# Patient Record
Sex: Female | Born: 1965 | Race: Black or African American | Hispanic: No | Marital: Single | Smoking: Former smoker
Health system: Southern US, Community
[De-identification: ages and names within clinical notes are randomized; demographics above are authoritative.]

## PROBLEM LIST (undated history)

## (undated) DIAGNOSIS — J45909 Unspecified asthma, uncomplicated: Secondary | ICD-10-CM

## (undated) DIAGNOSIS — D219 Benign neoplasm of connective and other soft tissue, unspecified: Secondary | ICD-10-CM

## (undated) DIAGNOSIS — I639 Cerebral infarction, unspecified: Secondary | ICD-10-CM

## (undated) DIAGNOSIS — K219 Gastro-esophageal reflux disease without esophagitis: Secondary | ICD-10-CM

## (undated) DIAGNOSIS — E785 Hyperlipidemia, unspecified: Secondary | ICD-10-CM

## (undated) DIAGNOSIS — D3912 Neoplasm of uncertain behavior of left ovary: Secondary | ICD-10-CM

## (undated) DIAGNOSIS — I10 Essential (primary) hypertension: Secondary | ICD-10-CM

## (undated) DIAGNOSIS — B009 Herpesviral infection, unspecified: Secondary | ICD-10-CM

## (undated) DIAGNOSIS — D649 Anemia, unspecified: Secondary | ICD-10-CM

## (undated) DIAGNOSIS — Z973 Presence of spectacles and contact lenses: Secondary | ICD-10-CM

## (undated) HISTORY — DX: Herpesviral infection, unspecified: B00.9

## (undated) HISTORY — DX: Hyperlipidemia, unspecified: E78.5

## (undated) HISTORY — DX: Anemia, unspecified: D64.9

## (undated) HISTORY — PX: UPPER GASTROINTESTINAL ENDOSCOPY: SHX188

## (undated) HISTORY — DX: Neoplasm of uncertain behavior of left ovary: D39.12

## (undated) HISTORY — DX: Unspecified asthma, uncomplicated: J45.909

## (undated) HISTORY — PX: BREAST BIOPSY: SHX20

## (undated) HISTORY — PX: TUBAL LIGATION: SHX77

---

## 1989-08-17 HISTORY — PX: TUBAL LIGATION: SHX77

## 2001-12-23 ENCOUNTER — Emergency Department (HOSPITAL_COMMUNITY): Admission: EM | Admit: 2001-12-23 | Discharge: 2001-12-23 | Payer: Self-pay | Admitting: Emergency Medicine

## 2007-10-31 ENCOUNTER — Emergency Department (HOSPITAL_COMMUNITY): Admission: EM | Admit: 2007-10-31 | Discharge: 2007-10-31 | Payer: Self-pay | Admitting: Emergency Medicine

## 2010-06-13 ENCOUNTER — Emergency Department (HOSPITAL_BASED_OUTPATIENT_CLINIC_OR_DEPARTMENT_OTHER)
Admission: EM | Admit: 2010-06-13 | Discharge: 2010-06-13 | Payer: Self-pay | Source: Home / Self Care | Admitting: Emergency Medicine

## 2010-06-13 ENCOUNTER — Ambulatory Visit: Payer: Self-pay | Admitting: Diagnostic Radiology

## 2010-08-17 DIAGNOSIS — D249 Benign neoplasm of unspecified breast: Secondary | ICD-10-CM | POA: Insufficient documentation

## 2010-09-07 ENCOUNTER — Encounter: Payer: Self-pay | Admitting: Obstetrics and Gynecology

## 2010-10-29 LAB — CBC
MCH: 27.8 pg (ref 26.0–34.0)
MCHC: 33.5 g/dL (ref 30.0–36.0)
MCV: 83 fL (ref 78.0–100.0)
Platelets: 379 10*3/uL (ref 150–400)
RBC: 4.31 MIL/uL (ref 3.87–5.11)
RDW: 15.7 % — ABNORMAL HIGH (ref 11.5–15.5)

## 2010-10-29 LAB — COMPREHENSIVE METABOLIC PANEL
Albumin: 4.3 g/dL (ref 3.5–5.2)
BUN: 10 mg/dL (ref 6–23)
Calcium: 9.6 mg/dL (ref 8.4–10.5)
Chloride: 107 mEq/L (ref 96–112)
Creatinine, Ser: 0.7 mg/dL (ref 0.4–1.2)
GFR calc Af Amer: >60 mL/min (ref >60)
Total Bilirubin: 0.3 mg/dL (ref 0.3–1.2)

## 2010-10-29 LAB — POCT CARDIAC MARKERS
CKMB, poc: <1 ng/mL — ABNORMAL LOW (ref 1.0–8.0)
Myoglobin, poc: 16.3 ng/mL (ref 12–200)

## 2010-10-29 LAB — DIFFERENTIAL
Basophils Absolute: 0 10*3/uL (ref 0.0–0.1)
Lymphocytes Relative: 36 % (ref 12–46)
Lymphs Abs: 3.5 10*3/uL (ref 0.7–4.0)
Monocytes Absolute: 0.5 10*3/uL (ref 0.1–1.0)
Neutro Abs: 5.9 10*3/uL (ref 1.7–7.7)

## 2010-10-29 LAB — D-DIMER, QUANTITATIVE: D-Dimer, Quant: 0.35 ug/mL-FEU (ref 0.00–0.48)

## 2011-05-06 ENCOUNTER — Other Ambulatory Visit: Payer: Self-pay | Admitting: Obstetrics and Gynecology

## 2011-05-06 DIAGNOSIS — R928 Other abnormal and inconclusive findings on diagnostic imaging of breast: Secondary | ICD-10-CM

## 2011-05-11 LAB — DIFFERENTIAL
Basophils Absolute: 0
Basophils Relative: 1
Eosinophils Absolute: 0
Neutrophils Relative %: 67

## 2011-05-11 LAB — BASIC METABOLIC PANEL
CO2: 24
Calcium: 8.9
Creatinine, Ser: 0.63
GFR calc non Af Amer: >60
Glucose, Bld: 85

## 2011-05-11 LAB — CBC
MCHC: 30.5
Platelets: 350
RDW: 18.9 — ABNORMAL HIGH

## 2011-05-15 ENCOUNTER — Ambulatory Visit
Admission: RE | Admit: 2011-05-15 | Discharge: 2011-05-15 | Disposition: A | Discharge status: Home / Self Care | Payer: PRIVATE HEALTH INSURANCE | Source: Ambulatory Visit | Attending: Obstetrics and Gynecology | Admitting: Obstetrics and Gynecology

## 2011-05-15 DIAGNOSIS — R928 Other abnormal and inconclusive findings on diagnostic imaging of breast: Secondary | ICD-10-CM

## 2011-12-25 ENCOUNTER — Other Ambulatory Visit: Payer: Self-pay | Admitting: Obstetrics and Gynecology

## 2011-12-25 DIAGNOSIS — N63 Unspecified lump in unspecified breast: Secondary | ICD-10-CM

## 2011-12-31 ENCOUNTER — Other Ambulatory Visit: Payer: Self-pay | Admitting: Obstetrics and Gynecology

## 2011-12-31 ENCOUNTER — Ambulatory Visit
Admission: RE | Admit: 2011-12-31 | Discharge: 2011-12-31 | Disposition: A | Discharge status: Home / Self Care | Payer: PRIVATE HEALTH INSURANCE | Source: Ambulatory Visit | Attending: Obstetrics and Gynecology | Admitting: Obstetrics and Gynecology

## 2011-12-31 DIAGNOSIS — N63 Unspecified lump in unspecified breast: Secondary | ICD-10-CM

## 2012-01-06 ENCOUNTER — Other Ambulatory Visit: Payer: Self-pay | Admitting: Obstetrics and Gynecology

## 2012-01-06 ENCOUNTER — Ambulatory Visit
Admission: RE | Admit: 2012-01-06 | Discharge: 2012-01-06 | Disposition: A | Discharge status: Home / Self Care | Payer: PRIVATE HEALTH INSURANCE | Source: Ambulatory Visit | Attending: Obstetrics and Gynecology | Admitting: Obstetrics and Gynecology

## 2012-01-06 DIAGNOSIS — N63 Unspecified lump in unspecified breast: Secondary | ICD-10-CM

## 2012-05-20 ENCOUNTER — Other Ambulatory Visit: Payer: Self-pay

## 2014-04-26 ENCOUNTER — Encounter (HOSPITAL_COMMUNITY): Payer: Self-pay | Admitting: Emergency Medicine

## 2014-04-26 ENCOUNTER — Emergency Department (HOSPITAL_COMMUNITY)
Admission: EM | Admit: 2014-04-26 | Discharge: 2014-04-26 | Disposition: A | Discharge status: Other Acute Inpatient Hospital | Payer: Commercial Managed Care - PPO | Source: Home / Self Care | Attending: Family Medicine | Admitting: Family Medicine

## 2014-04-26 ENCOUNTER — Emergency Department (HOSPITAL_COMMUNITY)
Admission: EM | Admit: 2014-04-26 | Discharge: 2014-04-26 | Disposition: A | Discharge status: Home / Self Care | Payer: Commercial Managed Care - PPO | Attending: Emergency Medicine | Admitting: Emergency Medicine

## 2014-04-26 DIAGNOSIS — R0789 Other chest pain: Secondary | ICD-10-CM

## 2014-04-26 DIAGNOSIS — D649 Anemia, unspecified: Secondary | ICD-10-CM | POA: Insufficient documentation

## 2014-04-26 DIAGNOSIS — R42 Dizziness and giddiness: Secondary | ICD-10-CM | POA: Diagnosis not present

## 2014-04-26 DIAGNOSIS — R062 Wheezing: Secondary | ICD-10-CM | POA: Insufficient documentation

## 2014-04-26 DIAGNOSIS — Z791 Long term (current) use of non-steroidal anti-inflammatories (NSAID): Secondary | ICD-10-CM | POA: Diagnosis not present

## 2014-04-26 DIAGNOSIS — I10 Essential (primary) hypertension: Secondary | ICD-10-CM | POA: Diagnosis not present

## 2014-04-26 DIAGNOSIS — F17200 Nicotine dependence, unspecified, uncomplicated: Secondary | ICD-10-CM | POA: Insufficient documentation

## 2014-04-26 DIAGNOSIS — Z8542 Personal history of malignant neoplasm of other parts of uterus: Secondary | ICD-10-CM | POA: Diagnosis not present

## 2014-04-26 HISTORY — DX: Benign neoplasm of connective and other soft tissue, unspecified: D21.9

## 2014-04-26 LAB — COMPREHENSIVE METABOLIC PANEL
ALT: 10 U/L (ref 0–35)
AST: 17 U/L (ref 0–37)
Albumin: 3.9 g/dL (ref 3.5–5.2)
Alkaline Phosphatase: 43 U/L (ref 39–117)
Anion gap: 16 — ABNORMAL HIGH (ref 5–15)
BUN: 7 mg/dL (ref 6–23)
CO2: 20 mEq/L (ref 19–32)
Calcium: 8.8 mg/dL (ref 8.4–10.5)
Chloride: 104 mEq/L (ref 96–112)
Creatinine, Ser: 0.45 mg/dL — ABNORMAL LOW (ref 0.50–1.10)
GFR calc Af Amer: >90 mL/min (ref >90)
GFR calc non Af Amer: >90 mL/min (ref >90)
Glucose, Bld: 86 mg/dL (ref 70–99)
Potassium: 3.6 mEq/L — ABNORMAL LOW (ref 3.7–5.3)
Sodium: 140 mEq/L (ref 137–147)
Total Bilirubin: 0.2 mg/dL — ABNORMAL LOW (ref 0.3–1.2)
Total Protein: 7.5 g/dL (ref 6.0–8.3)

## 2014-04-26 LAB — URINALYSIS, ROUTINE W REFLEX MICROSCOPIC
Bilirubin Urine: NEGATIVE
Glucose, UA: NEGATIVE mg/dL
Ketones, ur: 15 mg/dL — AB
Nitrite: NEGATIVE
Protein, ur: NEGATIVE mg/dL
Specific Gravity, Urine: 1.012 (ref 1.005–1.030)
Urobilinogen, UA: 0.2 mg/dL (ref 0.0–1.0)
pH: 6 (ref 5.0–8.0)

## 2014-04-26 LAB — POCT I-STAT, CHEM 8
BUN: 6 mg/dL (ref 6–23)
CHLORIDE: 106 meq/L (ref 96–112)
Calcium, Ion: 1.14 mmol/L (ref 1.12–1.23)
Creatinine, Ser: 0.5 mg/dL (ref 0.50–1.10)
GLUCOSE: 99 mg/dL (ref 70–99)
HEMATOCRIT: 26 % — AB (ref 36.0–46.0)
Hemoglobin: 8.8 g/dL — ABNORMAL LOW (ref 12.0–15.0)
POTASSIUM: 3.6 meq/L — AB (ref 3.7–5.3)
Sodium: 135 mEq/L — ABNORMAL LOW (ref 137–147)
TCO2: 22 mmol/L (ref 0–100)

## 2014-04-26 LAB — CBC WITH DIFFERENTIAL/PLATELET
Basophils Absolute: 0.1 10*3/uL (ref 0.0–0.1)
Basophils Relative: 1 % (ref 0–1)
Eosinophils Absolute: 0.1 10*3/uL (ref 0.0–0.7)
Eosinophils Relative: 1 % (ref 0–5)
HCT: 26.6 % — ABNORMAL LOW (ref 36.0–46.0)
Hemoglobin: 7.3 g/dL — ABNORMAL LOW (ref 12.0–15.0)
Lymphocytes Relative: 29 % (ref 12–46)
Lymphs Abs: 2.4 10*3/uL (ref 0.7–4.0)
MCH: 18 pg — ABNORMAL LOW (ref 26.0–34.0)
MCHC: 27.4 g/dL — ABNORMAL LOW (ref 30.0–36.0)
MCV: 65.5 fL — ABNORMAL LOW (ref 78.0–100.0)
Monocytes Absolute: 0.4 10*3/uL (ref 0.1–1.0)
Monocytes Relative: 5 % (ref 3–12)
Neutro Abs: 5.3 10*3/uL (ref 1.7–7.7)
Neutrophils Relative %: 64 % (ref 43–77)
Platelets: 243 10*3/uL (ref 150–400)
RBC: 4.06 MIL/uL (ref 3.87–5.11)
RDW: 18.6 % — ABNORMAL HIGH (ref 11.5–15.5)
WBC: 8.3 10*3/uL (ref 4.0–10.5)

## 2014-04-26 LAB — POCT URINALYSIS DIP (DEVICE)
Bilirubin Urine: NEGATIVE
GLUCOSE, UA: NEGATIVE mg/dL
KETONES UR: NEGATIVE mg/dL
Nitrite: NEGATIVE
Protein, ur: NEGATIVE mg/dL
SPECIFIC GRAVITY, URINE: 1.02 (ref 1.005–1.030)
Urobilinogen, UA: 0.2 mg/dL (ref 0.0–1.0)
pH: 6 (ref 5.0–8.0)

## 2014-04-26 LAB — URINE MICROSCOPIC-ADD ON

## 2014-04-26 MED ORDER — MECLIZINE HCL 25 MG PO TABS
25.0000 mg | ORAL_TABLET | Freq: Once | ORAL | Status: AC
  Administered 2014-04-26: 25 mg via ORAL
  Filled 2014-04-26: qty 1

## 2014-04-26 MED ORDER — FERROUS SULFATE 325 (65 FE) MG PO TABS: 325.0000 mg | ORAL_TABLET | Freq: Every day | ORAL | Status: DC

## 2014-04-26 MED ORDER — MECLIZINE HCL 25 MG PO TABS: 25.0000 mg | ORAL_TABLET | Freq: Three times a day (TID) | ORAL | Status: DC | PRN

## 2014-04-26 MED ORDER — SODIUM CHLORIDE 0.9 % IV BOLUS (SEPSIS)
1000.0000 mL | Freq: Once | INTRAVENOUS | Status: AC
  Administered 2014-04-26: 1000 mL via INTRAVENOUS

## 2014-04-26 NOTE — ED Provider Notes (Signed)
Medical screening examination/treatment/procedure(s) were conducted as a shared visit with non-physician practitioner(s) and myself.  I personally evaluated the patient during the encounter.   EKG Interpretation None      Pt is a 48 y.o. female who presents emergency department with vertigo and mild shortness of breath. She is found to be anemic with a hemoglobin of 7.3. Likely secondary to heavy menstrual cycles because of fibroids. She is not having active bleeding. No bloody stool or melena. She is hemodynamically stable and asymptomatic in the ED. discuss with her the options of transfusion versus close followup and starting her on iron tablets. She does not want blood products at this time and would prefer outpatient followup. Discussed strict return precautions. I feel this is a reasonable plan and she is hemodynamically stable, well-appearing, nontoxic, no active bleeding. She is not on anticoagulation.  [place], DO 04/26/14 2025

## 2014-04-26 NOTE — ED Notes (Signed)
Provider at bedside

## 2014-04-26 NOTE — Discharge Instructions (Signed)
Return here as needed.  Follow-up with the clinic provided.  °

## 2014-04-26 NOTE — ED Provider Notes (Signed)
CSN: [id number]     Arrival date & time 04/26/14  1221 History   First MD Initiated Contact with Patient 04/26/14 1548     Chief Complaint  Patient presents with  . Anemia  . Dizziness   HPI 48 year old female with a history of anemia and untreated borderline HTN presents from Urgent Care for evaluation of dizziness and vertigo. Reports symptoms started 3 days ago upon waking up, described as "room spinning", feeling unbalanced, and mild nausea. Symptoms occur intermittently, especially with position changes of sitting up or getting up, at least once per hour and have been increasing in intensity. Accompanied by moderate, occasional chest pain that moves from right to left, mild SOB and palpitations, blurry vision and black spots. Reports vomiting once yesterday, but denies any hematemesis. Also endorses fatigue in the recent months and occasional lightheadedness. Denies any edema, diaphoresis, fever, or change in headaches. Treated the dizziness with sudafed and ibuprofen, without any relief. Chronic medical conditions include mild (per patient) hypertension that is not treated, and history of anemia (no CBC checked in the past 2 years). LMP on 08/21. Admits to smoking for 3 years and daily alcohol intake of 12 oz of beer. Denies any recreational drug use.  Past Medical History  Diagnosis Date  . Fibroids    History reviewed. No pertinent past surgical history. History reviewed. No pertinent family history. History  Substance Use Topics  . Smoking status: Current Every Day Smoker  . Smokeless tobacco: Not on file  . Alcohol Use: Yes   OB History   Grav Para Term Preterm Abortions TAB SAB Ect Mult Living                 Review of Systems  Review of systems is negative except as noted in HPI.   Allergies  Review of patient's allergies indicates no known allergies.  Home Medications   Prior to Admission medications   Medication Sig Start Date End Date Taking? Authorizing Provider   Ibuprofen-Diphenhydramine HCl (ADVIL PM) 200-25 MG CAPS Take by mouth.   Yes Historical Provider, MD  pseudoephedrine (SUDAFED) 30 MG tablet Take 30 mg by mouth every 4 (four) hours as needed for congestion.   Yes Historical Provider, MD   BP 152/89  Pulse 77  Temp(Src) 98.1 F (36.7 C) (Oral)  Resp 18  SpO2 100%  LMP 04/11/2014 Physical Exam  Nursing note and vitals reviewed. Constitutional: She is oriented to person, place, and time. She appears well-developed and well-nourished. No distress.  HENT:  Head: Normocephalic and atraumatic.  Mouth/Throat: Oropharynx is clear and moist.  Eyes: EOM are normal. Pupils are equal, round, and reactive to light.  Neck: Neck supple. No JVD present.  Cardiovascular: Normal rate, regular rhythm and intact distal pulses.  Exam reveals no gallop and no friction rub.   No murmur heard. No peripheral edema.   Pulmonary/Chest: Effort normal. She has wheezes in the right lower field and the left lower field.  Soft inspiratory wheezes lower lung fields bilaterally.   Lymphadenopathy:    She has no cervical adenopathy.  Neurological: She is alert and oriented to person, place, and time. No cranial nerve deficit.  Skin: Skin is warm and dry. She is not diaphoretic.    ED Course  Procedures (including critical care time) Labs Review Labs Reviewed  URINE CULTURE  CBC WITH DIFFERENTIAL  COMPREHENSIVE METABOLIC PANEL  URINALYSIS, ROUTINE W REFLEX MICROSCOPIC    The patient would like to be discharged home  at this time.  She does not want to be admitted to the hospital.  I feel that her vertigo is not definitely related to her anemia, but certainly could be related and I advised her of this.  Patient still like to go home and followup on outpatient basis.  Patient agrees with plan and all questions were answered.  Advised the patient to return here for worsening in her condition and she voices an understanding and agrees to that plan as  well  Brent General, PA-C 04/26/14 2058

## 2014-04-26 NOTE — ED Notes (Signed)
Pt    Reports   Symptoms  Of  Dizzy     No  Pain        Vomited  X  1  Yesterday            Sitting upright  On the  Exam table  Speaking  In  Complete  sentances

## 2014-04-26 NOTE — ED Provider Notes (Signed)
CSN: [id number]     Arrival date & time 04/26/14  1006 History   First MD Initiated Contact with Patient 04/26/14 1029     Chief Complaint  Patient presents with  . Dizziness   (Consider location/radiation/quality/duration/timing/severity/associated sxs/prior Treatment) HPI Comments: Patient reports brief episodes of dizziness with symptoms described as room spinning with changes in head position. States symptoms are most noticeable when transitioning from lying flat to sitting, from sitting to standing or is she bends over to pick up object off the floor. Denies GU or GI sx, including melena.  LNMP: 04/06/2014 PCP: none GYN: none Is a smoker Works as Company secretary at [employer] Also mentions occasional episodes (1-2 x week) of left chest discomfort that radiates to her left upper back and is associated with mild dyspnea and a sense of heartburn. States episodes of hearburn have become more frequent over past few weeks. Denies nausea or diaphoresis. These episodes often last several hours. Last episode was 1-2 days ago.  No family hx of early heart disease. At the time of today's hx and exam, patient is without dizziness or chest discomfort.     Patient is a 48 y.o. female presenting with dizziness. The history is provided by the patient.  Dizziness Quality:  Room spinning Severity:  Mild Onset quality:  Gradual Duration:  3 days Timing:  Intermittent Progression:  Waxing and waning Chronicity:  New Context: bending over, head movement and standing up   Context: not with bowel movement, not with ear pain, not with eye movement, not with inactivity, not with loss of consciousness, not with medication, not with physical activity and not when urinating   Relieved by:  Being still Worsened by:  Standing up and sitting upright Ineffective treatments:  None tried Associated symptoms: chest pain and shortness of breath   Associated symptoms: no blood in stool, no diarrhea, no  headaches, no hearing loss, no nausea, no palpitations, no syncope, no tinnitus, no vision changes, no vomiting and no weakness   Associated symptoms comment:  +heavy menses secondary to uterine fibroids   History reviewed. No pertinent past medical history. History reviewed. No pertinent past surgical history. History reviewed. No pertinent family history. History  Substance Use Topics  . Smoking status: Never Smoker   . Smokeless tobacco: Not on file  . Alcohol Use: Yes   OB History   Grav Para Term Preterm Abortions TAB SAB Ect Mult Living                 Review of Systems  Constitutional: Negative.   HENT: Negative for hearing loss and tinnitus.   Eyes: Negative.   Respiratory: Positive for chest tightness and shortness of breath.   Cardiovascular: Positive for chest pain. Negative for palpitations and syncope.  Gastrointestinal: Negative for nausea, vomiting, diarrhea and blood in stool.  Endocrine: Negative for polydipsia, polyphagia and polyuria.  Genitourinary: Negative.   Musculoskeletal: Negative.   Skin: Negative.   Neurological: Positive for dizziness and light-headedness. Negative for tremors, seizures, syncope, facial asymmetry, speech difficulty, weakness, numbness and headaches.  Hematological: Negative.   Psychiatric/Behavioral: Negative.     Allergies  Review of patient's allergies indicates no known allergies.  Home Medications   Prior to Admission medications   Not on File   BP 157/95  Pulse 90  Temp(Src) 98.7 F (37.1 C) (Oral)  Resp 16  SpO2 100%  LMP 04/11/2014 Physical Exam  Nursing note and vitals reviewed. Constitutional: She is oriented to person, place,  and time. She appears well-developed and well-nourished. No distress.  HENT:  Head: Normocephalic and atraumatic.  Right Ear: Hearing, tympanic membrane, external ear and ear canal normal.  Left Ear: Hearing, tympanic membrane, external ear and ear canal normal.  Mouth/Throat:  Oropharynx is clear and moist.  Eyes: Conjunctivae are normal. No scleral icterus.  Cardiovascular: Normal rate, regular rhythm and normal heart sounds.   No murmur heard. Pulmonary/Chest: Effort normal and breath sounds normal.  Abdominal: Soft. Bowel sounds are normal. She exhibits no distension. There is no tenderness.  Musculoskeletal: Normal range of motion.  Neurological: She is alert and oriented to person, place, and time. She has normal strength. No cranial nerve deficit or sensory deficit. Coordination and gait normal. GCS eye subscore is 4. GCS verbal subscore is 5. GCS motor subscore is 6.  Skin: Skin is warm and dry. No rash noted. She is not diaphoretic. No erythema.  Psychiatric: She has a normal mood and affect. Her behavior is normal.    ED Course  Procedures (including critical care time) Labs Review Labs Reviewed  POCT I-STAT, CHEM 8 - Abnormal; Notable for the following:    Sodium 135 (*)    Potassium 3.6 (*)    Hemoglobin 8.8 (*)    HCT 26.0 (*)    All other components within normal limits  POCT URINALYSIS DIP (DEVICE) - Abnormal; Notable for the following:    Hgb urine dipstick TRACE (*)    Leukocytes, UA SMALL (*)    All other components within normal limits    Imaging Review No results found.   MDM   1. Symptomatic anemia   2. Chest discomfort    48 y/o AA female with symptomatic anemia and complaints of intermittent chest discomfort and hx of HTN, tobacco abuse.  ECG: NSR @ 89 bpm and without acute ST/T wave changes or ectopy.  UA with trace LE and trace HGb Istat 8 with moderate anemia (H/H=8.8/26)??result of heavy menses?? Unfortunately this patient is not established with primary care or Gyn providers in community and I am concerned about access to prompt outpatient follow up for further investigation of anemia and completion of risk stratification with regard to issues of chest discomfort. I have some concern that anemia may be contributing to  chest discomfort. Therefore, after discussing case with Dr. Georgina Snell, decision to transfer patient (via shuttle) to [facility] to complete basic cardiopulmonary evaluation (??CXR, ??cardiac markers) and determine if patient requires transfusion vs outpatient iron therapy.       Lutricia Feil, [place] 04/26/14 1224

## 2014-04-26 NOTE — ED Notes (Signed)
Pt sent here from [facility] for further eval of dizziness x 3 days and anemia; pt sts hx of same with fibroids

## 2014-04-26 NOTE — ED Notes (Signed)
Pt ambulated in the hallway without any c/o dizziness or SOB

## 2014-04-26 NOTE — ED Provider Notes (Signed)
Medical screening examination/treatment/procedure(s) were performed by non-physician practitioner and as supervising physician I was immediately available for consultation/collaboration.   EKG Interpretation None        [place], DO 04/26/14 2334

## 2014-04-27 LAB — URINE CULTURE: Colony Count: 50000

## 2014-04-28 NOTE — ED Provider Notes (Signed)
Medical screening examination/treatment/procedure(s) were performed by a resident physician or non-physician practitioner and as the supervising physician I was immediately available for consultation/collaboration.  Lynne Leader, MD    Gregor Hams, MD 04/28/14 [phone]

## 2015-06-04 ENCOUNTER — Emergency Department (HOSPITAL_BASED_OUTPATIENT_CLINIC_OR_DEPARTMENT_OTHER): Payer: Commercial Managed Care - PPO

## 2015-06-04 ENCOUNTER — Emergency Department (HOSPITAL_BASED_OUTPATIENT_CLINIC_OR_DEPARTMENT_OTHER)
Admission: EM | Admit: 2015-06-04 | Discharge: 2015-06-04 | Disposition: A | Discharge status: Home / Self Care | Payer: Commercial Managed Care - PPO | Attending: Emergency Medicine | Admitting: Emergency Medicine

## 2015-06-04 DIAGNOSIS — R0602 Shortness of breath: Secondary | ICD-10-CM | POA: Insufficient documentation

## 2015-06-04 DIAGNOSIS — Z79899 Other long term (current) drug therapy: Secondary | ICD-10-CM | POA: Insufficient documentation

## 2015-06-04 DIAGNOSIS — D649 Anemia, unspecified: Secondary | ICD-10-CM | POA: Insufficient documentation

## 2015-06-04 DIAGNOSIS — Z720 Tobacco use: Secondary | ICD-10-CM | POA: Insufficient documentation

## 2015-06-04 DIAGNOSIS — Z86018 Personal history of other benign neoplasm: Secondary | ICD-10-CM | POA: Insufficient documentation

## 2015-06-04 DIAGNOSIS — R0789 Other chest pain: Secondary | ICD-10-CM | POA: Insufficient documentation

## 2015-06-04 LAB — CBC WITH DIFFERENTIAL/PLATELET
BASOS PCT: 0 %
Basophils Absolute: 0 10*3/uL (ref 0.0–0.1)
Eosinophils Absolute: 0 10*3/uL (ref 0.0–0.7)
Eosinophils Relative: 0 %
HEMATOCRIT: 35.2 % — AB (ref 36.0–46.0)
Hemoglobin: 11.9 g/dL — ABNORMAL LOW (ref 12.0–15.0)
Lymphocytes Relative: 16 %
Lymphs Abs: 1.7 10*3/uL (ref 0.7–4.0)
MCH: 30.6 pg (ref 26.0–34.0)
MCHC: 33.8 g/dL (ref 30.0–36.0)
MCV: 90.5 fL (ref 78.0–100.0)
Monocytes Absolute: 0.5 10*3/uL (ref 0.1–1.0)
Monocytes Relative: 5 %
NEUTROS ABS: 8.2 10*3/uL — AB (ref 1.7–7.7)
NEUTROS PCT: 79 %
PLATELETS: 268 10*3/uL (ref 150–400)
RBC: 3.89 MIL/uL (ref 3.87–5.11)
RDW: 12.9 % (ref 11.5–15.5)
WBC: 10.4 10*3/uL (ref 4.0–10.5)

## 2015-06-04 LAB — BASIC METABOLIC PANEL
Anion gap: 8 (ref 5–15)
BUN: 9 mg/dL (ref 6–20)
CO2: 25 mmol/L (ref 22–32)
Calcium: 9.1 mg/dL (ref 8.9–10.3)
Chloride: 104 mmol/L (ref 101–111)
Creatinine, Ser: 0.52 mg/dL (ref 0.44–1.00)
GFR calc Af Amer: >60 mL/min (ref >60)
Glucose, Bld: 103 mg/dL — ABNORMAL HIGH (ref 65–99)
POTASSIUM: 3.2 mmol/L — AB (ref 3.5–5.1)
Sodium: 137 mmol/L (ref 135–145)

## 2015-06-04 LAB — TROPONIN I: Troponin I: <0.03 ng/mL (ref <0.031)

## 2015-06-04 MED ORDER — ALBUTEROL SULFATE HFA 108 (90 BASE) MCG/ACT IN AERS
1.0000 | INHALATION_SPRAY | Freq: Four times a day (QID) | RESPIRATORY_TRACT | Status: DC | PRN
  Administered 2015-06-04: 1 via RESPIRATORY_TRACT
  Filled 2015-06-04: qty 6.7

## 2015-06-04 NOTE — ED Notes (Signed)
Pt reports chest tightness that has been on going for several month with symptoms that are more frequent and increasing tightness with last episode tonight at work.

## 2015-06-04 NOTE — ED Provider Notes (Signed)
CSN: [id number]     Arrival date & time 06/04/15  4742 History   First MD Initiated Contact with Patient 06/04/15 0703     Chief Complaint  Patient presents with  . Chest Pain    tightness     (Consider location/radiation/quality/duration/timing/severity/associated sxs/prior Treatment) HPI Comments: Patient is a 49 year old female with a history of anemia due to uterine fibroids who is currently on iron and recently stopped smoking approximately 2 months ago who presents today with ongoing intermittent chest tightness and shortness of breath. This has been going on for the last 2 months and gradually worsening. It seems to be worse with activity and exertion and better with sitting down and resting. She describes it as a tightness throughout the central part of the chest without radiation that can last anywhere between 2 and 15 minutes. Sometimes she gets shortness of breath with it but not always. No vomiting or diaphoresis. Patient is not currently having pain. She decided to come in today because she felt like the tightness was worse at work last night than normal. She also notes today that she might be coming down with a cold that she feels more congested and has a slight cough. However over the last few months she denies cold-like symptoms. No prior history of cardiac issues. No family history of heart disease or MI. Patient denies any recent immobilization, surgeries or long travel. No unilateral leg pain or swelling  Patient is a 49 y.o. female presenting with chest pain. The history is provided by the patient.  Chest Pain   Past Medical History  Diagnosis Date  . Fibroids    No past surgical history on file. No family history on file. Social History  Substance Use Topics  . Smoking status: Current Every Day Smoker  . Smokeless tobacco: Not on file  . Alcohol Use: Yes   OB History    No data available     Review of Systems  Cardiovascular: Positive for chest pain.  All  other systems reviewed and are negative.     Allergies  Review of patient's allergies indicates no known allergies.  Home Medications   Prior to Admission medications   Medication Sig Start Date End Date Taking? Authorizing Provider  ferrous sulfate 325 (65 FE) MG tablet Take 1 tablet (325 mg total) by mouth daily. 04/26/14   Christopher Lawyer, PA-C  Ibuprofen-Diphenhydramine HCl (ADVIL PM) 200-25 MG CAPS Take by mouth.    Historical Provider, MD  meclizine (ANTIVERT) 25 MG tablet Take 1 tablet (25 mg total) by mouth 3 (three) times daily as needed for dizziness. 04/26/14   Dalia Heading, PA-C  pseudoephedrine (SUDAFED) 30 MG tablet Take 30 mg by mouth every 4 (four) hours as needed for congestion.    Historical Provider, MD   BP 159/87 mmHg  Pulse 71  Temp(Src) 98.4 F (36.9 C) (Oral)  Resp 17  SpO2 100%  LMP 05/14/2015 Physical Exam  Constitutional: She is oriented to person, place, and time. She appears well-developed and well-nourished. No distress.  HENT:  Head: Normocephalic and atraumatic.  Mouth/Throat: Oropharynx is clear and moist.  Eyes: Conjunctivae and EOM are normal. Pupils are equal, round, and reactive to light.  Neck: Normal range of motion. Neck supple.  Cardiovascular: Normal rate, regular rhythm and intact distal pulses.   No murmur heard. Pulmonary/Chest: Effort normal and breath sounds normal. No respiratory distress. She has no wheezes. She has no rales. She exhibits no tenderness.  Abdominal: Soft. She exhibits  no distension. There is no tenderness. There is no rebound and no guarding.  Musculoskeletal: Normal range of motion. She exhibits no edema or tenderness.  No calf tenderness bilaterally or swelling  Neurological: She is alert and oriented to person, place, and time.  Skin: Skin is warm and dry. No rash noted. No erythema.  Psychiatric: She has a normal mood and affect. Her behavior is normal.  Nursing note and vitals reviewed.   ED Course   Procedures (including critical care time) Labs Review Labs Reviewed  CBC WITH DIFFERENTIAL/PLATELET - Abnormal; Notable for the following:    Hemoglobin 11.9 (*)    HCT 35.2 (*)    Neutro Abs 8.2 (*)    All other components within normal limits  BASIC METABOLIC PANEL - Abnormal; Notable for the following:    Potassium 3.2 (*)    Glucose, Bld 103 (*)    All other components within normal limits  TROPONIN I    Imaging Review Dg Chest 2 View  06/04/2015  CLINICAL DATA:  Chest pain and short of breath EXAM: CHEST  2 VIEW COMPARISON:  06/13/2010 FINDINGS: The heart size and mediastinal contours are within normal limits. Both lungs are clear. The visualized skeletal structures are unremarkable. IMPRESSION: No active cardiopulmonary disease. Electronically Signed   By: Franchot Gallo M.D.   On: 06/04/2015 07:50   I have personally reviewed and evaluated these images and lab results as part of my medical decision-making.   EKG Interpretation   Date/Time:  Tuesday June 04 2015 06:42:07 EDT Ventricular Rate:  73 PR Interval:  160 QRS Duration: 68 QT Interval:  382 QTC Calculation: 420 R Axis:   12 Text Interpretation:  Normal sinus rhythm Anterior infarct , age  undetermined Abnormal ECG No significant change since Confirmed by MOLPUS   MD, Jenny Reichmann ([id number]) on 06/04/2015 6:46:43 AM      MDM   Final diagnoses:  Atypical chest pain    Patient is a 49 year old female presenting today for chest tightness that's been intermittent for the last 2 months with associated shortness of breath. Patient was a smoker however quit 2 months ago but feels that her symptoms are worsening. She has no prior cardiac history and no family history of cardiac disease. Her only risk factor is smoking. She does have a doctor but has not seen them in about a year and a half. She denies any history of hypertension, hyperlipidemia, diabetes.  She does have a former history of asthma but has not had to use an  inhaler in quite some time.  Today she is complaining of some mild URI symptoms however those have not been present over the last few months when she develops the chest tightness. Lungs are clear on exam today with no focal findings on exam.  Given patient's story concerning for possible cardiac etiology versus lung pathology such as early COPD from prior tobacco use and history of asthma vs gerd as some sx do occur if she eats a big meal and then lays down vs anemia.  Lower suspicion for pneumonia. Perc negative and HEART score of 1 for age (or 2 if you count risk factor of smoking due to recently quitting 2 months ago) which places the patient in a fairly low risk category.  EKG without acute findings and unchanged from prior. CBC, BMP, troponin, chest x-ray pending  8:28 AM Labs are without acute finding. Patient's anemia significantly improved. Troponin is negative. Discussed with patient the importance of following with  her PCP for stress testing and possible pulmonary function tests. I suspect this is most likely related more to lung pathology opposed to cardiac issues. Patient was given an inhaler here to use when she develops the tightness and shortness of breath to see if there is any improvement. Also she was given recommendations for diet changes and lifestyle changes to decrease GERD symptoms  Blanchie Dessert, MD 06/04/15 [phone]

## 2015-10-01 LAB — HM MAMMOGRAPHY

## 2015-10-15 ENCOUNTER — Encounter: Payer: Self-pay | Admitting: Family Medicine

## 2015-10-15 ENCOUNTER — Ambulatory Visit (INDEPENDENT_AMBULATORY_CARE_PROVIDER_SITE_OTHER): Payer: BLUE CROSS/BLUE SHIELD | Admitting: Family Medicine

## 2015-10-15 VITALS — BP 110/68 | HR 64 | Wt 193.0 lb

## 2015-10-15 DIAGNOSIS — E876 Hypokalemia: Secondary | ICD-10-CM

## 2015-10-15 DIAGNOSIS — K219 Gastro-esophageal reflux disease without esophagitis: Secondary | ICD-10-CM

## 2015-10-15 DIAGNOSIS — D500 Iron deficiency anemia secondary to blood loss (chronic): Secondary | ICD-10-CM

## 2015-10-15 DIAGNOSIS — J4520 Mild intermittent asthma, uncomplicated: Secondary | ICD-10-CM | POA: Diagnosis not present

## 2015-10-15 DIAGNOSIS — Z7189 Other specified counseling: Secondary | ICD-10-CM | POA: Diagnosis not present

## 2015-10-15 DIAGNOSIS — Z7689 Persons encountering health services in other specified circumstances: Secondary | ICD-10-CM

## 2015-10-15 MED ORDER — ALBUTEROL SULFATE HFA 108 (90 BASE) MCG/ACT IN AERS: 2.0000 | INHALATION_SPRAY | Freq: Four times a day (QID) | RESPIRATORY_TRACT | Status: DC | PRN

## 2015-10-15 NOTE — Patient Instructions (Addendum)
If you are having to use your albuterol inhaler more than 2 times during the day per month or more than once at nighttime per month let me know. Please keep a log of when you need the inhaler and let's try to find out what our asthma triggers are.  Take 65 mg of iron daily.  Potassium Content of Foods Potassium is a mineral found in many foods and drinks. It helps keep fluids and minerals balanced in your body and affects how steadily your heart beats. Potassium also helps control your blood pressure and keep your muscles and nervous system healthy. Certain health conditions and medicines may change the balance of potassium in your body. When this happens, you can help balance your level of potassium through the foods that you do or do not eat. Your health care provider or dietitian may recommend an amount of potassium that you should have each day. The following lists of foods provide the amount of potassium (in parentheses) per serving in each item. HIGH IN POTASSIUM  The following foods and beverages have 200 mg or more of potassium per serving:  Apricots, 2 raw or 5 dry (200 mg).  Artichoke, 1 medium (345 mg).  Avocado, raw,  each (245 mg).  Banana, 1 medium (425 mg).  Beans, lima, or baked beans, canned,  cup (280 mg).  Beans, white, canned,  cup (595 mg).  Beef roast, 3 oz (320 mg).  Beef, ground, 3 oz (270 mg).  Beets, raw or cooked,  cup (260 mg).  Bran muffin, 2 oz (300 mg).  Broccoli,  cup (230 mg).  Brussels sprouts,  cup (250 mg).  Cantaloupe,  cup (215 mg).  Cereal, 100% bran,  cup (200-400 mg).  Cheeseburger, single, fast food, 1 each (225-400 mg).  Chicken, 3 oz (220 mg).  Clams, canned, 3 oz (535 mg).  Crab, 3 oz (225 mg).  Dates, 5 each (270 mg).  Dried beans and peas,  cup (300-475 mg).  Figs, dried, 2 each (260 mg).  Fish: halibut, tuna, cod, snapper, 3 oz (480 mg).  Fish: salmon, haddock, swordfish, perch, 3 oz (300 mg).  Fish, tuna,  canned 3 oz (200 mg).  [place] fries, fast food, 3 oz (470 mg).  Granola with fruit and nuts,  cup (200 mg).  Grapefruit juice,  cup (200 mg).  Greens, beet,  cup (655 mg).  Honeydew melon,  cup (200 mg).  Kale, raw, 1 cup (300 mg).  Kiwi, 1 medium (240 mg).  Kohlrabi, rutabaga, parsnips,  cup (280 mg).  Lentils,  cup (365 mg).  Mango, 1 each (325 mg).  Milk, chocolate, 1 cup (420 mg).  Milk: nonfat, low-fat, whole, buttermilk, 1 cup (350-380 mg).  Molasses, 1 Tbsp (295 mg).  Mushrooms,  cup (280) mg.  Nectarine, 1 each (275 mg).  Nuts: almonds, peanuts, hazelnuts, [place], cashew, mixed, 1 oz (200 mg).  Nuts, pistachios, 1 oz (295 mg).  Orange, 1 each (240 mg).  Orange juice,  cup (235 mg).  Papaya, medium,  fruit (390 mg).  Peanut butter, chunky, 2 Tbsp (240 mg).  Peanut butter, smooth, 2 Tbsp (210 mg).  Pear, 1 medium (200 mg).  Pomegranate, 1 whole (400 mg).  Pomegranate juice,  cup (215 mg).  Pork, 3 oz (350 mg).  Potato chips, salted, 1 oz (465 mg).  Potato, baked with skin, 1 medium (925 mg).  Potatoes, boiled,  cup (255 mg).  Potatoes, mashed,  cup (330 mg).  Prune juice,  cup (370  mg).  Prunes, 5 each (305 mg).  Pudding, chocolate,  cup (230 mg).  Pumpkin, canned,  cup (250 mg).  Raisins, seedless,  cup (270 mg).  Seeds, sunflower or pumpkin, 1 oz (240 mg).  Soy milk, 1 cup (300 mg).  Spinach,  cup (420 mg).  Spinach, canned,  cup (370 mg).  Sweet potato, baked with skin, 1 medium (450 mg).  Swiss chard,  cup (480 mg).  Tomato or vegetable juice,  cup (275 mg).  Tomato sauce or puree,  cup (400-550 mg).  Tomato, raw, 1 medium (290 mg).  Tomatoes, canned,  cup (200-300 mg).  [place], 3 oz (250 mg).  Wheat germ, 1 oz (250 mg).  Winter squash,  cup (250 mg).  Yogurt, plain or fruited, 6 oz (260-435 mg).  Zucchini,  cup (220 mg). MODERATE IN POTASSIUM The following foods and beverages have  50-200 mg of potassium per serving:  Apple, 1 each (150 mg).  Apple juice,  cup (150 mg).  Applesauce,  cup (90 mg).  Apricot nectar,  cup (140 mg).  Asparagus, small spears,  cup or 6 spears (155 mg).  Bagel, cinnamon raisin, 1 each (130 mg).  Bagel, egg or plain, 4 in., 1 each (70 mg).  Beans, green,  cup (90 mg).  Beans, yellow,  cup (190 mg).  Beer, regular, 12 oz (100 mg).  Beets, canned,  cup (125 mg).  Blackberries,  cup (115 mg).  Blueberries,  cup (60 mg).  Bread, whole wheat, 1 slice (70 mg).  Broccoli, raw,  cup (145 mg).  Cabbage,  cup (150 mg).  Carrots, cooked or raw,  cup (180 mg).  Cauliflower, raw,  cup (150 mg).  Celery, raw,  cup (155 mg).  Cereal, bran flakes, cup (120-150 mg).  Cheese, cottage,  cup (110 mg).  Cherries, 10 each (150 mg).  Chocolate, 1 oz bar (165 mg).  Coffee, brewed 6 oz (90 mg).  Corn,  cup or 1 ear (195 mg).  Cucumbers,  cup (80 mg).  Egg, large, 1 each (60 mg).  Eggplant,  cup (60 mg).  Endive, raw, cup (80 mg).  English muffin, 1 each (65 mg).  Fish, orange roughy, 3 oz (150 mg).  Frankfurter, beef or pork, 1 each (75 mg).  Fruit cocktail,  cup (115 mg).  Grape juice,  cup (170 mg).  Grapefruit,  fruit (175 mg).  Grapes,  cup (155 mg).  Greens: kale, turnip, collard,  cup (110-150 mg).  Ice cream or frozen yogurt, chocolate,  cup (175 mg).  Ice cream or frozen yogurt, vanilla,  cup (120-150 mg).  Lemons, limes, 1 each (80 mg).  Lettuce, all types, 1 cup (100 mg).  Mixed vegetables,  cup (150 mg).  Mushrooms, raw,  cup (110 mg).  Nuts: walnuts, pecans, or macadamia, 1 oz (125 mg).  Oatmeal,  cup (80 mg).  Okra,  cup (110 mg).  Onions, raw,  cup (120 mg).  Peach, 1 each (185 mg).  Peaches, canned,  cup (120 mg).  Pears, canned,  cup (120 mg).  Peas, green, frozen,  cup (90 mg).  Peppers, green,  cup (130 mg).  Peppers, red,  cup (160  mg).  Pineapple juice,  cup (165 mg).  Pineapple, fresh or canned,  cup (100 mg).  Plums, 1 each (105 mg).  Pudding, vanilla,  cup (150 mg).  Raspberries,  cup (90 mg).  Rhubarb,  cup (115 mg).  Rice, wild,  cup (80 mg).  Shrimp, 3 oz (155  mg).  Spinach, raw, 1 cup (170 mg).  Strawberries,  cup (125 mg).  Summer squash  cup (175-200 mg).  Swiss chard, raw, 1 cup (135 mg).  Tangerines, 1 each (140 mg).  Tea, brewed, 6 oz (65 mg).  Turnips,  cup (140 mg).  Watermelon,  cup (85 mg).  Wine, red, table, 5 oz (180 mg).  Wine, white, table, 5 oz (100 mg). LOW IN POTASSIUM The following foods and beverages have less than 50 mg of potassium per serving.  Bread, white, 1 slice (30 mg).  Carbonated beverages, 12 oz (less than 5 mg).  Cheese, 1 oz (20-30 mg).  Cranberries,  cup (45 mg).  Cranberry juice cocktail,  cup (20 mg).  Fats and oils, 1 Tbsp (less than 5 mg).  Hummus, 1 Tbsp (32 mg).  Nectar: papaya, mango, or pear,  cup (35 mg).  Rice, white or brown,  cup (50 mg).  Spaghetti or macaroni,  cup cooked (30 mg).  Tortilla, flour or corn, 1 each (50 mg).  Waffle, 4 in., 1 each (50 mg).  Water chestnuts,  cup (40 mg).   This information is not intended to replace advice given to you by your health care provider. Make sure you discuss any questions you have with your health care provider.   Document Released: 03/17/2005 Document Revised: 08/08/2013 Document Reviewed: 06/30/2013 Elsevier Interactive Patient Education [employer].

## 2015-10-15 NOTE — Progress Notes (Signed)
Subjective:    Patient ID: Shari Gibson, female    DOB: 08-06-1966, 50 y.o.   MRN: [id number]  HPI Chief Complaint  Patient presents with  . new pt    new pt, last 5-6 days when she bends over she feels like a knot on her stomach.    She is new to the practice and here to establish primary care. Would like to address multiple issues today.  Complains of feeling short of breath sometimes and uses her daughters albuterol inhaler, typically 2 times per week. Reports history of childhood asthma but has not had medication for this. Denies exacerbation in many years. No ED visits for asthma.  Does not know her triggers. Sometimes it is associated with exercise but not always.  Former heavy smoker for 20 years. Quit a few months ago. Denies fever, chills, fatigue, chest pain, palpitations, cough, DOE.  Reports recent 10lb weight loss and has been dieting and exercising to lose this weight.   Also reports history of reflux and takes prilosec in the mornings for this.  She also complains of a "knot" that she feels in her left upper abdomen when she bends over for past 5-6 days. States it is not painful.  Other providers: Dr. Daphene Calamity at [facility].   Last physical exam and blood work: 09/2015 at GYN and everything normal.   quit smoking 03/2015. Smoked for 20 years. Quit cold [place].  Anemia- due to fibroids. She is seeing GYN for this and taking iron.  Was evaluated for chest pain in ED- no cardiac disease.   Surgeries: tubal ligation Family: adopted by Aunt. Diabetes, HTN.   Last pap: 09/2015 Mammogram: 09/2015 Eye exam: 08/2015  Dentist: Dr. Fabio Asa up to date  Single, 3 grown kids.  Works as a Insurance account manager- smoky environment. Does not wear a mask. 12 hour nights 3 days a week. Sleeps 2-3 hours a day. Takes advil P.M. On these days.  Alcohol 2 beers per day. Denies drug use.   Reviewed allergies, medications, past medical, surgical, family and social history.   Review  of Systems Pertinent positives and negatives in the history of present illness.     Objective:   Physical Exam  Constitutional: She is oriented to person, place, and time. She appears well-developed and well-nourished. No distress.  Cardiovascular: Normal rate, regular rhythm, normal heart sounds and intact distal pulses.   Pulmonary/Chest: Effort normal and breath sounds normal. She has no wheezes.  Abdominal: Soft. Bowel sounds are normal. She exhibits no distension and no mass. There is no tenderness. There is no rebound and no guarding.  Neurological: She is alert and oriented to person, place, and time.  Skin: Skin is warm and dry. No pallor.  Psychiatric: She has a normal mood and affect. Her behavior is normal. Judgment and thought content normal.   BP 110/68 mmHg  Pulse 64  Wt 193 lb (87.544 kg)  LMP 09/12/2015      Assessment & Plan:  Asthma, mild intermittent, uncomplicated - Plan: Spirometry with graph, albuterol (PROVENTIL HFA;VENTOLIN HFA) 108 (90 Base) MCG/ACT inhaler  Iron deficiency anemia due to chronic blood loss - Plan: CANCELED: CBC with Differential/Platelet  Hypokalemia - Plan: Comprehensive metabolic panel  Encounter to establish care  Gastroesophageal reflux disease, esophagitis presence not specified  Discussed that her spirometry test today was normal, no evidence of lung disease. Recommend that she keep an eye on triggers for asthma and keep a log of this. Prescription sent  for albuterol inhaler with instructions to let me know if needing this more than 2 times per month during the day and more than 1 time per month at night. Discussd that she may need a maintenance inhaler if she is requiring rescue inhaler or often than this. Discussed that I would like to get a copy of her recent labs and office visits from [facility]. Reports having her CBC checked there. She has a history of anemia due to heavy bleeding and fibroids and is currently taking  oral iron. She is seeing Dr. Daphene Calamity for this and plans to start hormone therapy for vaginal bleeding. Recommend that she continue taking Prilosec daily 30 minutes before breakfast and let me know if this is not controlling her reflux and we can potentially add a second dose. Also discussed GERD management such as eating small portions and not large heavy meals, avoiding foods that trigger her reflux and not eating at least 3 hours before laying down. She was recently evaluated for chest pain and was determined that GERD was likely etiology and not cardiac. No new chest pain symptoms since then.  Discussed that there is no obvious explanation for the knot-like sensation she is occasionally experiencing to her left upper abdomen. She will keep an eye on this and let me know if she notices any other symptoms. Do not suspect that this is anything serious.  Printed out potassium rich foods and will follow up on this pending labs.  Spent a minimum of 45 minutes with patient and at least 50% was in counseling and coordination of care.

## 2015-10-16 LAB — COMPREHENSIVE METABOLIC PANEL
ALT: 13 U/L (ref 6–29)
AST: 15 U/L (ref 10–35)
Albumin: 4.1 g/dL (ref 3.6–5.1)
Alkaline Phosphatase: 42 U/L (ref 33–115)
BUN: 16 mg/dL (ref 7–25)
CHLORIDE: 102 mmol/L (ref 98–110)
CO2: 24 mmol/L (ref 20–31)
Calcium: 9.4 mg/dL (ref 8.6–10.2)
Creat: 0.67 mg/dL (ref 0.50–1.10)
Glucose, Bld: 81 mg/dL (ref 65–99)
Potassium: 5.1 mmol/L (ref 3.5–5.3)
Sodium: 140 mmol/L (ref 135–146)
Total Bilirubin: 0.3 mg/dL (ref 0.2–1.2)
Total Protein: 7 g/dL (ref 6.1–8.1)

## 2016-05-29 ENCOUNTER — Encounter: Payer: Self-pay | Admitting: Family Medicine

## 2016-07-06 ENCOUNTER — Ambulatory Visit: Payer: BLUE CROSS/BLUE SHIELD | Admitting: Family Medicine

## 2016-07-13 ENCOUNTER — Encounter: Payer: Self-pay | Admitting: Family Medicine

## 2016-07-13 ENCOUNTER — Ambulatory Visit (INDEPENDENT_AMBULATORY_CARE_PROVIDER_SITE_OTHER): Payer: BLUE CROSS/BLUE SHIELD | Admitting: Family Medicine

## 2016-07-13 VITALS — BP 140/90 | HR 84 | Ht 67.0 in | Wt 206.2 lb

## 2016-07-13 DIAGNOSIS — R21 Rash and other nonspecific skin eruption: Secondary | ICD-10-CM | POA: Diagnosis not present

## 2016-07-13 DIAGNOSIS — R030 Elevated blood-pressure reading, without diagnosis of hypertension: Secondary | ICD-10-CM | POA: Diagnosis not present

## 2016-07-13 NOTE — Patient Instructions (Signed)
Follow-up with your dermatologist 

## 2016-07-13 NOTE — Progress Notes (Signed)
   Subjective:    Patient ID: Shari Gibson, female    DOB: 08-Mar-1966, 50 y.o.   MRN: [id number]  HPI Chief Complaint  Patient presents with  . Dark Spot on Leg    onset 2 months ago. left leg- itches larger than silver dollar. /RLB   She is here with complaints of a 2 month history of a dark, dry and itchy patch of skin to her left anterior lower leg. States the area has been unchanged over the course of the 2 month period. She has not been using anything.  States she is planning on seen a dermatologist for this but has not yet. She does have a dermatologist.  Denies fever, chills, night sweats, fatigue, unexplained weight loss, nausea, vomiting, diarrhea. Denies numbness, tingling, weakness.   Discussed that her blood pressure is higher than recommended. Denies history of hypertension. She does not smoke. She does drink alcohol on a regular basis.  Past Medical History:  Diagnosis Date  . Anemia   . Asthma   . Fibroids    Reviewed allergies, medications, past medical, and social history.   Review of Systems Pertinent positives and negatives in the history of present illness.     Objective:   Physical Exam  Constitutional: She appears well-developed and well-nourished. No distress.  Skin:     4 cm x 5.5 cm dry, rough and darkened area of skin to the anterior aspect of her left lower leg. No erythema, edema, induration or drainage. No signs of infection.    BP 140/90   Pulse 84   Ht 5\' 7"  (1.702 m)   Wt 206 lb 3.2 oz (93.5 kg)   LMP 07/08/2016   BMI 32.30 kg/m      Assessment & Plan:  Skin eruption  Elevated blood pressure reading  Discussed that she does not appear to have an infection to her LLE. Since the area is unchanged over the course of a 2 month period and the area is non specific looking, I recommend that she follow up with her dermatologist as she mentioned.  Discussed that her blood pressure is slightly elevated today and I recommend keep an eye  on this. Discussed diet, exercise and lifestyle modifications for blood pressure control. She will follow up as needed

## 2016-07-15 ENCOUNTER — Telehealth: Payer: Self-pay

## 2016-07-15 NOTE — Telephone Encounter (Signed)
Pt called the office to let you know that she saw her dermatologist yesterday. They took a biopsy and will let her know results in 1 week. Victorino December

## 2016-07-15 NOTE — Addendum Note (Signed)
Addended by: Arley Phenix L on: 07/15/2016 09:04 AM   Modules accepted: Orders

## 2016-08-07 ENCOUNTER — Encounter: Payer: Self-pay | Admitting: Family Medicine

## 2017-01-13 DIAGNOSIS — J45909 Unspecified asthma, uncomplicated: Secondary | ICD-10-CM | POA: Insufficient documentation

## 2017-01-13 NOTE — Progress Notes (Signed)
Subjective:    Patient ID: Shari Gibson, female    DOB: 05-09-1966, 51 y.o.   MRN: [id number]  HPI Chief Complaint  Patient presents with  . fasting cpe    fasting cpe. had some sips of grape juice at 6am this morning. will get pap done at obgyn. eye exam done back in february. having some Acid reflux after eating   She is here for a complete physical exam.  Complaints today include reflux with history of same. Is not taking anything for this. She reports working a 12 hour shift and takes Advil PM to sleep 3-4 days per week. She is also drinking alcohol, one to two drinks per day. States she often eats and lays down.   She also reports worrying a lot and feels anxious. States she calms herself down by scratching her skin. She has done this for years. States it is a habit. Has not been to a counselor but willing to go.   Other providers: Dr. Rogue Bussing- OB/GYN. Dr. Fabio Asa - dentist   Past medical history: asthma- well controlled and has not needed her inhaler for several months. , former smoker, anemia related to heavy menses- takes iron several days per week.  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  Does not Smoking quit 2 years ago, drinking alcohol almost every day, no drug use  Diet: not doing well Excerise: not exercising at all  Immunizations: Tdap over 10 years ago  Health maintenance:  Mammogram: due and will see obgyn next month and have this done Colonoscopy: never  Last Pap Smear: due -will see obgyn next month Last Menstrual cycle: Dec 16, 2016 normal cycles Last Dental Exam: twice a year Last Eye Exam: February 2018- done at work.   Wears seatbelt always, uses sunscreen, smoke detectors in home and functioning, does not text while driving and feels safe in home environment.   Reviewed allergies, medications, past medical, surgical, family, and social history.    Review of Systems Review of  Systems Constitutional: -fever, -chills, -sweats, -unexpected weight change,-fatigue ENT: -runny nose, -ear pain, -sore throat Cardiology:  -chest pain, -palpitations, -edema Respiratory: -cough, -shortness of breath, -wheezing Gastroenterology: -abdominal pain, -nausea, -vomiting, -diarrhea, -constipation  Hematology: -bleeding or bruising problems Musculoskeletal: -arthralgias, -myalgias, -joint swelling, -back pain Ophthalmology: -vision changes Urology: -dysuria, -difficulty urinating, -hematuria, -urinary frequency, -urgency Neurology: -headache, -weakness, -tingling, -numbness       Objective:   Physical Exam BP 122/78   Pulse 86   Ht 5' 6.5" (1.689 m)   Wt 218 lb (98.9 kg)   LMP 12/16/2016   BMI 34.66 kg/m   General Appearance:    Alert, cooperative, no distress, appears stated age  Head:    Normocephalic, without obvious abnormality, atraumatic  Eyes:    PERRL, conjunctiva/corneas clear, EOM's intact, fundi    benign  Ears:    Normal TM's and external ear canals  Nose:   Nares normal, mucosa normal, no drainage or sinus   tenderness  Throat:   Lips, mucosa, and tongue normal; teeth and gums normal  Neck:   Supple, no lymphadenopathy;  thyroid:  no   enlargement/tenderness/nodules; no carotid   bruit or JVD  Back:    Spine nontender, no curvature, ROM normal, no CVA     tenderness  Lungs:     Clear to auscultation bilaterally without wheezes, rales or     ronchi; respirations unlabored  Chest Wall:  No tenderness or deformity   Heart:    Regular rate and rhythm, S1 and S2 normal, no murmur, rub   or gallop  Breast Exam:    Has OB/GYN.  No axillary lymphadenopathy  Abdomen:     Soft, non-tender, nondistended, normoactive bowel sounds,    no masses, no hepatosplenomegaly  Genitalia:    Has OB/GYN  Rectal:    Referral to GI.   Extremities:   No clubbing, cyanosis or edema  Pulses:   2+ and symmetric all extremities  Skin:   Skin color, texture, turgor normal, no  rashes. Multiple areas of darkened pigmentation and excoriation. No sign of infection  Lymph nodes:   Cervical, supraclavicular, and axillary nodes normal  Neurologic:   CNII-XII intact, normal strength, sensation and gait; reflexes 2+ and symmetric throughout          Psych:   Normal mood, affect, hygiene and grooming.    Urinalysis dipstick: neg      Assessment & Plan:  Routine general medical examination at a health care facility - Plan: CBC with Differential/Platelet, Comprehensive metabolic panel, POCT urinalysis dipstick, TSH, Lipid panel  Mild intermittent asthma without complication  Screen for colon cancer - Plan: Ambulatory referral to Gastroenterology  Anxiety - Plan: TSH  Gastroesophageal reflux disease, esophagitis presence not specified  Need for Tdap vaccination - Plan: Tdap vaccine greater than or equal to 7yo IM  Counseled on GERD management.  Advised to stop daily alcohol use and Advil.  She will take a PPI and let me know if this is not helping.  Asthma appears well controlled.  Discussed healthy lifestyle modifications.  Anxiety- plan to have her see a counselor.  Tdap given.  GI referral for colonoscopy.  Discussed safety and health promotion.  Follow up pending labs.

## 2017-01-14 ENCOUNTER — Encounter: Payer: Self-pay | Admitting: Family Medicine

## 2017-01-14 ENCOUNTER — Ambulatory Visit (INDEPENDENT_AMBULATORY_CARE_PROVIDER_SITE_OTHER): Payer: BLUE CROSS/BLUE SHIELD | Admitting: Family Medicine

## 2017-01-14 VITALS — BP 122/78 | HR 86 | Ht 66.5 in | Wt 218.0 lb

## 2017-01-14 DIAGNOSIS — Z23 Encounter for immunization: Secondary | ICD-10-CM | POA: Diagnosis not present

## 2017-01-14 DIAGNOSIS — F419 Anxiety disorder, unspecified: Secondary | ICD-10-CM | POA: Diagnosis not present

## 2017-01-14 DIAGNOSIS — Z0000 Encounter for general adult medical examination without abnormal findings: Secondary | ICD-10-CM

## 2017-01-14 DIAGNOSIS — D649 Anemia, unspecified: Secondary | ICD-10-CM

## 2017-01-14 DIAGNOSIS — Z862 Personal history of diseases of the blood and blood-forming organs and certain disorders involving the immune mechanism: Secondary | ICD-10-CM | POA: Diagnosis not present

## 2017-01-14 DIAGNOSIS — J4520 Mild intermittent asthma, uncomplicated: Secondary | ICD-10-CM | POA: Diagnosis not present

## 2017-01-14 DIAGNOSIS — K219 Gastro-esophageal reflux disease without esophagitis: Secondary | ICD-10-CM | POA: Insufficient documentation

## 2017-01-14 DIAGNOSIS — Z1211 Encounter for screening for malignant neoplasm of colon: Secondary | ICD-10-CM | POA: Diagnosis not present

## 2017-01-14 DIAGNOSIS — F1010 Alcohol abuse, uncomplicated: Secondary | ICD-10-CM

## 2017-01-14 HISTORY — DX: Anemia, unspecified: D64.9

## 2017-01-14 LAB — CBC WITH DIFFERENTIAL/PLATELET
BASOS ABS: 97 {cells}/uL (ref 0–200)
BASOS PCT: 1 %
Eosinophils Absolute: 97 cells/uL (ref 15–500)
Eosinophils Relative: 1 %
HCT: 28.9 % — ABNORMAL LOW (ref 35.0–45.0)
Hemoglobin: 9 g/dL — ABNORMAL LOW (ref 11.7–15.5)
LYMPHS PCT: 29 %
Lymphs Abs: 2813 cells/uL (ref 850–3900)
MCH: 24 pg — AB (ref 27.0–33.0)
MCHC: 31.1 g/dL — AB (ref 32.0–36.0)
MCV: 77.1 fL — AB (ref 80.0–100.0)
MONOS PCT: 5 %
MPV: 10.3 fL (ref 7.5–12.5)
Monocytes Absolute: 485 cells/uL (ref 200–950)
NEUTROS PCT: 64 %
Neutro Abs: 6208 cells/uL (ref 1500–7800)
PLATELETS: 264 10*3/uL (ref 140–400)
RBC: 3.75 MIL/uL — ABNORMAL LOW (ref 3.80–5.10)
RDW: 16.3 % — AB (ref 11.0–15.0)
WBC: 9.7 10*3/uL (ref 4.0–10.5)

## 2017-01-14 LAB — LIPID PANEL
CHOLESTEROL: 166 mg/dL (ref <200)
HDL: 68 mg/dL (ref >50)
LDL Cholesterol: 80 mg/dL (ref <100)
TRIGLYCERIDES: 92 mg/dL (ref <150)
Total CHOL/HDL Ratio: 2.4 Ratio (ref <5.0)
VLDL: 18 mg/dL (ref <30)

## 2017-01-14 LAB — COMPREHENSIVE METABOLIC PANEL
ALT: 15 U/L (ref 6–29)
AST: 18 U/L (ref 10–35)
Albumin: 4.3 g/dL (ref 3.6–5.1)
Alkaline Phosphatase: 60 U/L (ref 33–130)
BUN: 13 mg/dL (ref 7–25)
CALCIUM: 9.3 mg/dL (ref 8.6–10.4)
CHLORIDE: 103 mmol/L (ref 98–110)
CO2: 21 mmol/L (ref 20–31)
Creat: 0.71 mg/dL (ref 0.50–1.05)
GLUCOSE: 91 mg/dL (ref 65–99)
POTASSIUM: 3.8 mmol/L (ref 3.5–5.3)
Sodium: 137 mmol/L (ref 135–146)
Total Bilirubin: 0.3 mg/dL (ref 0.2–1.2)
Total Protein: 7.3 g/dL (ref 6.1–8.1)

## 2017-01-14 LAB — TSH: TSH: 0.87 mIU/L

## 2017-01-14 LAB — POCT URINALYSIS DIPSTICK
Bilirubin, UA: NEGATIVE
Blood, UA: NEGATIVE
Glucose, UA: NEGATIVE
KETONES UA: NEGATIVE
LEUKOCYTES UA: NEGATIVE
NITRITE UA: NEGATIVE
PROTEIN UA: NEGATIVE
Spec Grav, UA: 1.02 (ref 1.010–1.025)
Urobilinogen, UA: NEGATIVE E.U./dL — AB
pH, UA: 6 (ref 5.0–8.0)

## 2017-01-14 MED ORDER — ALBUTEROL SULFATE HFA 108 (90 BASE) MCG/ACT IN AERS: 2.0000 | INHALATION_SPRAY | Freq: Four times a day (QID) | RESPIRATORY_TRACT | 2 refills | Status: DC | PRN

## 2017-01-14 NOTE — Patient Instructions (Signed)
You can call to schedule your appointment with the Counselor. A couple offices are listed below for you to call.    [facility]  [address], [place], [place] [address] Phone: [phone]   [facility]  [place], [place], [place] [address]  Phone: [phone]  [facility] [place], [place] [address]  Phone: [phone]     Heartburn Heartburn is a type of pain or discomfort that can happen in the throat or chest. It is often described as a burning pain. It may also cause a bad taste in the mouth. Heartburn may feel worse when you lie down or bend over, and it is often worse at night. Heartburn may be caused by stomach contents that move back up into the esophagus (reflux). Follow these instructions at home: Take these actions to decrease your discomfort and to help avoid complications. Diet  Follow a diet as recommended by your health care provider. This may involve avoiding foods and drinks such as: ? Coffee and tea (with or without caffeine). ? Drinks that contain alcohol. ? Energy drinks and sports drinks. ? Carbonated drinks or sodas. ? Chocolate and cocoa. ? Peppermint and mint flavorings. ? Garlic and onions. ? Horseradish. ? Spicy and acidic foods, including peppers, chili powder, curry powder, vinegar, hot sauces, and barbecue sauce. ? Citrus fruit juices and citrus fruits, such as oranges, lemons, and limes. ? Tomato-based foods, such as red sauce, chili, salsa, and pizza with red sauce. ? Fried and fatty foods, such as donuts, french fries, potato chips, and high-fat dressings. ? High-fat meats, such as hot dogs and fatty cuts of red and white meats, such as rib eye steak, sausage, ham, and bacon. ? High-fat dairy items, such as whole milk, butter, and cream cheese.  Eat small, frequent meals instead of large meals.  Avoid drinking large  amounts of liquid with your meals.  Avoid eating meals during the 2-3 hours before bedtime.  Avoid lying down right after you eat.  Do not exercise right after you eat. General instructions  Pay attention to any changes in your symptoms.  Take over-the-counter and prescription medicines only as told by your health care provider. Do not take aspirin, ibuprofen, or other NSAIDs unless your health care provider told you to do so.  Do not use any tobacco products, including cigarettes, chewing tobacco, and e-cigarettes. If you need help quitting, ask your health care provider.  Wear loose-fitting clothing. Do not wear anything tight around your waist that causes pressure on your abdomen.  Raise (elevate) the head of your bed about 6 inches (15 cm).  Try to reduce your stress, such as with yoga or meditation. If you need help reducing stress, ask your health care provider.  If you are overweight, reduce your weight to an amount that is healthy for you. Ask your health care provider for guidance about a safe weight loss goal.  Keep all follow-up visits as told by your health care provider. This is important. Contact a health care provider if:  You have new symptoms.  You have unexplained weight loss.  You have difficulty swallowing, or it hurts to swallow.  You have wheezing or a persistent cough.  Your symptoms do not improve with treatment.  You have frequent heartburn for more than two weeks. Get help right away if:  You have pain in your arms, neck, jaw, teeth, or  back.  You feel sweaty, dizzy, or light-headed.  You have chest pain or shortness of breath.  You vomit and your vomit looks like blood or coffee grounds.  Your stool is bloody or black. This information is not intended to replace advice given to you by your health care provider. Make sure you discuss any questions you have with your health care provider. Document Released: 12/20/2008 Document Revised:  01/09/2016 Document Reviewed: 11/28/2014 Elsevier Interactive Patient Education  2017 [employer].

## 2017-01-18 ENCOUNTER — Encounter: Payer: Self-pay | Admitting: Internal Medicine

## 2017-01-19 ENCOUNTER — Encounter: Payer: Self-pay | Admitting: Gastroenterology

## 2017-02-24 ENCOUNTER — Encounter: Payer: Self-pay | Admitting: Family Medicine

## 2017-03-03 ENCOUNTER — Encounter: Payer: Commercial Managed Care - PPO | Admitting: Gastroenterology

## 2017-04-07 ENCOUNTER — Telehealth: Payer: Self-pay | Admitting: Family Medicine

## 2017-04-07 ENCOUNTER — Emergency Department (HOSPITAL_BASED_OUTPATIENT_CLINIC_OR_DEPARTMENT_OTHER)
Admission: EM | Admit: 2017-04-07 | Discharge: 2017-04-07 | Disposition: A | Discharge status: Home / Self Care | Payer: BLUE CROSS/BLUE SHIELD | Attending: Emergency Medicine | Admitting: Emergency Medicine

## 2017-04-07 ENCOUNTER — Encounter (HOSPITAL_BASED_OUTPATIENT_CLINIC_OR_DEPARTMENT_OTHER): Payer: Self-pay

## 2017-04-07 ENCOUNTER — Emergency Department (HOSPITAL_BASED_OUTPATIENT_CLINIC_OR_DEPARTMENT_OTHER): Payer: BLUE CROSS/BLUE SHIELD

## 2017-04-07 DIAGNOSIS — E876 Hypokalemia: Secondary | ICD-10-CM | POA: Diagnosis not present

## 2017-04-07 DIAGNOSIS — R0789 Other chest pain: Secondary | ICD-10-CM | POA: Diagnosis not present

## 2017-04-07 DIAGNOSIS — K219 Gastro-esophageal reflux disease without esophagitis: Secondary | ICD-10-CM | POA: Diagnosis not present

## 2017-04-07 DIAGNOSIS — Z87891 Personal history of nicotine dependence: Secondary | ICD-10-CM | POA: Insufficient documentation

## 2017-04-07 DIAGNOSIS — R079 Chest pain, unspecified: Secondary | ICD-10-CM | POA: Diagnosis present

## 2017-04-07 DIAGNOSIS — J45909 Unspecified asthma, uncomplicated: Secondary | ICD-10-CM | POA: Insufficient documentation

## 2017-04-07 HISTORY — DX: Gastro-esophageal reflux disease without esophagitis: K21.9

## 2017-04-07 LAB — BASIC METABOLIC PANEL
ANION GAP: 12 (ref 5–15)
BUN: 10 mg/dL (ref 6–20)
CALCIUM: 8.9 mg/dL (ref 8.9–10.3)
CO2: 26 mmol/L (ref 22–32)
CREATININE: 0.64 mg/dL (ref 0.44–1.00)
Chloride: 98 mmol/L — ABNORMAL LOW (ref 101–111)
GFR calc non Af Amer: >60 mL/min (ref >60)
Glucose, Bld: 114 mg/dL — ABNORMAL HIGH (ref 65–99)
Potassium: 3.3 mmol/L — ABNORMAL LOW (ref 3.5–5.1)
SODIUM: 136 mmol/L (ref 135–145)

## 2017-04-07 LAB — MAGNESIUM: MAGNESIUM: 1.8 mg/dL (ref 1.7–2.4)

## 2017-04-07 LAB — CBC
HCT: 34.9 % — ABNORMAL LOW (ref 36.0–46.0)
HEMOGLOBIN: 11.5 g/dL — AB (ref 12.0–15.0)
MCH: 28.3 pg (ref 26.0–34.0)
MCHC: 33 g/dL (ref 30.0–36.0)
MCV: 86 fL (ref 78.0–100.0)
PLATELETS: 395 10*3/uL (ref 150–400)
RBC: 4.06 MIL/uL (ref 3.87–5.11)
RDW: 13.9 % (ref 11.5–15.5)
WBC: 8.2 10*3/uL (ref 4.0–10.5)

## 2017-04-07 LAB — TROPONIN I

## 2017-04-07 LAB — D-DIMER, QUANTITATIVE (NOT AT ARMC)

## 2017-04-07 MED ORDER — PANTOPRAZOLE SODIUM 20 MG PO TBEC: 20.0000 mg | DELAYED_RELEASE_TABLET | Freq: Two times a day (BID) | ORAL | 0 refills | Status: DC

## 2017-04-07 MED ORDER — POTASSIUM CHLORIDE CRYS ER 20 MEQ PO TBCR
40.0000 meq | EXTENDED_RELEASE_TABLET | Freq: Once | ORAL | Status: AC
Start: 2017-04-07 — End: 2017-04-07
  Administered 2017-04-07: 40 meq via ORAL
  Filled 2017-04-07: qty 2

## 2017-04-07 NOTE — Telephone Encounter (Signed)
Pt called stating she was having some chest discomfort and some SOB and had been having some numbness in her left and right arm side, called back and asked sabrina and she informed me to tell the pt to got to the ER, pt understood and stated she would go the ER

## 2017-04-07 NOTE — ED Provider Notes (Signed)
[place] DEPT [facility] Provider Note   CSN: [id number] Arrival date & time: 04/07/17  1720     History   Chief Complaint Chief Complaint  Patient presents with  . Chest Pain    HPI Shari Gibson is a 51 y.o. female.  HPI Patient's had several weeks of central chest tightness. She's also had intermittent numbness to bilateral arms. She complains of some shortness of breath especially with exertion. No cough, fever or chills. Patient states she drove to [place] in July. Denies lower extremity pain. No previous history of blood clots. States that she believes her symptoms are worse when she lays down. Past Medical History:  Diagnosis Date  . Anemia   . Asthma   . Fibroids   . GERD (gastroesophageal reflux disease)     Patient Active Problem List   Diagnosis Date Noted  . Gastroesophageal reflux disease 01/14/2017  . Anxiety 01/14/2017  . Alcohol abuse, daily use 01/14/2017  . History of anemia 01/14/2017  . Asthma 01/13/2017    Past Surgical History:  Procedure Laterality Date  . TUBAL LIGATION      OB History    No data available       Home Medications    Prior to Admission medications   Medication Sig Start Date End Date Taking? Authorizing Provider  pantoprazole (PROTONIX) 20 MG tablet Take 1 tablet (20 mg total) by mouth 2 (two) times daily. 04/07/17   Julianne Rice, MD    Family History Family History  Problem Relation Age of Onset  . Adopted: Yes  . Diabetes Maternal Aunt   . Hypertension Maternal Aunt   . Diabetes Mother   . Hypertension Mother   . Alzheimer's disease Maternal Grandmother     Social History Social History  Substance Use Topics  . Smoking status: Former Smoker    Years: 20.00    Types: Cigarettes    Quit date: 03/18/2015  . Smokeless tobacco: Never Used  . Alcohol use 0.0 oz/week     Comment: weekly     Allergies   Patient has no known allergies.   Review of Systems Review of Systems  Constitutional:  Negative for chills and fever.  HENT: Negative for congestion, facial swelling, sinus pain, sinus pressure, sore throat and trouble swallowing.   Respiratory: Positive for chest tightness and shortness of breath. Negative for cough.   Cardiovascular: Negative for chest pain, palpitations and leg swelling.  Gastrointestinal: Negative for abdominal pain, diarrhea, nausea and vomiting.  Genitourinary: Negative for dysuria and flank pain.  Musculoskeletal: Negative for back pain, joint swelling, myalgias, neck pain and neck stiffness.  Skin: Negative for rash and wound.  Neurological: Positive for numbness. Negative for dizziness, weakness and headaches.  All other systems reviewed and are negative.    Physical Exam Updated Vital Signs BP (!) 150/86 (BP Location: Right Arm)   Pulse 74   Temp 98 F (36.7 C) (Oral)   Resp 20   Ht 5\' 7"  (1.702 m)   Wt 99.5 kg (219 lb 5.7 oz)   LMP  (LMP Unknown)   SpO2 100%   BMI 34.36 kg/m   Physical Exam  Constitutional: She is oriented to person, place, and time. She appears well-developed and well-nourished. No distress.  HENT:  Head: Normocephalic and atraumatic.  Mouth/Throat: Oropharynx is clear and moist. No oropharyngeal exudate.  Eyes: Pupils are equal, round, and reactive to light. EOM are normal.  Neck: Normal range of motion. Neck supple.  Cardiovascular: Normal rate  and regular rhythm.  Exam reveals no gallop and no friction rub.   No murmur heard. Pulmonary/Chest: Effort normal and breath sounds normal. No respiratory distress. She has no wheezes. She has no rales. She exhibits no tenderness.  Abdominal: Soft. Bowel sounds are normal. There is no tenderness. There is no rebound and no guarding.  Musculoskeletal: Normal range of motion. She exhibits no edema or tenderness.  Question mild left calf swelling compared to right. No tenderness. Distal pulses are 2+. No midline thoracic or lumbar tenderness. No CVA tenderness.  Neurological:  She is alert and oriented to person, place, and time.  Patient is alert and oriented x3 with clear, goal oriented speech. Patient has 5/5 motor in all extremities. Sensation is intact to light touch. Bilateral finger-to-nose is normal with no signs of dysmetria. Patient has a normal gait and walks without assistance.  Skin: Skin is warm and dry. Capillary refill takes less than 2 seconds. No rash noted. No erythema.  Psychiatric: She has a normal mood and affect. Her behavior is normal.  Nursing note and vitals reviewed.    ED Treatments / Results  Labs (all labs ordered are listed, but only abnormal results are displayed) Labs Reviewed  BASIC METABOLIC PANEL - Abnormal; Notable for the following:       Result Value   Potassium 3.3 (*)    Chloride 98 (*)    Glucose, Bld 114 (*)    All other components within normal limits  CBC - Abnormal; Notable for the following:    Hemoglobin 11.5 (*)    HCT 34.9 (*)    All other components within normal limits  TROPONIN I  D-DIMER, QUANTITATIVE (NOT AT [facility])  MAGNESIUM    EKG  EKG Interpretation  Date/Time:  Wednesday April 07 2017 17:28:26 EDT Ventricular Rate:  113 PR Interval:  162 QRS Duration: 60 QT Interval:  326 QTC Calculation: 447 R Axis:   21 Text Interpretation:  Sinus tachycardia Possible Left atrial enlargement Low voltage QRS Cannot rule out Anterior infarct , age undetermined Abnormal ECG Confirmed by Lita Mains  MD, Janai Brannigan ([id number]) on 04/07/2017 6:49:41 PM       Radiology Dg Chest 2 View  Result Date: 04/07/2017 CLINICAL DATA:  Chest pain and bilateral arm numbness x 2 weeks, h/o asthma. EXAM: CHEST  2 VIEW COMPARISON:  None. FINDINGS: Normal mediastinum and cardiac silhouette. Normal pulmonary vasculature. No evidence of effusion, infiltrate, or pneumothorax. No acute bony abnormality. IMPRESSION: No acute cardiopulmonary process. Electronically Signed   By: Suzy Bouchard M.D.   On: 04/07/2017 17:40     Procedures Procedures (including critical care time)  Medications Ordered in ED Medications  potassium chloride SA (K-DUR,KLOR-CON) CR tablet 40 mEq (40 mEq Oral Given 04/07/17 2039)     Initial Impression / Assessment and Plan / ED Course  I have reviewed the triage vital signs and the nursing notes.  Pertinent labs & imaging results that were available during my care of the patient were reviewed by me and considered in my medical decision making (see chart for details).     Cardiac workup including EKG and troponin without worrisome findings. Patient has a normal d-dimer. Patient states she does notice her symptoms worse when she is lying flat. Has a history of esophageal reflux disease states that she typically eats shortly before she goes to bed. She also consumes spicy and acidic foods. She's been intermittently using ibuprofen. Patient says she does take a PPI but has never seen  a gastroenterologist. She's been given lifestyle change advice and will start on PPI twice daily. We'll give referral to gastroenterology. Given patient does have risk factors for coronary artery disease we'll give referral to cardiology. Suspect that her symptoms are likely related to gastric esophageal reflux and aspiration with mild pneumonitis that is triggering her asthma. She is currently symptom-free. Advised to return immediately for any worsening of her symptoms or concerns.  Final Clinical Impressions(s) / ED Diagnoses   Final diagnoses:  Atypical chest pain  Hypokalemia  Gastroesophageal reflux disease without esophagitis    New Prescriptions New Prescriptions   PANTOPRAZOLE (PROTONIX) 20 MG TABLET    Take 1 tablet (20 mg total) by mouth 2 (two) times daily.     Julianne Rice, MD 04/07/17 [phone]

## 2017-04-07 NOTE — ED Triage Notes (Signed)
C/o CP , SOB x 2 weeks-NAD-steady gait

## 2017-06-28 ENCOUNTER — Telehealth: Payer: Self-pay | Admitting: Family Medicine

## 2017-06-28 MED ORDER — ALBUTEROL SULFATE HFA 108 (90 BASE) MCG/ACT IN AERS: 2.0000 | INHALATION_SPRAY | Freq: Four times a day (QID) | RESPIRATORY_TRACT | 0 refills | Status: DC | PRN

## 2017-06-28 NOTE — Telephone Encounter (Signed)
Please ask her how often she is needing the inhaler. Ok to refill but if she is needing it more than usual then she will need to be seen.

## 2017-06-28 NOTE — Telephone Encounter (Signed)
Left message for pt to call me back 

## 2017-06-28 NOTE — Telephone Encounter (Signed)
Patient called for Albuterol inhaler to [facility] on [address].  She lost her last one.

## 2017-06-28 NOTE — Telephone Encounter (Signed)
Pt states she is just getting over a cold so she had to use it more frequently. She was advised that is she has to use it daily or more often then needs an ov to make sure she is ok.  Refilled med

## 2017-07-15 ENCOUNTER — Encounter: Payer: Self-pay | Admitting: Family Medicine

## 2017-07-15 ENCOUNTER — Ambulatory Visit: Payer: BLUE CROSS/BLUE SHIELD | Admitting: Family Medicine

## 2017-07-15 VITALS — BP 128/82 | HR 104 | Temp 98.3°F | Resp 16 | Wt 227.9 lb

## 2017-07-15 DIAGNOSIS — R05 Cough: Secondary | ICD-10-CM

## 2017-07-15 DIAGNOSIS — R059 Cough, unspecified: Secondary | ICD-10-CM

## 2017-07-15 DIAGNOSIS — J209 Acute bronchitis, unspecified: Secondary | ICD-10-CM | POA: Diagnosis not present

## 2017-07-15 DIAGNOSIS — K219 Gastro-esophageal reflux disease without esophagitis: Secondary | ICD-10-CM | POA: Diagnosis not present

## 2017-07-15 DIAGNOSIS — F1010 Alcohol abuse, uncomplicated: Secondary | ICD-10-CM

## 2017-07-15 DIAGNOSIS — J45909 Unspecified asthma, uncomplicated: Secondary | ICD-10-CM

## 2017-07-15 MED ORDER — MOMETASONE FUROATE 220 MCG/INH IN AEPB: 2.0000 | INHALATION_SPRAY | Freq: Every day | RESPIRATORY_TRACT | 0 refills | Status: DC

## 2017-07-15 MED ORDER — AZITHROMYCIN 250 MG PO TABS: ORAL_TABLET | ORAL | 0 refills | Status: DC

## 2017-07-15 NOTE — Patient Instructions (Addendum)
Take the antibiotic as prescribed. Use the steroid inhaler 2 puffs per day and only use the albuterol as prescribed. Rinse your mouth out after using the Asmanex.   Take the acid reducer daily. Reflux may be making your cough worse as well.   Return in 2 weeks.   [facility] [address] [place] [address] [place]  [phone]  Go to an [place].

## 2017-07-15 NOTE — Progress Notes (Signed)
Chief Complaint  Patient presents with  . URI    sob,?asthma?winded, had for 3 weeks and now worse  . Cough  . Sore Throat    Subjective:  Shari Gibson is a 51 y.o. female who presents for a 3 week history of sneezing, rhinorrhea, nasal congestion, and cough but symptoms improved and she felt back to normal for about a week and then 3 days ago she started coughing and having chest congestion and wheezing.   Underlying asthma that has been well controlled up until 2 week ago. She has been using her albuterol inhaler frequently.     Her BP is elevated and she does not have a history of HTN. Does not check her BP at home.   Denies fever, chills, dizziness, palpitations, abdominal pain, N/V/D, LE edema.   Treatment to date: Alka Seltzer cold medication .  Denies sick contacts.  No other aggravating or relieving factors.   Reports daily alcohol use and dependency for the past 2 years or so. States she tried to stop drinking one time but could only go 2 days without drinking and she felt "bad" so she started back. Has not been involved in rehab before. Is interested in stopping.   Reviewed allergies, medications, past medical, surgical, family, and social history.  ROS as in subjective.   Objective: Vitals:   07/15/17 0809  BP: 128/82  Pulse: (!) 104  Resp: 16  Temp: 98.3 F (36.8 C)  SpO2: 99%    General appearance: Alert, WD/WN, no distress, mildly ill appearing                             Skin: warm, no rash                           Head: no sinus tenderness                            Eyes: conjunctiva normal, corneas clear, PERRLA                            Ears: pearly TMs, external ear canals normal                          Nose: septum midline, turbinates swollen, with erythema and clear discharge             Mouth/throat: MMM, tongue normal, mild pharyngeal erythema                           Neck: supple, no adenopathy, no thyromegaly, nontender          Heart: RRR, normal S1, S2, no murmurs                         Lungs:  faint expiratory wheezes, no rales, or rhonchi.  Normal work of breathing.  Speaking in full sentences.      Assessment: Acute bronchitis with asthma - Plan: azithromycin (ZITHROMAX Z-PAK) 250 MG tablet, mometasone (ASMANEX 60 METERED DOSES) 220 MCG/INH inhaler  Gastroesophageal reflux disease, esophagitis presence not specified  Alcohol abuse, daily use  Cough    Plan: Discussed diagnosis and treatment of acute bronchitis with underlying asthma.  Z-Pak sent to  her pharmacy.  Asmanex Twisthaler sample given with instructions for use.  Advised her to not use albuterol inhaler more often than prescribed.  She is not in any distress.  Suggested symptomatic OTC remedies.  She is aware that her blood pressure is elevated today without a history of hypertension.  We will recheck this in 2 weeks.  Likely due to excessive albuterol and cold medication use. Nasal saline spray for congestion.  Tylenol or Ibuprofen OTC for fever and malaise.  Recommend that she take Protonix daily for reflux.  This may be contributing to her cough and congestion. Discussed that she has alcohol dependency and she is aware of this.  Recommend she try going to an [place] meeting and also will give her information for [facility] recovery and [employer].  Offered labs and she declines.  We will check blood work when she returns in 2 weeks.

## 2017-09-15 ENCOUNTER — Ambulatory Visit: Payer: BLUE CROSS/BLUE SHIELD | Admitting: Family Medicine

## 2017-09-17 ENCOUNTER — Ambulatory Visit: Payer: BLUE CROSS/BLUE SHIELD | Admitting: Family Medicine

## 2017-09-20 ENCOUNTER — Other Ambulatory Visit: Payer: Self-pay | Admitting: Family Medicine

## 2017-09-20 ENCOUNTER — Encounter: Payer: Self-pay | Admitting: Family Medicine

## 2017-09-20 ENCOUNTER — Ambulatory Visit
Admission: RE | Admit: 2017-09-20 | Discharge: 2017-09-20 | Disposition: A | Discharge status: Home / Self Care | Payer: BLUE CROSS/BLUE SHIELD | Source: Ambulatory Visit | Attending: Family Medicine | Admitting: Family Medicine

## 2017-09-20 ENCOUNTER — Ambulatory Visit: Payer: BLUE CROSS/BLUE SHIELD | Admitting: Family Medicine

## 2017-09-20 VITALS — BP 110/80 | HR 106 | Temp 98.3°F | Resp 18 | Wt 219.6 lb

## 2017-09-20 DIAGNOSIS — Z87891 Personal history of nicotine dependence: Secondary | ICD-10-CM

## 2017-09-20 DIAGNOSIS — R942 Abnormal results of pulmonary function studies: Secondary | ICD-10-CM

## 2017-09-20 DIAGNOSIS — R0602 Shortness of breath: Secondary | ICD-10-CM

## 2017-09-20 DIAGNOSIS — J4530 Mild persistent asthma, uncomplicated: Secondary | ICD-10-CM

## 2017-09-20 DIAGNOSIS — R062 Wheezing: Secondary | ICD-10-CM

## 2017-09-20 DIAGNOSIS — F1020 Alcohol dependence, uncomplicated: Secondary | ICD-10-CM | POA: Diagnosis not present

## 2017-09-20 MED ORDER — MOMETASONE FUROATE 220 MCG/INH IN AEPB: 2.0000 | INHALATION_SPRAY | Freq: Every day | RESPIRATORY_TRACT | 2 refills | Status: DC

## 2017-09-20 MED ORDER — ALBUTEROL SULFATE HFA 108 (90 BASE) MCG/ACT IN AERS: 2.0000 | INHALATION_SPRAY | Freq: Four times a day (QID) | RESPIRATORY_TRACT | 1 refills | Status: DC | PRN

## 2017-09-20 NOTE — Patient Instructions (Signed)
Start back on the Asmanex inhaler. Make sure your are rinsing your mouth out after each use.   Use the albuterol as needed.   You will receive a phone call from the pulmonologist office.   We will call you with the XR and lab results.

## 2017-09-20 NOTE — Progress Notes (Signed)
   Subjective:    Patient ID: Shari Gibson, female    DOB: Nov 01, 1965, 52 y.o.   MRN: [id number]  HPI Chief Complaint  Patient presents with  . follow-up    follow-up on asthmas, still having some wheezing   She is here to follow up on asthma. Diagnosed as a child but then did not have issues for years. States her asthma symptoms seem to be returning, especially over the past 3-4 months. She is having wheezing upon awakening in the morning and mild shortness of breath with wheezing with activity  She was diagnosed with bronchitis and treated with an antibiotic and states her symptoms improved but continues having persistent daily wheezing along with occasional shortness of breath.   States she used a steroid inhaler since November and this improved her symptoms and she did not need her albuterol inhaler as often. Since running out of the steroid inhaler. States she was using her albuterol inhaler 4-5 times per week when she did not have the steroid on board.  She is currently out of Asmanex and albuterol.   She is a former smoker. Stopped in 2016. Smoked approximately 20 years.   Reports daily alcohol use. States she has cut back from 3 glasses of wine to 1 glass per day. Did not go to [place] meeting or contact rehab facility as I recommended. She is not ready to completely stop drinking but it concerned about her declining health.   States she has been taking a dietary supplement and thinks this is making her heart race at times after taking it. She has been evaluated for chest pain in the past and negative cardiac work up. No new symptoms.   Denies fever, chills, dizziness, chest pain, abdominal pain, N/V/D, urinary symptoms, LE edema.   Reviewed allergies, medications, past medical, surgical, family, and social history.    Review of Systems Pertinent positives and negatives in the history of present illness.     Objective:   Physical Exam BP 110/80   Pulse (!) 106   Temp  98.3 F (36.8 C) (Oral)   Resp 18   Wt 219 lb 9.6 oz (99.6 kg)   SpO2 98%   BMI 34.39 kg/m   Alert and in no distress. Nares patent, no drainage. Tympanic membranes and canals are normal. Pharyngeal area is normal. Neck is supple without adenopathy or thyromegaly. Cardiac exam shows a regular sinus rhythm without murmurs or gallops. Lungs are clear to auscultation., normal work of breathing.  Extremities without edema.        Assessment & Plan:  Mild persistent asthma, unspecified whether complicated - Plan: CBC with Differential/Platelet, Comprehensive metabolic panel, DG Chest 2 View, albuterol (PROVENTIL HFA;VENTOLIN HFA) 108 (90 Base) MCG/ACT inhaler, mometasone (ASMANEX 60 METERED DOSES) 220 MCG/INH inhaler, Spirometry with graph  Alcohol dependence, daily use ([place]) - Plan: Comprehensive metabolic panel  Former smoker - Plan: DG Chest 2 View  Wheezing - Plan: CBC with Differential/Platelet, Comprehensive metabolic panel, DG Chest 2 View, Spirometry with graph  Shortness of breath - Plan: CBC with Differential/Platelet, Comprehensive metabolic panel, DG Chest 2 View, Spirometry with graph  Abnormal PFT  She is well-appearing today.  Abnormal PFT. Refilled Asmanex and albuterol. Plan to send her for chest x-ray and refer to pulmonology. Congratulated her on cutting back on daily alcohol use however we did again discuss potential health consequences from alcohol.   Follow up pending chest XR and as needed.

## 2017-09-21 LAB — CBC WITH DIFFERENTIAL/PLATELET
BASOS ABS: 0.1 10*3/uL (ref 0.0–0.2)
BASOS: 1 %
EOS (ABSOLUTE): 0.2 10*3/uL (ref 0.0–0.4)
Eos: 3 %
Hematocrit: 28.9 % — ABNORMAL LOW (ref 34.0–46.6)
Hemoglobin: 9.1 g/dL — ABNORMAL LOW (ref 11.1–15.9)
IMMATURE GRANS (ABS): 0 10*3/uL (ref 0.0–0.1)
Immature Granulocytes: 0 %
LYMPHS: 37 %
Lymphocytes Absolute: 2.8 10*3/uL (ref 0.7–3.1)
MCH: 22.3 pg — ABNORMAL LOW (ref 26.6–33.0)
MCHC: 31.5 g/dL (ref 31.5–35.7)
MCV: 71 fL — ABNORMAL LOW (ref 79–97)
MONOCYTES: 6 %
Monocytes Absolute: 0.4 10*3/uL (ref 0.1–0.9)
NEUTROS PCT: 53 %
Neutrophils Absolute: 4.1 10*3/uL (ref 1.4–7.0)
PLATELETS: 333 10*3/uL (ref 150–379)
RBC: 4.08 x10E6/uL (ref 3.77–5.28)
RDW: 17 % — ABNORMAL HIGH (ref 12.3–15.4)
WBC: 7.6 10*3/uL (ref 3.4–10.8)

## 2017-09-21 LAB — COMPREHENSIVE METABOLIC PANEL
ALT: 26 IU/L (ref 0–32)
AST: 30 IU/L (ref 0–40)
Albumin/Globulin Ratio: 1.6 (ref 1.2–2.2)
Albumin: 4.5 g/dL (ref 3.5–5.5)
Alkaline Phosphatase: 53 IU/L (ref 39–117)
BUN/Creatinine Ratio: 11 (ref 9–23)
BUN: 9 mg/dL (ref 6–24)
Bilirubin Total: <0.2 mg/dL (ref 0.0–1.2)
CO2: 26 mmol/L (ref 20–29)
CREATININE: 0.83 mg/dL (ref 0.57–1.00)
Calcium: 9.5 mg/dL (ref 8.7–10.2)
Chloride: 104 mmol/L (ref 96–106)
GFR calc non Af Amer: 82 mL/min/{1.73_m2} (ref >59)
GFR, EST AFRICAN AMERICAN: 95 mL/min/{1.73_m2} (ref >59)
GLUCOSE: 96 mg/dL (ref 65–99)
Globulin, Total: 2.8 g/dL (ref 1.5–4.5)
Potassium: 4.1 mmol/L (ref 3.5–5.2)
Sodium: 142 mmol/L (ref 134–144)
TOTAL PROTEIN: 7.3 g/dL (ref 6.0–8.5)

## 2017-09-28 ENCOUNTER — Encounter: Payer: Self-pay | Admitting: Internal Medicine

## 2017-09-29 ENCOUNTER — Telehealth: Payer: Self-pay | Admitting: Family Medicine

## 2017-09-29 NOTE — Telephone Encounter (Signed)
Colonoscopy requested from [facility]  per pt's instructions. Received a fax today stated no record on file for that pt.

## 2017-09-29 NOTE — Telephone Encounter (Signed)
Pt has not had her colonoscopy yet. Looks like she no showed her appointment last year

## 2017-09-29 NOTE — Telephone Encounter (Signed)
Please check on this with the patient.

## 2017-09-30 ENCOUNTER — Ambulatory Visit: Payer: BLUE CROSS/BLUE SHIELD | Admitting: Pulmonary Disease

## 2017-09-30 ENCOUNTER — Encounter: Payer: Self-pay | Admitting: Pulmonary Disease

## 2017-09-30 DIAGNOSIS — G4726 Circadian rhythm sleep disorder, shift work type: Secondary | ICD-10-CM

## 2017-09-30 DIAGNOSIS — J4530 Mild persistent asthma, uncomplicated: Secondary | ICD-10-CM | POA: Diagnosis not present

## 2017-09-30 MED ORDER — PANTOPRAZOLE SODIUM 40 MG PO TBEC: 40.0000 mg | DELAYED_RELEASE_TABLET | Freq: Every day | ORAL | 1 refills | Status: DC

## 2017-09-30 NOTE — Assessment & Plan Note (Signed)
Try to avoid alcohol to get to sleep. Okay to use melatonin 3-5 mg 1 hour before bedtime  Reinforced good sleep hygiene habits

## 2017-09-30 NOTE — Assessment & Plan Note (Signed)
shortness of breath may be related to weight gain or to asthma (not convincing ) or to reflux as a trigger.  Stay on Asmanex daily, take albuterol as needed only. Keep peak flow record twice daily and bring it in on your next visit.

## 2017-09-30 NOTE — Progress Notes (Signed)
Subjective:    Patient ID: Shari Gibson, female    DOB: [birth year], 52 y.o.   MRN: [id number]  HPI  52 year old smoker presents for evaluation of dyspnea on exertion ongoing for about a year. She had an episode of bronchitis and feels that dyspnea has been worse For the past 3 months. She reports dyspnea not only on exertion and activity but also when at rest and simply lying down.  She denies significant stressors.  She has been using albuterol for about a year with some help and started using Asmanex since November and this has helped her cut down on the use of albuterol.  She also thinks that her weight gain may be contributing and she has gained about 30 pounds over the past year. She reports an occasional dry cough and intermittent wheezing. She had childhood asthma as a teenager but then grew out of this. She smokes about half pack per day, started on her 62s and has quit more than 4 times sometimes for almost 2 years, total less than 15 pack years  She reports significant acid reflux on a daily basis and takes over-the-counter Prilosec. She reports occasional allergies during the spring but does not any medications for this.  She denies nasal congestion or postnasal drip.  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  She comes home and has resorted to drinking 1-2 glasses of wine to help her get to sleep.  She also takes over-the-counter sleep medication.  On her days off she has gained sleeps during the day and stays up during the night  Spirometry 09/2017 shows moderate restriction with ratio of 84, FEV1 of 67% with FVC of 65%, chest x-ray 09/20/17 did not show any infiltrates or effusions   Past Medical History:  Diagnosis Date  . Anemia   . Asthma   . Fibroids   . GERD (gastroesophageal reflux disease)    Past Surgical History:  Procedure Laterality Date  . TUBAL LIGATION      No Known  Allergies   Social History   Socioeconomic History  . Marital status: Single    Spouse name: Not on file  . Number of children: Not on file  . Years of education: Not on file  . Highest education level: Not on file  Social Needs  . Financial resource strain: Not on file  . Food insecurity - worry: Not on file  . Food insecurity - inability: Not on file  . Transportation needs - medical: Not on file  . Transportation needs - non-medical: Not on file  Occupational History  . Not on file  Tobacco Use  . Smoking status: Former Smoker    Years: 20.00    Types: Cigarettes    Last attempt to quit: 03/18/2015    Years since quitting: 2.5  . Smokeless tobacco: Never Used  Substance and Sexual Activity  . Alcohol use: Yes    Alcohol/week: 0.0 oz    Comment: daily wine  . Drug use: No  . Sexual activity: Not on file  Other Topics Concern  . Not on file  Social History Narrative  . Not on file     Family History  Adopted: Yes  Problem Relation Age of Onset  . Diabetes Maternal Aunt   . Hypertension Maternal Aunt   . Diabetes Mother   . Hypertension Mother   . Alzheimer's disease Maternal Grandmother  Review of Systems Constitutional: negative for anorexia, fevers and sweats  Eyes: negative for irritation, redness and visual disturbance  Ears, nose, mouth, throat, and face: negative for earaches, epistaxis, nasal congestion and sore throat  Respiratory: negative for cough,  sputum and wheezing  Cardiovascular: negative for chest pain, lower extremity edema, orthopnea, palpitations and syncope  Gastrointestinal: negative for abdominal pain, constipation, diarrhea, melena, nausea and vomiting  Genitourinary:negative for dysuria, frequency and hematuria  Hematologic/lymphatic: negative for bleeding, easy bruising and lymphadenopathy  Musculoskeletal:negative for arthralgias, muscle weakness and stiff joints  Neurological: negative for coordination problems, gait  problems, headaches and weakness  Endocrine: negative for diabetic symptoms including polydipsia, polyuria and weight loss     Objective:   Physical Exam  Gen. Pleasant, obese, in no distress, normal affect ENT - no lesions, no post nasal drip, class 2-3 airway Neck: No JVD, no thyromegaly, no carotid bruits Lungs: no use of accessory muscles, no dullness to percussion, decreased without rales or rhonchi  Cardiovascular: Rhythm regular, heart sounds  normal, no murmurs or gallops, no peripheral edema Abdomen: soft and non-tender, no hepatosplenomegaly, BS normal. Musculoskeletal: No deformities, no cyanosis or clubbing Neuro:  alert, non focal, no tremors       Assessment & Plan:

## 2017-09-30 NOTE — Assessment & Plan Note (Signed)
Prescription for Protonix 40 mg daily for 6 weeks.  Discussed diet and avoidance of alcohol

## 2017-09-30 NOTE — Patient Instructions (Signed)
Your shortness of breath may be related to weight gain or to asthma or to reflux as a trigger.  Prescription for Protonix 40 mg daily for 6 weeks.  Stay on Asmanex daily, take albuterol as needed only. Keep peak flow record twice daily and bring it in on your next visit.  Try to avoid alcohol to get to sleep. Okay to use melatonin 3-5 mg 1 hour before bedtime

## 2017-11-12 ENCOUNTER — Ambulatory Visit: Payer: BLUE CROSS/BLUE SHIELD | Admitting: Adult Health

## 2017-12-23 ENCOUNTER — Telehealth: Payer: Self-pay | Admitting: Family Medicine

## 2017-12-23 NOTE — Telephone Encounter (Signed)
Left detailed message that pt needs to contact pulmonology about the refill sample

## 2017-12-23 NOTE — Telephone Encounter (Signed)
Pt called and stated she recently lost her job and her insurance. She needs a refill on albuterol. She is requesting a sample if we have one available. Please advise pt at [phone].

## 2018-01-04 ENCOUNTER — Ambulatory Visit: Payer: Self-pay | Admitting: Pulmonary Disease

## 2018-05-27 ENCOUNTER — Observation Stay (HOSPITAL_BASED_OUTPATIENT_CLINIC_OR_DEPARTMENT_OTHER)
Admission: EM | Admit: 2018-05-27 | Discharge: 2018-05-28 | Disposition: A | Discharge status: Home / Self Care | Payer: Self-pay | Attending: Internal Medicine | Admitting: Internal Medicine

## 2018-05-27 ENCOUNTER — Emergency Department (HOSPITAL_BASED_OUTPATIENT_CLINIC_OR_DEPARTMENT_OTHER): Payer: Self-pay

## 2018-05-27 ENCOUNTER — Other Ambulatory Visit: Payer: Self-pay

## 2018-05-27 ENCOUNTER — Encounter (HOSPITAL_BASED_OUTPATIENT_CLINIC_OR_DEPARTMENT_OTHER): Payer: Self-pay | Admitting: *Deleted

## 2018-05-27 DIAGNOSIS — F419 Anxiety disorder, unspecified: Secondary | ICD-10-CM | POA: Insufficient documentation

## 2018-05-27 DIAGNOSIS — Z7982 Long term (current) use of aspirin: Secondary | ICD-10-CM | POA: Insufficient documentation

## 2018-05-27 DIAGNOSIS — Z8249 Family history of ischemic heart disease and other diseases of the circulatory system: Secondary | ICD-10-CM | POA: Insufficient documentation

## 2018-05-27 DIAGNOSIS — Z820 Family history of epilepsy and other diseases of the nervous system: Secondary | ICD-10-CM | POA: Insufficient documentation

## 2018-05-27 DIAGNOSIS — N920 Excessive and frequent menstruation with regular cycle: Principal | ICD-10-CM | POA: Insufficient documentation

## 2018-05-27 DIAGNOSIS — Z79899 Other long term (current) drug therapy: Secondary | ICD-10-CM | POA: Insufficient documentation

## 2018-05-27 DIAGNOSIS — Z833 Family history of diabetes mellitus: Secondary | ICD-10-CM | POA: Insufficient documentation

## 2018-05-27 DIAGNOSIS — D259 Leiomyoma of uterus, unspecified: Secondary | ICD-10-CM | POA: Insufficient documentation

## 2018-05-27 DIAGNOSIS — R0600 Dyspnea, unspecified: Secondary | ICD-10-CM | POA: Insufficient documentation

## 2018-05-27 DIAGNOSIS — G478 Other sleep disorders: Secondary | ICD-10-CM | POA: Insufficient documentation

## 2018-05-27 DIAGNOSIS — D500 Iron deficiency anemia secondary to blood loss (chronic): Secondary | ICD-10-CM | POA: Insufficient documentation

## 2018-05-27 DIAGNOSIS — D649 Anemia, unspecified: Secondary | ICD-10-CM

## 2018-05-27 DIAGNOSIS — Z7951 Long term (current) use of inhaled steroids: Secondary | ICD-10-CM | POA: Insufficient documentation

## 2018-05-27 DIAGNOSIS — K219 Gastro-esophageal reflux disease without esophagitis: Secondary | ICD-10-CM | POA: Insufficient documentation

## 2018-05-27 DIAGNOSIS — R0789 Other chest pain: Secondary | ICD-10-CM | POA: Insufficient documentation

## 2018-05-27 DIAGNOSIS — Z87891 Personal history of nicotine dependence: Secondary | ICD-10-CM | POA: Insufficient documentation

## 2018-05-27 DIAGNOSIS — J45909 Unspecified asthma, uncomplicated: Secondary | ICD-10-CM | POA: Insufficient documentation

## 2018-05-27 DIAGNOSIS — J4530 Mild persistent asthma, uncomplicated: Secondary | ICD-10-CM

## 2018-05-27 DIAGNOSIS — F1010 Alcohol abuse, uncomplicated: Secondary | ICD-10-CM

## 2018-05-27 LAB — COMPREHENSIVE METABOLIC PANEL
ALBUMIN: 4 g/dL (ref 3.5–5.0)
ALK PHOS: 45 U/L (ref 38–126)
ALT: 31 U/L (ref 0–44)
AST: 37 U/L (ref 15–41)
Anion gap: 10 (ref 5–15)
BUN: 14 mg/dL (ref 6–20)
CALCIUM: 9.5 mg/dL (ref 8.9–10.3)
CO2: 23 mmol/L (ref 22–32)
Chloride: 104 mmol/L (ref 98–111)
Creatinine, Ser: 0.59 mg/dL (ref 0.44–1.00)
GFR calc Af Amer: >60 mL/min (ref >60)
GFR calc non Af Amer: >60 mL/min (ref >60)
Glucose, Bld: 92 mg/dL (ref 70–99)
POTASSIUM: 3.5 mmol/L (ref 3.5–5.1)
SODIUM: 137 mmol/L (ref 135–145)
Total Bilirubin: 0.5 mg/dL (ref 0.3–1.2)
Total Protein: 7.3 g/dL (ref 6.5–8.1)

## 2018-05-27 LAB — CBC WITH DIFFERENTIAL/PLATELET
ABS IMMATURE GRANULOCYTES: 0.02 10*3/uL (ref 0.00–0.07)
Basophils Absolute: 0 10*3/uL (ref 0.0–0.1)
Basophils Relative: 1 %
EOS PCT: 1 %
Eosinophils Absolute: 0.1 10*3/uL (ref 0.0–0.5)
HCT: 24.3 % — ABNORMAL LOW (ref 36.0–46.0)
Hemoglobin: 6.7 g/dL — CL (ref 12.0–15.0)
Immature Granulocytes: 0 %
Lymphocytes Relative: 34 %
Lymphs Abs: 2.6 10*3/uL (ref 0.7–4.0)
MCH: 19.4 pg — ABNORMAL LOW (ref 26.0–34.0)
MCHC: 27.6 g/dL — AB (ref 30.0–36.0)
MCV: 70.2 fL — ABNORMAL LOW (ref 80.0–100.0)
MONO ABS: 0.6 10*3/uL (ref 0.1–1.0)
Monocytes Relative: 8 %
NEUTROS ABS: 4.3 10*3/uL (ref 1.7–7.7)
Neutrophils Relative %: 56 %
Platelets: 269 10*3/uL (ref 150–400)
RBC: 3.46 MIL/uL — AB (ref 3.87–5.11)
RDW: 17.2 % — ABNORMAL HIGH (ref 11.5–15.5)
WBC: 7.7 10*3/uL (ref 4.0–10.5)
nRBC: 0 % (ref 0.0–0.2)

## 2018-05-27 LAB — IRON AND TIBC
IRON: 20 ug/dL — AB (ref 28–170)
Saturation Ratios: 4 % — ABNORMAL LOW (ref 10.4–31.8)
TIBC: 468 ug/dL — AB (ref 250–450)
UIBC: 448 ug/dL

## 2018-05-27 LAB — RETICULOCYTES
Immature Retic Fract: 20.3 % — ABNORMAL HIGH (ref 2.3–15.9)
RBC.: 3.23 MIL/uL — ABNORMAL LOW (ref 3.87–5.11)
RETIC CT PCT: 2.1 % (ref 0.4–3.1)
Retic Count, Absolute: 68.8 10*3/uL (ref 19.0–186.0)

## 2018-05-27 LAB — VITAMIN B12: Vitamin B-12: 199 pg/mL (ref 180–914)

## 2018-05-27 LAB — FERRITIN: FERRITIN: 2 ng/mL — AB (ref 11–307)

## 2018-05-27 LAB — OCCULT BLOOD X 1 CARD TO LAB, STOOL: Fecal Occult Bld: NEGATIVE

## 2018-05-27 LAB — TROPONIN I: Troponin I: <0.03 ng/mL (ref <0.03)

## 2018-05-27 LAB — PREPARE RBC (CROSSMATCH)

## 2018-05-27 LAB — FOLATE: Folate: 13.9 ng/mL (ref >5.9)

## 2018-05-27 MED ORDER — ALBUTEROL SULFATE (2.5 MG/3ML) 0.083% IN NEBU: 2.5000 mg | INHALATION_SOLUTION | RESPIRATORY_TRACT | Status: DC | PRN

## 2018-05-27 MED ORDER — LORAZEPAM 2 MG/ML IJ SOLN: 1.0000 mg | Freq: Four times a day (QID) | INTRAMUSCULAR | Status: DC | PRN

## 2018-05-27 MED ORDER — SODIUM CHLORIDE 0.9% FLUSH: 3.0000 mL | INTRAVENOUS | Status: DC | PRN

## 2018-05-27 MED ORDER — THIAMINE HCL 100 MG/ML IJ SOLN: 100.0000 mg | Freq: Every day | INTRAMUSCULAR | Status: DC

## 2018-05-27 MED ORDER — ADULT MULTIVITAMIN W/MINERALS CH
1.0000 | ORAL_TABLET | Freq: Every day | ORAL | Status: DC
  Administered 2018-05-28: 1 via ORAL
  Filled 2018-05-27: qty 1

## 2018-05-27 MED ORDER — ACETAMINOPHEN 325 MG PO TABS: 650.0000 mg | ORAL_TABLET | Freq: Four times a day (QID) | ORAL | Status: DC | PRN

## 2018-05-27 MED ORDER — SENNOSIDES-DOCUSATE SODIUM 8.6-50 MG PO TABS: 1.0000 | ORAL_TABLET | Freq: Every evening | ORAL | Status: DC | PRN

## 2018-05-27 MED ORDER — LORAZEPAM 1 MG PO TABS: 0.0000 mg | ORAL_TABLET | Freq: Four times a day (QID) | ORAL | Status: DC

## 2018-05-27 MED ORDER — SODIUM CHLORIDE 0.9% IV SOLUTION: Freq: Once | INTRAVENOUS | Status: DC

## 2018-05-27 MED ORDER — ONDANSETRON HCL 4 MG/2ML IJ SOLN: 4.0000 mg | Freq: Four times a day (QID) | INTRAMUSCULAR | Status: DC | PRN

## 2018-05-27 MED ORDER — SODIUM CHLORIDE 0.9% FLUSH
3.0000 mL | Freq: Two times a day (BID) | INTRAVENOUS | Status: DC
  Administered 2018-05-28: 3 mL via INTRAVENOUS

## 2018-05-27 MED ORDER — SODIUM CHLORIDE 0.9 % IV SOLN: 250.0000 mL | INTRAVENOUS | Status: DC | PRN

## 2018-05-27 MED ORDER — ACETAMINOPHEN 650 MG RE SUPP: 650.0000 mg | Freq: Four times a day (QID) | RECTAL | Status: DC | PRN

## 2018-05-27 MED ORDER — FOLIC ACID 1 MG PO TABS
1.0000 mg | ORAL_TABLET | Freq: Every day | ORAL | Status: DC
  Administered 2018-05-28: 1 mg via ORAL
  Filled 2018-05-27: qty 1

## 2018-05-27 MED ORDER — ONDANSETRON HCL 4 MG PO TABS: 4.0000 mg | ORAL_TABLET | Freq: Four times a day (QID) | ORAL | Status: DC | PRN

## 2018-05-27 MED ORDER — HYDROCODONE-ACETAMINOPHEN 5-325 MG PO TABS: 1.0000 | ORAL_TABLET | ORAL | Status: DC | PRN

## 2018-05-27 MED ORDER — ALBUTEROL SULFATE HFA 108 (90 BASE) MCG/ACT IN AERS
2.0000 | INHALATION_SPRAY | RESPIRATORY_TRACT | Status: DC | PRN
  Administered 2018-05-27: 2 via RESPIRATORY_TRACT
  Filled 2018-05-27: qty 6.7

## 2018-05-27 MED ORDER — LORAZEPAM 1 MG PO TABS: 1.0000 mg | ORAL_TABLET | Freq: Four times a day (QID) | ORAL | Status: DC | PRN

## 2018-05-27 MED ORDER — VITAMIN B-1 100 MG PO TABS
100.0000 mg | ORAL_TABLET | Freq: Every day | ORAL | Status: DC
  Administered 2018-05-28: 100 mg via ORAL
  Filled 2018-05-27: qty 1

## 2018-05-27 MED ORDER — PANTOPRAZOLE SODIUM 40 MG PO TBEC
40.0000 mg | DELAYED_RELEASE_TABLET | Freq: Every day | ORAL | Status: DC
  Administered 2018-05-28: 40 mg via ORAL
  Filled 2018-05-27: qty 1

## 2018-05-27 MED ORDER — LORAZEPAM 1 MG PO TABS: 0.0000 mg | ORAL_TABLET | Freq: Two times a day (BID) | ORAL | Status: DC

## 2018-05-27 NOTE — ED Notes (Signed)
ED Provider at bedside. 

## 2018-05-27 NOTE — H&P (Signed)
History and Physical    Neelam Tiggs QIW:[id number] DOB: 03-23-1966 DOA: 05/27/2018  PCP: Girtha Rm, NP-C   Patient coming from: Home, by way of [facility]  Chief Complaint: DOE   HPI: Shari Gibson is a 52 y.o. female with medical history significant for alcohol dependence, asthma, and iron deficiency anemia attributed to menorrhagia, now presenting to the emergency department with 2 to 3 weeks of exertional dyspnea.  Patient reports insidious development of shortness of breath, mainly with exertion, notes some mild increase in her wheezing, denies any fevers, chills, chest pain, or lower extremity pain or swelling.  Denies any melena or hematochezia.  She reports a history of iron deficiency anemia that was attributed to uterine fibroid and menorrhagia, was previously taking iron supplements, but has been perimenopausal with menstruation every 3 months and stopped taking iron supplements.  Denies chest pain, lightheadedness, or headache.  [facility] ED Course: Upon arrival to the ED, patient is found to be afebrile, saturating well on room air, slightly tachycardic, and with stable blood pressure.  Chest x-ray is negative for acute cardiopulmonary disease and chemistry panel is unremarkable.  CBC is notable for hemoglobin of 6.7, down from 9.1 in February.  Fecal occult blood testing is negative.  Patient was treated with albuterol in the ED and anemia panel was sent.  Transfer to [facility] long hospital was arranged for further evaluation and management of symptomatic anemia.  Review of Systems:  All other systems reviewed and apart from HPI, are negative.  Past Medical History:  Diagnosis Date  . Anemia   . Asthma   . Fibroids   . GERD (gastroesophageal reflux disease)     Past Surgical History:  Procedure Laterality Date  . TUBAL LIGATION       reports that she quit smoking about 3 years ago. Her smoking use included cigarettes. She quit after 20.00 years  of use. She has never used smokeless tobacco. She reports that she drinks alcohol. She reports that she does not use drugs.  No Known Allergies  Family History  Adopted: Yes  Problem Relation Age of Onset  . Diabetes Maternal Aunt   . Hypertension Maternal Aunt   . Diabetes Mother   . Hypertension Mother   . Alzheimer's disease Maternal Grandmother      Prior to Admission medications   Medication Sig Start Date End Date Taking? Authorizing Provider  acetaminophen (TYLENOL) 500 MG tablet Take 500-1,000 mg by mouth every 6 (six) hours as needed for mild pain (muscle aches).   Yes [provider]  albuterol (PROVENTIL HFA;VENTOLIN HFA) 108 (90 Base) MCG/ACT inhaler Inhale 2 puffs into the lungs every 6 (six) hours as needed for wheezing or shortness of breath. 09/20/17  Yes Henson, Vickie L, NP-C  Aspirin-Salicylamide-Caffeine (BC HEADACHE POWDER PO) Take 1 packet by mouth daily as needed (headaches).   Yes [provider]  esomeprazole (NEXIUM) 20 MG capsule Take 20 mg by mouth daily at 12 noon.   Yes [provider]    Physical Exam: Vitals:   05/27/18 1930 05/27/18 1945 05/27/18 2000 05/27/18 2114  BP: (!) 136/91  (!) 151/64 138/72  Pulse: 85 91 72 71  Resp: 14 18 14 16   Temp:    98 F (36.7 C)  TempSrc:    Oral  SpO2: 100% 100% 100% 100%  Weight:    100.1 kg  Height:    5\' 7"  (1.702 m)    Constitutional: NAD, calm  Eyes: PERTLA, lids and conjunctivae normal ENMT: Mucous membranes are moist. Posterior pharynx clear of any exudate or lesions.   Neck: normal, supple, no masses, no thyromegaly Respiratory: no distress, occasional expiratory wheeze, no crackles. Normal respiratory effort.    Cardiovascular: S1 & S2 heard, regular rate and rhythm. No extremity edema.  Abdomen: No distension, no tenderness, soft. Bowel sounds normal.  Musculoskeletal: no clubbing / cyanosis. No joint deformity upper and lower extremities.    Skin: no significant rashes,  lesions, ulcers. Warm, dry, well-perfused. Neurologic: CN 2-12 grossly intact. Sensation intact. Strength 5/5 in all 4 limbs.  Psychiatric: Alert and oriented x 3. Calm, cooperative.    Labs on Admission: I have personally reviewed following labs and imaging studies  CBC: Recent Labs  Lab 05/27/18 1742  WBC 7.7  NEUTROABS 4.3  HGB 6.7*  HCT 24.3*  MCV 70.2*  PLT 696   Basic Metabolic Panel: Recent Labs  Lab 05/27/18 1742  NA 137  K 3.5  CL 104  CO2 23  GLUCOSE 92  BUN 14  CREATININE 0.59  CALCIUM 9.5   GFR: Estimated Creatinine Clearance: 101.1 mL/min (by C-G formula based on SCr of 0.59 mg/dL). Liver Function Tests: Recent Labs  Lab 05/27/18 1742  AST 37  ALT 31  ALKPHOS 45  BILITOT 0.5  PROT 7.3  ALBUMIN 4.0   No results for input(s): LIPASE, AMYLASE in the last 168 hours. No results for input(s): AMMONIA in the last 168 hours. Coagulation Profile: No results for input(s): INR, PROTIME in the last 168 hours. Cardiac Enzymes: Recent Labs  Lab 05/27/18 1742  TROPONINI <0.03   BNP (last 3 results) No results for input(s): PROBNP in the last 8760 hours. HbA1C: No results for input(s): HGBA1C in the last 72 hours. CBG: No results for input(s): GLUCAP in the last 168 hours. Lipid Profile: No results for input(s): CHOL, HDL, LDLCALC, TRIG, CHOLHDL, LDLDIRECT in the last 72 hours. Thyroid Function Tests: No results for input(s): TSH, T4TOTAL, FREET4, T3FREE, THYROIDAB in the last 72 hours. Anemia Panel: Recent Labs    05/27/18 1827  RETICCTPCT 2.1   Urine analysis:    Component Value Date/Time   COLORURINE YELLOW 04/26/2014 1730   APPEARANCEUR CLOUDY (A) 04/26/2014 1730   LABSPEC 1.012 04/26/2014 1730   PHURINE 6.0 04/26/2014 1730   GLUCOSEU NEGATIVE 04/26/2014 1730   HGBUR SMALL (A) 04/26/2014 1730   BILIRUBINUR n 01/14/2017 0950   KETONESUR 15 (A) 04/26/2014 1730   PROTEINUR n 01/14/2017 0950   PROTEINUR NEGATIVE 04/26/2014 1730    UROBILINOGEN negative (A) 01/14/2017 0950   UROBILINOGEN 0.2 04/26/2014 1730   NITRITE n 01/14/2017 0950   NITRITE NEGATIVE 04/26/2014 1730   LEUKOCYTESUR Negative 01/14/2017 0950   Sepsis Labs: @LABRCNTIP (procalcitonin:4,lacticidven:4) )No results found for this or any previous visit (from the past 240 hour(s)).   Radiological Exams on Admission: Dg Chest 2 View  Result Date: 05/27/2018 CLINICAL DATA:  Shortness of breath for 2-3 weeks.  Ex-smoker. EXAM: CHEST - 2 VIEW COMPARISON:  Chest x-ray dated 09/20/2017. FINDINGS: The heart size and mediastinal contours are within normal limits. Both lungs are clear. The visualized skeletal structures are unremarkable. IMPRESSION: No active cardiopulmonary disease. No evidence of pneumonia or pulmonary edema. Electronically Signed   By: Franki Cabot M.D.   On: 05/27/2018 19:06    EKG: Not performed.   Assessment/Plan   1. Symptomatic anemia  - Presents with 2-3 weeks of DOE  - Found to have Hgb of 6.7,  down from 9.1 in February  - FOBT is negative in ED and pt denies melena or hematochezia  - She reports hx of IDA attributed to uterine fibroid and menorrhagia, was previously taking iron supplement, but is now perimenopausal and only menstruating ~q69months, so stopped taking  - Anemia panel is pending and one unit RBC ordered for transfusion  - Check post-transfusion CBC and follow-up anemia panel    2. Asthma  - Reports some recent increased wheezing  - Not in any apparent distress  - Continue as-needed albuterol    3. Alcohol dependence  - No signs of withdrawal on admission  - Monitor with CIWA, use Ativan as needed, supplement b-vitamins     DVT prophylaxis: SCD's  Code Status: Full  Family Communication: Discussed with patient  Consults called: none Admission status: Observation     Vianne Bulls, MD [facility] Pager [phone]  If 7PM-7AM, please contact night-coverage [email or web address] Password  [email or web address]  05/27/2018, 10:11 PM

## 2018-05-27 NOTE — ED Provider Notes (Signed)
[facility] EMERGENCY DEPARTMENT Provider Note   CSN: [id number] Arrival date & time: 05/27/18  1722     History   Chief Complaint Chief Complaint  Patient presents with  . Shortness of Breath    HPI Shari Gibson is a 52 y.o. female.  The history is provided by the patient. No language interpreter was used.  Shortness of Breath      52 year old female with known history of asthma, anxiety, alcohol abuse presenting for evaluation of shortness of breath.  Patient report for the past 2 to 3 weeks she has had recurrent shortness of breath.  Shortness of breath is more presents especially with exertion such as walking long distance or walking up the steps.  Sometimes she has to rest for period of time to catch her breath.  She endorsed occasional wheezes but not all the time.  She does endorse some fatigue.  Occasionally she noticed some mild chest pressure without any significant chest pain.  She does have history of asthma but has not used inhaler for quite a while due to not having any asthma flare.  She denies any prior history of PE or DVT, no recent surgery, prolonged bedrest, active cancer, leg swelling or calf pain or taking oral hormone.  Denies any recent travel.  She is not a smoker or drinker and does not have any significant cardiac history.  She denies any specific treatment tried at home.  She does not have a rescue inhaler available.  Past Medical History:  Diagnosis Date  . Anemia   . Asthma   . Fibroids   . GERD (gastroesophageal reflux disease)     Patient Active Problem List   Diagnosis Date Noted  . 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 Gastroesophageal reflux disease 01/14/2017  . Anxiety 01/14/2017  . Alcohol abuse, daily use 01/14/2017  . History of anemia 01/14/2017  . Asthma 01/13/2017    Past Surgical History:  Procedure Laterality Date  . TUBAL LIGATION       OB History   None      Home Medications    Prior to  Admission medications   Medication Sig Start Date End Date Taking? Authorizing Provider  albuterol (PROVENTIL HFA;VENTOLIN HFA) 108 (90 Base) MCG/ACT inhaler Inhale 2 puffs into the lungs every 6 (six) hours as needed for wheezing or shortness of breath. 09/20/17   Henson, Vickie L, NP-C  mometasone (ASMANEX 60 METERED DOSES) 220 MCG/INH inhaler Inhale 2 puffs into the lungs daily. 09/20/17   Henson, Vickie L, NP-C  pantoprazole (PROTONIX) 40 MG tablet Take 1 tablet (40 mg total) by mouth daily. 09/30/17   Rigoberto Noel, MD    Family History Family History  Adopted: Yes  Problem Relation Age of Onset  . Diabetes Maternal Aunt   . Hypertension Maternal Aunt   . Diabetes Mother   . Hypertension Mother   . Alzheimer's disease Maternal Grandmother     Social History Social History   Tobacco Use  . Smoking status: Former Smoker    Years: 20.00    Types: Cigarettes    Last attempt to quit: 03/18/2015    Years since quitting: 3.1  . Smokeless tobacco: Never Used  Substance Use Topics  . Alcohol use: Yes    Alcohol/week: 0.0 standard drinks    Comment: daily wine  . Drug use: No     Allergies   Patient has no known allergies.   Review of Systems Review of Systems  Respiratory: Positive for shortness of breath.   All other systems reviewed and are negative.    Physical Exam Updated Vital Signs BP (!) 179/94   Pulse (!) 101   Temp 98.3 F (36.8 C) (Oral)   Resp 20   Ht 5\' 7"  (1.702 m)   Wt 97.1 kg   SpO2 100%   BMI 33.52 kg/m   Physical Exam  Constitutional: She appears well-developed and well-nourished. No distress.  HENT:  Head: Atraumatic.  Eyes: Conjunctivae are normal.  Neck: Neck supple.  Cardiovascular:  Mild tachycardia without murmur rubs or gallops  Pulmonary/Chest: No accessory muscle usage. Tachypnea noted. No respiratory distress. She has no decreased breath sounds. She has no wheezes. She has no rhonchi. She has no rales.  Abdominal: Soft. There is  no tenderness.  Genitourinary:  Genitourinary Comments: Chaperone present during exam.  Normal external rectum, no obvious thrombosed hemorrhoid or anal fissure.  No stool impaction.  Normal color stool on glove, normal rectal tone.  Musculoskeletal: Normal range of motion.       Right lower leg: She exhibits no edema.       Left lower leg: She exhibits no edema.  Neurological: She is alert.  Skin: No rash noted.  Psychiatric: She has a normal mood and affect.  Nursing note and vitals reviewed.    ED Treatments / Results  Labs (all labs ordered are listed, but only abnormal results are displayed) Labs Reviewed  CBC WITH DIFFERENTIAL/PLATELET - Abnormal; Notable for the following components:      Result Value   RBC 3.46 (*)    Hemoglobin 6.7 (*)    HCT 24.3 (*)    MCV 70.2 (*)    MCH 19.4 (*)    MCHC 27.6 (*)    RDW 17.2 (*)    All other components within normal limits  TROPONIN I  COMPREHENSIVE METABOLIC PANEL  OCCULT BLOOD X 1 CARD TO LAB, STOOL  VITAMIN B12  FOLATE  IRON AND TIBC  FERRITIN  RETICULOCYTES    EKG None  Radiology Dg Chest 2 View  Result Date: 05/27/2018 CLINICAL DATA:  Shortness of breath for 2-3 weeks.  Ex-smoker. EXAM: CHEST - 2 VIEW COMPARISON:  Chest x-ray dated 09/20/2017. FINDINGS: The heart size and mediastinal contours are within normal limits. Both lungs are clear. The visualized skeletal structures are unremarkable. IMPRESSION: No active cardiopulmonary disease. No evidence of pneumonia or pulmonary edema. Electronically Signed   By: Franki Cabot M.D.   On: 05/27/2018 19:06    Procedures .Critical Care Performed by: Domenic Moras, PA-C Authorized by: Domenic Moras, PA-C   Critical care provider statement:    Critical care time (minutes):  30   Critical care was time spent personally by me on the following activities:  Discussions with consultants, evaluation of patient's response to treatment, examination of patient, ordering and performing  treatments and interventions, ordering and review of laboratory studies, ordering and review of radiographic studies, pulse oximetry, re-evaluation of patient's condition, obtaining history from patient or surrogate and review of old charts   (including critical care time)  Medications Ordered in ED Medications  albuterol (PROVENTIL HFA;VENTOLIN HFA) 108 (90 Base) MCG/ACT inhaler 2 puff (2 puffs Inhalation Given 05/27/18 1853)     Initial Impression / Assessment and Plan / ED Course  I have reviewed the triage vital signs and the nursing notes.  Pertinent labs & imaging results that were available during my care of the patient were reviewed by me and  considered in my medical decision making (see chart for details).     BP (!) 158/86 (BP Location: Right Arm)   Pulse 73   Temp 98.3 F (36.8 C) (Oral)   Resp 17   Ht 5\' 7"  (1.702 m)   Wt 97.1 kg   LMP 04/24/2018 (Within Days)   SpO2 100%   BMI 33.52 kg/m    Final Clinical Impressions(s) / ED Diagnoses   Final diagnoses:  Symptomatic anemia    ED Discharge Orders    None     5:55 PM Patient presents with exertional shortness of breath which is been ongoing issue for the past few weeks.  She does have history of asthma however she is not actively wheezing.  She endorsed occasional dry nonproductive cough but no infectious symptoms.  Although she does not have any significant risk factor for PE, she is a bit tachycardic and therefore, I d-dimer have been ordered.  Work-up initiated.  6:49 PM Lab is remarkable for a low hemoglobin of 6.7 and hematocrit of 24.3, MCV is 70 which signify microcytic anemia.  She denies any abnormal bleeding and Hemoccult was obtained but no frank blood.  She does have history of uterine fibroid and report having very heavy menstruation.  She has not had a menstrual period recently.  [facility] Dr. Myna Hidalgo have been consulted and agrees to accept pt to [facility] for admission of symptomatic anemia. She  will benefit from blood transfusion, which will be perform at [facility].    Domenic Moras, PA-C 05/27/18 1952    Quintella Reichert, MD 05/28/18 [phone]

## 2018-05-27 NOTE — ED Triage Notes (Signed)
Pt c/o SOB x 2 weeks HX asthma also states she has chest tightness " at time"

## 2018-05-27 NOTE — ED Notes (Signed)
[facility] notified (Taryn) - patient ready for transport 

## 2018-05-28 ENCOUNTER — Encounter (HOSPITAL_COMMUNITY): Payer: Self-pay | Admitting: Family Medicine

## 2018-05-28 DIAGNOSIS — F419 Anxiety disorder, unspecified: Secondary | ICD-10-CM

## 2018-05-28 LAB — BASIC METABOLIC PANEL
ANION GAP: 9 (ref 5–15)
BUN: 10 mg/dL (ref 6–20)
CALCIUM: 8.7 mg/dL — AB (ref 8.9–10.3)
CO2: 23 mmol/L (ref 22–32)
Chloride: 108 mmol/L (ref 98–111)
Creatinine, Ser: 0.5 mg/dL (ref 0.44–1.00)
Glucose, Bld: 82 mg/dL (ref 70–99)
POTASSIUM: 4 mmol/L (ref 3.5–5.1)
Sodium: 140 mmol/L (ref 135–145)

## 2018-05-28 LAB — PREPARE RBC (CROSSMATCH)

## 2018-05-28 LAB — CBC WITH DIFFERENTIAL/PLATELET
Abs Immature Granulocytes: 0.03 10*3/uL (ref 0.00–0.07)
BASOS ABS: 0 10*3/uL (ref 0.0–0.1)
Basophils Relative: 1 %
EOS ABS: 0.1 10*3/uL (ref 0.0–0.5)
EOS PCT: 2 %
HCT: 27.6 % — ABNORMAL LOW (ref 36.0–46.0)
HEMOGLOBIN: 7.9 g/dL — AB (ref 12.0–15.0)
Immature Granulocytes: 1 %
LYMPHS ABS: 2.1 10*3/uL (ref 0.7–4.0)
Lymphocytes Relative: 33 %
MCH: 20.4 pg — AB (ref 26.0–34.0)
MCHC: 28.6 g/dL — ABNORMAL LOW (ref 30.0–36.0)
MCV: 71.1 fL — AB (ref 80.0–100.0)
MONOS PCT: 8 %
Monocytes Absolute: 0.5 10*3/uL (ref 0.1–1.0)
NRBC: 0 % (ref 0.0–0.2)
Neutro Abs: 3.6 10*3/uL (ref 1.7–7.7)
Neutrophils Relative %: 55 %
Platelets: 241 10*3/uL (ref 150–400)
RBC: 3.88 MIL/uL (ref 3.87–5.11)
RDW: 18.2 % — ABNORMAL HIGH (ref 11.5–15.5)
WBC: 6.3 10*3/uL (ref 4.0–10.5)

## 2018-05-28 LAB — ABO/RH: ABO/RH(D): B POS

## 2018-05-28 MED ORDER — SODIUM CHLORIDE 0.9% IV SOLUTION
Freq: Once | INTRAVENOUS | Status: AC
  Administered 2018-05-28: 12:00:00 via INTRAVENOUS

## 2018-05-28 MED ORDER — THIAMINE HCL 100 MG PO TABS: 100.0000 mg | ORAL_TABLET | Freq: Every day | ORAL | 0 refills | Status: DC

## 2018-05-28 MED ORDER — POLYSACCHARIDE IRON COMPLEX 150 MG PO CAPS
150.0000 mg | ORAL_CAPSULE | Freq: Every day | ORAL | Status: DC
  Filled 2018-05-28: qty 1

## 2018-05-28 MED ORDER — FOLIC ACID 1 MG PO TABS: 1.0000 mg | ORAL_TABLET | Freq: Every day | ORAL | 0 refills | Status: DC

## 2018-05-28 MED ORDER — ADULT MULTIVITAMIN W/MINERALS CH: 1.0000 | ORAL_TABLET | Freq: Every day | ORAL | 0 refills | Status: DC

## 2018-05-28 MED ORDER — POLYSACCHARIDE IRON COMPLEX 150 MG PO CAPS: 150.0000 mg | ORAL_CAPSULE | Freq: Every day | ORAL | 0 refills | Status: DC

## 2018-05-28 NOTE — Progress Notes (Signed)
Per physician order, monitor oxygen saturation while ambulating. Patient ambulated in hall. Oxygen saturation levels ranged from 96%-100% on room air.

## 2018-05-28 NOTE — Progress Notes (Signed)
Pt leaving with her daughter. Alert, oriented, and without c/o. Discharge instructions/prescriptions given/explained with pt verbalizing understanding.  Pt aware of followup. Work [employer]ar[employer] given.

## 2018-05-28 NOTE — Discharge Summary (Signed)
Physician Discharge Summary  Shari Gibson IPJ:[id number] DOB: 1966-03-30 DOA: 05/27/2018  PCP: Girtha Rm, NP-C  Admit date: 05/27/2018 Discharge date: 05/28/2018  Admitted From: Home Disposition: Home  Recommendations for Outpatient Follow-up:  1. Follow up with PCP in 1-2 weeks 2. Follow up with OB-GYN for outpatient evaluation of Menorrhagia 3. Follow up with Gastroenterology for Outpatient Colonoscopy 4. Please obtain CMP/CBC, Mag, Phos in one week 5. Please follow up on the following pending results:  Home Health: No Equipment/Devices: None  Discharge Condition: Stable CODE STATUS: FULL CODE Diet recommendation: Heart Healthy Diet  Brief/Interim Summary: HPI per Dr. Christia Reading Opyd on 05/27/18  HPI: Shari Gibson is a 52 y.o. female with medical history significant for alcohol dependence, asthma, and iron deficiency anemia attributed to menorrhagia, now presenting to the emergency department with 2 to 3 weeks of exertional dyspnea.  Patient reports insidious development of shortness of breath, mainly with exertion, notes some mild increase in her wheezing, denies any fevers, chills, chest pain, or lower extremity pain or swelling.  Denies any melena or hematochezia.  She reports a history of iron deficiency anemia that was attributed to uterine fibroid and menorrhagia, was previously taking iron supplements, but has been perimenopausal with menstruation every 3 months and stopped taking iron supplements.  Denies chest pain, lightheadedness, or headache.  [facility] ED Course: Upon arrival to the ED, patient is found to be afebrile, saturating well on room air, slightly tachycardic, and with stable blood pressure.  Chest x-ray is negative for acute cardiopulmonary disease and chemistry panel is unremarkable.  CBC is notable for hemoglobin of 6.7, down from 9.1 in February.  Fecal occult blood testing is negative.  Patient was treated with albuterol in  the ED and anemia panel was sent.  Transfer to [facility] was arranged for further evaluation and management of symptomatic anemia.  **She is transfused 1 unit of blood and improved and was no longer symptomatic.  However, we will transfuse 1 more unit prior to charge as her hemoglobin is still below 8.  She improved and was deemed medically stable to be discharged and she is recommended to follow-up with PCP, OB/GYN, as well as gastroenterology for outpatient colonoscopy.  Also counseled her on alcohol usage and recommended cutting down.   Discharge Diagnoses:  Principal Problem:   Symptomatic anemia Active Problems:   Asthma   Anxiety   Alcohol abuse, daily use  Symptomatic Anemia in a Patient with a Hx of Menorrhagia   - Presents with 2-3 weeks of DOE  - Found to have Hgb of 6.7, down from 9.1 in February  - FOBT is negative in ED and pt denies melena or hematochezia  - She reported hx of IDA attributed to uterine fibroid and menorrhagia, was previously taking iron supplement, but is now perimenopausal and only menstruating ~q38months, so stopped taking Iron Supplements -Restarted Iron Supplements and started Iron Polysaccharides 150 mg po Capsule  - Anemia panel done and showed an iron level of 20, TIBC 448, TIBC 468, saturation ratios of 4%, ferritin level of 2, folate level 13.9, and vitamin B12 level 199 -Hemoglobin/hematocrit on admission was 6.7/24.3 and 1 unit was ordered for transfusion and improved to 7.9/27.6 -We will order one more additional unit prior to discharge to have a hemoglobin above 8 -She is to repeat CBC as an outpatient with primary care physician and recommended following up with OB/GYN as well and she is in agreement.  Patient is  no longer symptomatic -Ambulate to the halls and did not desaturate  Asthma  - Reported some recent increased wheezing  - Not in any apparent distress  - Continue as-needed albuterol for now and have resumed her home albuterol  regimen  Alcohol Dependence  - No signs of withdrawal on admission  - Monitored with CIWA, use Ativan as needed, supplement b-vitamins - Currently still not in withdrawal and will continue folic acid, multivitamin and thiamine at discharge - Recommended cutting down on alcohol usage and avoiding alcohol with Tylenol usage.   Discharge Instructions Discharge Instructions    Call MD for:  difficulty breathing, headache or visual disturbances   Complete by:  As directed    Call MD for:  extreme fatigue   Complete by:  As directed    Call MD for:  hives   Complete by:  As directed    Call MD for:  persistant dizziness or light-headedness   Complete by:  As directed    Call MD for:  persistant nausea and vomiting   Complete by:  As directed    Call MD for:  redness, tenderness, or signs of infection (pain, swelling, redness, odor or green/yellow discharge around incision site)   Complete by:  As directed    Call MD for:  severe uncontrolled pain   Complete by:  As directed    Call MD for:  temperature >100.4   Complete by:  As directed    Diet - low sodium heart healthy   Complete by:  As directed    Discharge instructions   Complete by:  As directed    You were cared for by a hospitalist during your hospital stay. If you have any questions about your discharge medications or the care you received while you were in the hospital after you are discharged, you can call the unit and ask to speak with the hospitalist on call if the hospitalist that took care of you is not available. Once you are discharged, your primary care physician will handle any further medical issues. Please note that NO REFILLS for any discharge medications will be authorized once you are discharged, as it is imperative that you return to your primary care physician (or establish a relationship with a primary care physician if you do not have one) for your aftercare needs so that they can reassess your need for  medications and monitor your lab values.  Follow up with PCP, OB-GYN, and Gastroenterology as an outpatient. Try to limit and cut down on drinking. Take all medications as prescribed. If symptoms change or worsen please return to the ED for evaluation   Increase activity slowly   Complete by:  As directed      Allergies as of 05/28/2018   No Known Allergies     Medication List    STOP taking these medications   BC HEADACHE POWDER PO     TAKE these medications   acetaminophen 500 MG tablet Commonly known as:  TYLENOL Take 500-1,000 mg by mouth every 6 (six) hours as needed for mild pain (muscle aches).   albuterol 108 (90 Base) MCG/ACT inhaler Commonly known as:  PROVENTIL HFA;VENTOLIN HFA Inhale 2 puffs into the lungs every 6 (six) hours as needed for wheezing or shortness of breath.   esomeprazole 20 MG capsule Commonly known as:  NEXIUM Take 20 mg by mouth daily at 12 noon.   folic acid 1 MG tablet Commonly known as:  FOLVITE Take 1 tablet (1  mg total) by mouth daily. Start taking on:  05/29/2018   iron polysaccharides 150 MG capsule Commonly known as:  NIFEREX Take 1 capsule (150 mg total) by mouth daily.   multivitamin with minerals Tabs tablet Take 1 tablet by mouth daily. Start taking on:  05/29/2018   thiamine 100 MG tablet Take 1 tablet (100 mg total) by mouth daily. Start taking on:  05/29/2018      Follow-up Information    Henson, Vickie L, NP-C Follow up.   Specialty:  Family Medicine Contact information: [place] [address] [phone]          No Known Allergies  Consultations:  None  Procedures/Studies: Dg Chest 2 View  Result Date: 05/27/2018 CLINICAL DATA:  Shortness of breath for 2-3 weeks.  Ex-smoker. EXAM: CHEST - 2 VIEW COMPARISON:  Chest x-ray dated 09/20/2017. FINDINGS: The heart size and mediastinal contours are within normal limits. Both lungs are clear. The visualized skeletal structures are  unremarkable. IMPRESSION: No active cardiopulmonary disease. No evidence of pneumonia or pulmonary edema. Electronically Signed   By: Franki Cabot M.D.   On: 05/27/2018 19:06   Subjective: Seen and examined at bedside and was doing better.  No chest pain, lightheadedness or dizziness.  Denies any shortness of breath and states that she feels much better than yesterday.  Ready to go home.  No other concerns or complaints at the time  Discharge Exam: Vitals:   05/28/18 1149 05/28/18 1219  BP: (!) 147/75 (!) 141/93  Pulse: 74 72  Resp: 15 16  Temp: 98.3 F (36.8 C) 98.3 F (36.8 C)  SpO2: 100% 100%   Vitals:   05/28/18 0549 05/28/18 1100 05/28/18 1149 05/28/18 1219  BP: 118/89  (!) 147/75 (!) 141/93  Pulse: 79  74 72  Resp: 16  15 16   Temp: 98.5 F (36.9 C)  98.3 F (36.8 C) 98.3 F (36.8 C)  TempSrc: Oral  Oral Oral  SpO2: 100% 100% 100% 100%  Weight:      Height:       General: Pt is alert, awake, not in acute distress Cardiovascular: RRR, S1/S2 +, no rubs, no gallops Respiratory: Slightly diminished bilaterally, no wheezing, no rhonchi Abdominal: Soft, NT, ND, bowel sounds + Extremities: no edema, no cyanosis  The results of significant diagnostics from this hospitalization (including imaging, microbiology, ancillary and laboratory) are listed below for reference.    Microbiology: No results found for this or any previous visit (from the past 240 hour(s)).   Labs: BNP (last 3 results) No results for input(s): BNP in the last 8760 hours. Basic Metabolic Panel: Recent Labs  Lab 05/27/18 1742 05/28/18 0640  NA 137 140  K 3.5 4.0  CL 104 108  CO2 23 23  GLUCOSE 92 82  BUN 14 10  CREATININE 0.59 0.50  CALCIUM 9.5 8.7*   Liver Function Tests: Recent Labs  Lab 05/27/18 1742  AST 37  ALT 31  ALKPHOS 45  BILITOT 0.5  PROT 7.3  ALBUMIN 4.0   No results for input(s): LIPASE, AMYLASE in the last 168 hours. No results for input(s): AMMONIA in the last 168  hours. CBC: Recent Labs  Lab 05/27/18 1742 05/28/18 0640  WBC 7.7 6.3  NEUTROABS 4.3 3.6  HGB 6.7* 7.9*  HCT 24.3* 27.6*  MCV 70.2* 71.1*  PLT 269 241   Cardiac Enzymes: Recent Labs  Lab 05/27/18 1742  TROPONINI <0.03   BNP: Invalid input(s): POCBNP CBG: No results for  input(s): GLUCAP in the last 168 hours. D-Dimer No results for input(s): DDIMER in the last 72 hours. Hgb A1c No results for input(s): HGBA1C in the last 72 hours. Lipid Profile No results for input(s): CHOL, HDL, LDLCALC, TRIG, CHOLHDL, LDLDIRECT in the last 72 hours. Thyroid function studies No results for input(s): TSH, T4TOTAL, T3FREE, THYROIDAB in the last 72 hours.  Invalid input(s): FREET3 Anemia work up Recent Labs    05/27/18 1827  VITAMINB12 199  FOLATE 13.9  FERRITIN 2*  TIBC 468*  IRON 20*  RETICCTPCT 2.1   Urinalysis    Component Value Date/Time   COLORURINE YELLOW 04/26/2014 1730   APPEARANCEUR CLOUDY (A) 04/26/2014 1730   LABSPEC 1.012 04/26/2014 1730   PHURINE 6.0 04/26/2014 1730   GLUCOSEU NEGATIVE 04/26/2014 1730   HGBUR SMALL (A) 04/26/2014 1730   BILIRUBINUR n 01/14/2017 0950   KETONESUR 15 (A) 04/26/2014 1730   PROTEINUR n 01/14/2017 0950   PROTEINUR NEGATIVE 04/26/2014 1730   UROBILINOGEN negative (A) 01/14/2017 0950   UROBILINOGEN 0.2 04/26/2014 1730   NITRITE n 01/14/2017 0950   NITRITE NEGATIVE 04/26/2014 1730   LEUKOCYTESUR Negative 01/14/2017 0950   Sepsis Labs Invalid input(s): PROCALCITONIN,  WBC,  LACTICIDVEN Microbiology No results found for this or any previous visit (from the past 240 hour(s)).  Time coordinating discharge: 35 minutes  SIGNED:  Kerney Elbe, DO [facility] 05/28/2018, 12:24 PM Pager is on [place]  If 7PM-7AM, please contact night-coverage [email or web address] Password [email or web address]

## 2018-05-30 LAB — BPAM RBC
Blood Product Expiration Date: 201911062359
Blood Product Expiration Date: 201911072359
ISSUE DATE / TIME: 201910121158
ISSUE DATE / TIME: 201910120040
UNIT TYPE AND RH: 7300
Unit Type and Rh: 7300

## 2018-05-30 LAB — TYPE AND SCREEN
ABO/RH(D): B POS
Antibody Screen: NEGATIVE
UNIT DIVISION: 0
Unit division: 0

## 2018-06-09 ENCOUNTER — Encounter: Payer: Self-pay | Admitting: Family Medicine

## 2018-06-09 ENCOUNTER — Ambulatory Visit (INDEPENDENT_AMBULATORY_CARE_PROVIDER_SITE_OTHER): Payer: Self-pay | Admitting: Family Medicine

## 2018-06-09 VITALS — BP 120/76 | HR 72 | Wt 222.4 lb

## 2018-06-09 DIAGNOSIS — Z86018 Personal history of other benign neoplasm: Secondary | ICD-10-CM

## 2018-06-09 DIAGNOSIS — D509 Iron deficiency anemia, unspecified: Secondary | ICD-10-CM

## 2018-06-09 DIAGNOSIS — R350 Frequency of micturition: Secondary | ICD-10-CM

## 2018-06-09 DIAGNOSIS — R8290 Unspecified abnormal findings in urine: Secondary | ICD-10-CM

## 2018-06-09 DIAGNOSIS — J4540 Moderate persistent asthma, uncomplicated: Secondary | ICD-10-CM

## 2018-06-09 DIAGNOSIS — N926 Irregular menstruation, unspecified: Secondary | ICD-10-CM

## 2018-06-09 LAB — POCT URINALYSIS DIP (PROADVANTAGE DEVICE)
Bilirubin, UA: NEGATIVE
Glucose, UA: NEGATIVE mg/dL
Ketones, POC UA: NEGATIVE mg/dL
Leukocytes, UA: NEGATIVE
NITRITE UA: NEGATIVE
PH UA: 7 (ref 5.0–8.0)
Protein Ur, POC: NEGATIVE mg/dL
RBC UA: NEGATIVE
Specific Gravity, Urine: 1.01
UUROB: NEGATIVE

## 2018-06-09 NOTE — Patient Instructions (Signed)
Continue on daily iron supplement. If you have an issue with constipation you can add a stool softener.   Call and schedule with either the [facility] [phone] or the Primary Care clinic on [facility] which is [facility] as well, [phone]  Use the once daily Breo inhaler to help get your asthma under better control. Use the albuterol inhaler as needed. The goal is that you will not need this daily.   Cut back on the energy drinks, this may be causing urinary frequency. Stay well hydrated with water.   Call and see your OB/GYN for heavy periods and fibroids.

## 2018-06-09 NOTE — Progress Notes (Signed)
Subjective:    Patient ID: Shari Gibson, female    DOB: Sep 15, 1965, 52 y.o.   MRN: [id number]  HPI Chief Complaint  Patient presents with  . hospital follow-up    hospital follow-up   She is here for a hospital follow up for symptomatic anemia. Hospitalized for one night and discharged on 05/28/2018. She received 2 units of blood and her Hgb did respond. She denies having dyspnea or craving ice as she was prior to going to the ED. Feeling much better.  History of fibroids and heavy menses. Has not followed up with her OB/GYN. She recently lost her health insurance. Is planning to start a new job soon and have insurance again.   Reports taking oral iron once daily. She was not taking it prior to ED visit even though she has a long history of anemia.   She also complains of a 4-6 week history of malodorous urine and urinary frequency. Urinating 4-5 times per night.  Started drinking energy drinks. Reports stopping alcohol 2-3 months ago.   Asthma has been worse than usual and she reports increased wheezing States she has been using albuterol inhaler 3-4 times per day. Ran out of daily maintenance inhaler.  Asthma was dagnosed in childhood. Did not have any issues until 2-3 years ago.  Triggers are with exertion, changes in season and temperatures or with URIs.   Denies fever, chills, dizziness, chest pain, palpitations, shortness of breath, abdominal pain, N/V/D, urinary symptoms, LE edema.   Rectal exam in the ED and negative.   She is working 2 jobs and diet is unhealthy.   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.  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   Reviewed allergies, medications, past medical, surgical, family, and social history.    Review of Systems Pertinent positives and negatives in the history of present illness.     Objective:   Physical Exam  Constitutional: She is oriented to person, place, and time. She  appears well-developed and well-nourished. No distress.  HENT:  Nose: Nose normal.  Mouth/Throat: Oropharynx is clear and moist.  Eyes: Pupils are equal, round, and reactive to light. Conjunctivae are normal.  Neck: Normal range of motion. Neck supple.  Cardiovascular: Normal rate, regular rhythm, normal heart sounds and intact distal pulses.  Pulmonary/Chest: Effort normal and breath sounds normal.  Genitourinary:  Genitourinary Comments: Refuses rectal exam. States she has exam in hospital that was negative for blood.   Neurological: She is alert and oriented to person, place, and time.  Skin: Skin is warm and dry. Capillary refill takes less than 2 seconds. No pallor.  Psychiatric: She has a normal mood and affect. Her behavior is normal. Thought content normal.   BP 120/76   Pulse 72   Wt 222 lb 6.4 oz (100.9 kg)   LMP 04/25/2018 (Approximate)   SpO2 99%   BMI 34.83 kg/m       Assessment & Plan:  Iron deficiency anemia, unspecified iron deficiency anemia type - Plan: CBC with Differential/Platelet, Iron, TIBC and Ferritin Panel, Magnesium, Comprehensive metabolic panel  Moderate persistent asthma without complication  Urinary frequency - Plan: POCT Urinalysis DIP (Proadvantage Device)  Malodorous urine - Plan: POCT Urinalysis DIP (Proadvantage Device)  History of uterine fibroid  Irregular menses - Plan: TSH  She appears to be asymptomatic in regards to anemia. Taking daily oral iron. Will recheck labs today. Advised her to follow up with OB/GYN  and GI as recommended to look for source of blood loss.  UA neg. Advised to stop energy drinks. Suspect increased caffeine is causing increased urination.  POCT random glucose 88 Asthma is not well controlled. Breo samples given with instructions to rinse her mouth after use. Use albuterol as needed but this should lessen.  Congratulated her on stopping alcohol. Encouraged her to go to [employer].  She was given [facility] and  Wellness and Primary Care clinic on [place]'s information since she does not have health insurance.  Follow up pending labs.  Continue iron Follow up with OB/GYN and GI

## 2018-06-10 LAB — IRON,TIBC AND FERRITIN PANEL
Ferritin: 13 ng/mL — ABNORMAL LOW (ref 15–150)
Iron Saturation: 5 % — CL (ref 15–55)
Iron: 20 ug/dL — ABNORMAL LOW (ref 27–159)
Total Iron Binding Capacity: 436 ug/dL (ref 250–450)
UIBC: 416 ug/dL (ref 131–425)

## 2018-06-10 LAB — CBC WITH DIFFERENTIAL/PLATELET
BASOS ABS: 0.1 10*3/uL (ref 0.0–0.2)
Basos: 1 %
EOS (ABSOLUTE): 0.1 10*3/uL (ref 0.0–0.4)
Eos: 1 %
Hematocrit: 33.9 % — ABNORMAL LOW (ref 34.0–46.6)
Hemoglobin: 10 g/dL — ABNORMAL LOW (ref 11.1–15.9)
Immature Grans (Abs): 0.1 10*3/uL (ref 0.0–0.1)
Immature Granulocytes: 1 %
LYMPHS ABS: 2.8 10*3/uL (ref 0.7–3.1)
Lymphs: 33 %
MCH: 21.8 pg — ABNORMAL LOW (ref 26.6–33.0)
MCHC: 29.5 g/dL — AB (ref 31.5–35.7)
MCV: 74 fL — AB (ref 79–97)
MONOCYTES: 6 %
MONOS ABS: 0.5 10*3/uL (ref 0.1–0.9)
NEUTROS ABS: 5 10*3/uL (ref 1.4–7.0)
Neutrophils: 58 %
PLATELETS: 514 10*3/uL — AB (ref 150–450)
RBC: 4.58 x10E6/uL (ref 3.77–5.28)
RDW: 22.3 % — ABNORMAL HIGH (ref 12.3–15.4)
WBC: 8.6 10*3/uL (ref 3.4–10.8)

## 2018-06-10 LAB — COMPREHENSIVE METABOLIC PANEL
ALBUMIN: 4.6 g/dL (ref 3.5–5.5)
ALK PHOS: 59 IU/L (ref 39–117)
ALT: 33 IU/L — AB (ref 0–32)
AST: 25 IU/L (ref 0–40)
Albumin/Globulin Ratio: 1.6 (ref 1.2–2.2)
BUN/Creatinine Ratio: 11 (ref 9–23)
BUN: 7 mg/dL (ref 6–24)
Bilirubin Total: <0.2 mg/dL (ref 0.0–1.2)
CHLORIDE: 100 mmol/L (ref 96–106)
CO2: 24 mmol/L (ref 20–29)
CREATININE: 0.62 mg/dL (ref 0.57–1.00)
Calcium: 9.5 mg/dL (ref 8.7–10.2)
GFR calc non Af Amer: 105 mL/min/{1.73_m2} (ref >59)
GFR, EST AFRICAN AMERICAN: 121 mL/min/{1.73_m2} (ref >59)
GLUCOSE: 84 mg/dL (ref 65–99)
Globulin, Total: 2.8 g/dL (ref 1.5–4.5)
Potassium: 4.5 mmol/L (ref 3.5–5.2)
Sodium: 139 mmol/L (ref 134–144)
TOTAL PROTEIN: 7.4 g/dL (ref 6.0–8.5)

## 2018-06-10 LAB — MAGNESIUM: MAGNESIUM: 1.9 mg/dL (ref 1.6–2.3)

## 2018-06-10 LAB — TSH: TSH: 0.914 u[IU]/mL (ref 0.450–4.500)

## 2018-11-04 ENCOUNTER — Ambulatory Visit: Payer: Self-pay | Admitting: Nurse Practitioner

## 2018-11-15 ENCOUNTER — Telehealth: Payer: Self-pay | Admitting: Family Medicine

## 2018-11-15 NOTE — Telephone Encounter (Signed)
Pt called because she is very concerned with the corona virus and the fact that she has asthma, and at her job she works for a [employer] and she is around people constantly and around new people constantly and is very concerned about getting sick especially with the concern of her asthma and would like a note taking her out of work.

## 2018-11-15 NOTE — Telephone Encounter (Signed)
Pt was notified of results

## 2018-11-15 NOTE — Telephone Encounter (Signed)
I understand her concerns. Unfortunately, I cannot take her out of work due her having asthma. I discussed this with my supervising physician and we cannot legally do this.  I recommend that she wash her hands frequently, try to stay away from sick people who are coughing and if we need to treat her asthma any differently let me know. If she is needing to use her albuterol inhaler often then keep me posted.

## 2018-11-18 ENCOUNTER — Other Ambulatory Visit: Payer: Self-pay

## 2018-11-18 ENCOUNTER — Telehealth: Payer: Self-pay | Admitting: Family Medicine

## 2018-11-18 DIAGNOSIS — J4530 Mild persistent asthma, uncomplicated: Secondary | ICD-10-CM

## 2018-11-18 MED ORDER — ALBUTEROL SULFATE HFA 108 (90 BASE) MCG/ACT IN AERS: 2.0000 | INHALATION_SPRAY | Freq: Four times a day (QID) | RESPIRATORY_TRACT | 1 refills | Status: AC | PRN

## 2018-11-18 NOTE — Telephone Encounter (Signed)
lvm for pt advising med has been sent. Shari Gibson

## 2018-11-18 NOTE — Telephone Encounter (Signed)
Please send this to the requested pharmacy for her. Thanks.

## 2018-11-18 NOTE — Telephone Encounter (Signed)
[facility]. Pt called for refills of albuterol. She is not having any issues just needs a refill. Please [facility]. Please send to [facility] [address]. Pt can be reached at [phone]. Sending to [employer] due to [place] not being in office today.

## 2018-12-16 ENCOUNTER — Ambulatory Visit: Payer: Self-pay | Admitting: Family Medicine

## 2019-01-04 ENCOUNTER — Telehealth: Payer: Self-pay | Admitting: Family Medicine

## 2019-01-04 NOTE — Telephone Encounter (Signed)
Called pt reached vm needs cpe and fasting labs.

## 2019-04-27 ENCOUNTER — Encounter: Payer: Self-pay | Admitting: Family Medicine

## 2019-05-22 ENCOUNTER — Encounter: Payer: Self-pay | Admitting: Family Medicine

## 2019-05-22 ENCOUNTER — Telehealth: Payer: Self-pay | Admitting: Internal Medicine

## 2019-05-22 DIAGNOSIS — Z0000 Encounter for general adult medical examination without abnormal findings: Secondary | ICD-10-CM

## 2019-05-22 NOTE — Telephone Encounter (Signed)
Forward to [employer]

## 2019-05-22 NOTE — Telephone Encounter (Signed)

## 2019-05-22 NOTE — Telephone Encounter (Signed)
No show CPE. Fee should be applied.

## 2019-05-23 ENCOUNTER — Encounter: Payer: Self-pay | Admitting: Family Medicine

## 2019-05-23 NOTE — Telephone Encounter (Signed)
Will you add fee to pt account please I sent pt letter

## 2019-05-25 ENCOUNTER — Other Ambulatory Visit: Payer: Self-pay | Admitting: Family Medicine

## 2019-05-25 DIAGNOSIS — J4530 Mild persistent asthma, uncomplicated: Secondary | ICD-10-CM

## 2019-05-25 NOTE — Telephone Encounter (Signed)
Pt has an appt on 10/21 and has enough med til then

## 2019-06-07 ENCOUNTER — Encounter: Payer: Managed Care, Other (non HMO) | Admitting: Family Medicine

## 2019-06-13 ENCOUNTER — Encounter: Payer: Self-pay | Admitting: Family Medicine

## 2020-01-02 DIAGNOSIS — N926 Irregular menstruation, unspecified: Secondary | ICD-10-CM | POA: Insufficient documentation

## 2020-02-13 ENCOUNTER — Other Ambulatory Visit: Payer: Self-pay | Admitting: Obstetrics and Gynecology

## 2020-02-13 DIAGNOSIS — R1909 Other intra-abdominal and pelvic swelling, mass and lump: Secondary | ICD-10-CM

## 2020-02-13 DIAGNOSIS — N838 Other noninflammatory disorders of ovary, fallopian tube and broad ligament: Secondary | ICD-10-CM

## 2020-02-14 ENCOUNTER — Other Ambulatory Visit: Payer: Self-pay | Admitting: Obstetrics and Gynecology

## 2020-02-20 ENCOUNTER — Ambulatory Visit
Admission: RE | Admit: 2020-02-20 | Discharge: 2020-02-20 | Disposition: A | Discharge status: Home / Self Care | Payer: Managed Care, Other (non HMO) | Source: Ambulatory Visit | Attending: Obstetrics and Gynecology | Admitting: Obstetrics and Gynecology

## 2020-02-20 ENCOUNTER — Other Ambulatory Visit: Payer: Self-pay

## 2020-02-20 DIAGNOSIS — R1909 Other intra-abdominal and pelvic swelling, mass and lump: Secondary | ICD-10-CM

## 2020-02-20 DIAGNOSIS — N838 Other noninflammatory disorders of ovary, fallopian tube and broad ligament: Secondary | ICD-10-CM

## 2020-02-20 MED ORDER — GADOBENATE DIMEGLUMINE 529 MG/ML IV SOLN
20.0000 mL | Freq: Once | INTRAVENOUS | Status: AC | PRN
  Administered 2020-02-20: 20 mL via INTRAVENOUS

## 2020-02-28 HISTORY — PX: COLONOSCOPY: SHX174

## 2020-07-29 ENCOUNTER — Ambulatory Visit (HOSPITAL_COMMUNITY): Payer: Self-pay

## 2020-07-29 ENCOUNTER — Ambulatory Visit: Payer: Self-pay

## 2020-07-30 ENCOUNTER — Ambulatory Visit (HOSPITAL_COMMUNITY): Payer: Self-pay

## 2020-09-30 ENCOUNTER — Ambulatory Visit: Payer: Managed Care, Other (non HMO) | Admitting: Primary Care

## 2020-10-02 ENCOUNTER — Telehealth: Payer: Self-pay | Admitting: Primary Care

## 2020-10-02 ENCOUNTER — Ambulatory Visit: Payer: Managed Care, Other (non HMO) | Admitting: Primary Care

## 2020-10-02 NOTE — Telephone Encounter (Signed)
Pt last seen at office 09/30/2017. Since it has been 3 years since pt was last seen, pt needs to see MD for consult appt.  Pt is currently scheduled for OV with Beth 10/02/20 at 3:30. Attempted to call pt in regards to her appt to see if we could get it rescheduled with MD for consult appt but unable to reach. Left message for her to return call. Made a note in appt section of pt's appt with this info as well.

## 2020-10-02 NOTE — Telephone Encounter (Signed)
Pt's appt has been moved to 10/15/20 for her to have consult visit with Dr. Silas Flood. Nothing further needed.

## 2020-10-15 ENCOUNTER — Institutional Professional Consult (permissible substitution): Payer: Managed Care, Other (non HMO) | Admitting: Pulmonary Disease

## 2021-08-17 HISTORY — PX: UPPER GASTROINTESTINAL ENDOSCOPY: SHX188

## 2022-01-27 ENCOUNTER — Emergency Department (HOSPITAL_COMMUNITY): Payer: Managed Care, Other (non HMO)

## 2022-01-27 ENCOUNTER — Encounter (HOSPITAL_COMMUNITY): Payer: Self-pay | Admitting: Emergency Medicine

## 2022-01-27 ENCOUNTER — Other Ambulatory Visit: Payer: Self-pay

## 2022-01-27 ENCOUNTER — Observation Stay (HOSPITAL_COMMUNITY): Payer: Managed Care, Other (non HMO)

## 2022-01-27 ENCOUNTER — Observation Stay (HOSPITAL_COMMUNITY)
Admission: EM | Admit: 2022-01-27 | Discharge: 2022-01-28 | Disposition: A | Discharge status: Home / Self Care | Payer: Managed Care, Other (non HMO) | Attending: Internal Medicine | Admitting: Internal Medicine

## 2022-01-27 DIAGNOSIS — Z20822 Contact with and (suspected) exposure to covid-19: Secondary | ICD-10-CM | POA: Diagnosis not present

## 2022-01-27 DIAGNOSIS — Z79899 Other long term (current) drug therapy: Secondary | ICD-10-CM | POA: Insufficient documentation

## 2022-01-27 DIAGNOSIS — I10 Essential (primary) hypertension: Secondary | ICD-10-CM

## 2022-01-27 DIAGNOSIS — Z91010 Allergy to peanuts: Secondary | ICD-10-CM | POA: Diagnosis not present

## 2022-01-27 DIAGNOSIS — G459 Transient cerebral ischemic attack, unspecified: Secondary | ICD-10-CM | POA: Diagnosis not present

## 2022-01-27 DIAGNOSIS — Z87891 Personal history of nicotine dependence: Secondary | ICD-10-CM | POA: Insufficient documentation

## 2022-01-27 DIAGNOSIS — J45909 Unspecified asthma, uncomplicated: Secondary | ICD-10-CM | POA: Insufficient documentation

## 2022-01-27 DIAGNOSIS — I639 Cerebral infarction, unspecified: Secondary | ICD-10-CM

## 2022-01-27 DIAGNOSIS — R270 Ataxia, unspecified: Secondary | ICD-10-CM | POA: Diagnosis present

## 2022-01-27 DIAGNOSIS — K219 Gastro-esophageal reflux disease without esophagitis: Secondary | ICD-10-CM | POA: Diagnosis present

## 2022-01-27 HISTORY — DX: Transient cerebral ischemic attack, unspecified: G45.9

## 2022-01-27 LAB — RESP PANEL BY RT-PCR (FLU A&B, COVID) ARPGX2
Influenza A by PCR: NEGATIVE
Influenza B by PCR: NEGATIVE
SARS Coronavirus 2 by RT PCR: NEGATIVE

## 2022-01-27 LAB — I-STAT CHEM 8, ED
BUN: 15 mg/dL (ref 6–20)
Calcium, Ion: 1.13 mmol/L — ABNORMAL LOW (ref 1.15–1.40)
Chloride: 100 mmol/L (ref 98–111)
Creatinine, Ser: 0.7 mg/dL (ref 0.44–1.00)
Glucose, Bld: 122 mg/dL — ABNORMAL HIGH (ref 70–99)
HCT: 37 % (ref 36.0–46.0)
Hemoglobin: 12.6 g/dL (ref 12.0–15.0)
Potassium: 3.6 mmol/L (ref 3.5–5.1)
Sodium: 138 mmol/L (ref 135–145)
TCO2: 28 mmol/L (ref 22–32)

## 2022-01-27 LAB — DIFFERENTIAL
Abs Immature Granulocytes: 0.03 10*3/uL (ref 0.00–0.07)
Basophils Absolute: 0.1 10*3/uL (ref 0.0–0.1)
Basophils Relative: 1 %
Eosinophils Absolute: 0 10*3/uL (ref 0.0–0.5)
Eosinophils Relative: 1 %
Immature Granulocytes: 0 %
Lymphocytes Relative: 32 %
Lymphs Abs: 2.3 10*3/uL (ref 0.7–4.0)
Monocytes Absolute: 0.4 10*3/uL (ref 0.1–1.0)
Monocytes Relative: 5 %
Neutro Abs: 4.6 10*3/uL (ref 1.7–7.7)
Neutrophils Relative %: 61 %

## 2022-01-27 LAB — URINALYSIS, ROUTINE W REFLEX MICROSCOPIC
Bilirubin Urine: NEGATIVE
Glucose, UA: NEGATIVE mg/dL
Ketones, ur: NEGATIVE mg/dL
Leukocytes,Ua: NEGATIVE
Nitrite: NEGATIVE
Protein, ur: NEGATIVE mg/dL
RBC / HPF: >50 RBC/hpf — ABNORMAL HIGH (ref 0–5)
Specific Gravity, Urine: 1.023 (ref 1.005–1.030)
pH: 5 (ref 5.0–8.0)

## 2022-01-27 LAB — CBC
HCT: 35.8 % — ABNORMAL LOW (ref 36.0–46.0)
Hemoglobin: 12.2 g/dL (ref 12.0–15.0)
MCH: 31.4 pg (ref 26.0–34.0)
MCHC: 34.1 g/dL (ref 30.0–36.0)
MCV: 92 fL (ref 80.0–100.0)
Platelets: 348 10*3/uL (ref 150–400)
RBC: 3.89 MIL/uL (ref 3.87–5.11)
RDW: 13.4 % (ref 11.5–15.5)
WBC: 7.4 10*3/uL (ref 4.0–10.5)
nRBC: 0 % (ref 0.0–0.2)

## 2022-01-27 LAB — COMPREHENSIVE METABOLIC PANEL
ALT: 19 U/L (ref 0–44)
AST: 22 U/L (ref 15–41)
Albumin: 4.2 g/dL (ref 3.5–5.0)
Alkaline Phosphatase: 43 U/L (ref 38–126)
Anion gap: 11 (ref 5–15)
BUN: 12 mg/dL (ref 6–20)
CO2: 25 mmol/L (ref 22–32)
Calcium: 9.7 mg/dL (ref 8.9–10.3)
Chloride: 101 mmol/L (ref 98–111)
Creatinine, Ser: 0.75 mg/dL (ref 0.44–1.00)
GFR, Estimated: >60 mL/min (ref >60)
Glucose, Bld: 128 mg/dL — ABNORMAL HIGH (ref 70–99)
Potassium: 3.4 mmol/L — ABNORMAL LOW (ref 3.5–5.1)
Sodium: 137 mmol/L (ref 135–145)
Total Bilirubin: 0.6 mg/dL (ref 0.3–1.2)
Total Protein: 7.2 g/dL (ref 6.5–8.1)

## 2022-01-27 LAB — LIPID PANEL
Cholesterol: 173 mg/dL (ref 0–200)
HDL: 69 mg/dL
LDL Cholesterol: 75 mg/dL (ref 0–99)
Total CHOL/HDL Ratio: 2.5 ratio
Triglycerides: 144 mg/dL
VLDL: 29 mg/dL (ref 0–40)

## 2022-01-27 LAB — RAPID URINE DRUG SCREEN, HOSP PERFORMED
Amphetamines: NOT DETECTED
Barbiturates: NOT DETECTED
Benzodiazepines: NOT DETECTED
Cocaine: NOT DETECTED
Opiates: NOT DETECTED
Tetrahydrocannabinol: NOT DETECTED

## 2022-01-27 LAB — CBG MONITORING, ED: Glucose-Capillary: 125 mg/dL — ABNORMAL HIGH (ref 70–99)

## 2022-01-27 LAB — PROTIME-INR
INR: 1.1 (ref 0.8–1.2)
Prothrombin Time: 13.9 seconds (ref 11.4–15.2)

## 2022-01-27 LAB — ETHANOL: Alcohol, Ethyl (B): <10 mg/dL (ref <10)

## 2022-01-27 LAB — HIV ANTIBODY (ROUTINE TESTING W REFLEX): HIV Screen 4th Generation wRfx: NONREACTIVE

## 2022-01-27 LAB — APTT: aPTT: 27 seconds (ref 24–36)

## 2022-01-27 LAB — I-STAT BETA HCG BLOOD, ED (MC, WL, AP ONLY): I-stat hCG, quantitative: <5 m[IU]/mL (ref <5)

## 2022-01-27 MED ORDER — ALBUTEROL SULFATE (2.5 MG/3ML) 0.083% IN NEBU: INHALATION_SOLUTION | Freq: Four times a day (QID) | RESPIRATORY_TRACT | Status: DC | PRN

## 2022-01-27 MED ORDER — ACETAMINOPHEN 160 MG/5ML PO SOLN: 650.0000 mg | ORAL | Status: DC | PRN

## 2022-01-27 MED ORDER — PANTOPRAZOLE SODIUM 40 MG PO TBEC
40.0000 mg | DELAYED_RELEASE_TABLET | Freq: Every day | ORAL | Status: DC
  Administered 2022-01-27 – 2022-01-28 (×2): 40 mg via ORAL
  Filled 2022-01-27 (×2): qty 1

## 2022-01-27 MED ORDER — ROSUVASTATIN CALCIUM 20 MG PO TABS
20.0000 mg | ORAL_TABLET | Freq: Every day | ORAL | Status: DC
  Administered 2022-01-27 – 2022-01-28 (×2): 20 mg via ORAL
  Filled 2022-01-27 (×2): qty 1

## 2022-01-27 MED ORDER — ACETAMINOPHEN 325 MG PO TABS: 650.0000 mg | ORAL_TABLET | ORAL | Status: DC | PRN

## 2022-01-27 MED ORDER — IOHEXOL 350 MG/ML SOLN
50.0000 mL | Freq: Once | INTRAVENOUS | Status: AC | PRN
  Administered 2022-01-27: 50 mL via INTRAVENOUS

## 2022-01-27 MED ORDER — STROKE: EARLY STAGES OF RECOVERY BOOK
Freq: Once | Status: AC
  Filled 2022-01-27: qty 1

## 2022-01-27 MED ORDER — CLOPIDOGREL BISULFATE 75 MG PO TABS
75.0000 mg | ORAL_TABLET | Freq: Every day | ORAL | Status: DC
  Administered 2022-01-28: 75 mg via ORAL
  Filled 2022-01-27: qty 1

## 2022-01-27 MED ORDER — ACETAMINOPHEN 650 MG RE SUPP: 650.0000 mg | RECTAL | Status: DC | PRN

## 2022-01-27 MED ORDER — MOMETASONE FURO-FORMOTEROL FUM 100-5 MCG/ACT IN AERO
2.0000 | INHALATION_SPRAY | Freq: Two times a day (BID) | RESPIRATORY_TRACT | Status: DC
  Administered 2022-01-28: 2 via RESPIRATORY_TRACT
  Filled 2022-01-27: qty 8.8

## 2022-01-27 MED ORDER — ASPIRIN 81 MG PO TBEC
81.0000 mg | DELAYED_RELEASE_TABLET | Freq: Every day | ORAL | Status: DC
  Administered 2022-01-28: 81 mg via ORAL
  Filled 2022-01-27: qty 1

## 2022-01-27 MED ORDER — ENOXAPARIN SODIUM 40 MG/0.4ML IJ SOSY
40.0000 mg | PREFILLED_SYRINGE | Freq: Every day | INTRAMUSCULAR | Status: DC
  Administered 2022-01-27 – 2022-01-28 (×2): 40 mg via SUBCUTANEOUS
  Filled 2022-01-27 (×2): qty 0.4

## 2022-01-27 NOTE — ED Triage Notes (Signed)
Pt from home, woke up normal this morning and was eating breakfast and felt tingling in her mouth like she was having an allergic reaction and called EMS. That feeling has subsided, but on EMS arrival she was uncoordinated and had abnormal speech so code stroke was activated. LSN 0930 today, per the patient.

## 2022-01-27 NOTE — ED Notes (Signed)
Patient transported to MRI 

## 2022-01-27 NOTE — ED Notes (Signed)
ED TO INPATIENT HANDOFF REPORT  ED Nurse Name and Phone #: Kamill Fulbright RN [phone]  S Name/Age/Gender Shari Gibson 56 y.o. female Room/Bed: 038C/038C  Code Status   Code Status: Full Code  Home/SNF/Other Home Patient oriented to: self, place, time, and situation Is this baseline? Yes   Triage Complete: Triage complete  Chief Complaint CVA (cerebral vascular accident) [facility]) [I63.9]  Triage Note Pt from home, woke up normal this morning and was eating breakfast and felt tingling in her mouth like she was having an allergic reaction and called EMS. That feeling has subsided, but on EMS arrival she was uncoordinated and had abnormal speech so code stroke was activated. LSN 0930 today, per the patient.    Allergies No Known Allergies  Level of Care/Admitting Diagnosis ED Disposition     ED Disposition  Admit   Condition  --   Comment  Hospital Area: [place] [[id number]]  Level of Care: Med-Surg [16]  May place patient in observation at [facility] or [facility] if equivalent level of care is available:: No  Covid Evaluation: Asymptomatic - no recent exposure (last 10 days) testing not required  Diagnosis: CVA (cerebral vascular accident) [facility]) [[id number]]  Admitting Physician: Velna Ochs [[id number]]  Attending Physician: Velna Ochs [[id number]]          B Medical/Surgery History Past Medical History:  Diagnosis Date   Anemia    Anemia 01/14/2017   Asthma    Fibroids    GERD (gastroesophageal reflux disease)    Past Surgical History:  Procedure Laterality Date   TUBAL LIGATION       A IV Location/Drains/Wounds Patient Lines/Drains/Airways Status     Active Line/Drains/Airways     Name Placement date Placement time Site Days   Peripheral IV 01/27/22 20 G Left Antecubital 01/27/22  1125  Antecubital  less than 1            Intake/Output Last 24 hours No intake or output data in the 24 hours ending 01/27/22  2041  Labs/Imaging Results for orders placed or performed during the hospital encounter of 01/27/22 (from the past 48 hour(s))  CBG monitoring, ED     Status: Abnormal   Collection Time: 01/27/22 10:55 AM  Result Value Ref Range   Glucose-Capillary 125 (H) 70 - 99 mg/dL    Comment: Glucose reference range applies only to samples taken after fasting for at least 8 hours.  Ethanol     Status: None   Collection Time: 01/27/22 11:00 AM  Result Value Ref Range   Alcohol, Ethyl (B) <10 <10 mg/dL    Comment: (NOTE) Lowest detectable limit for serum alcohol is 10 mg/dL.  For medical purposes only. Performed at [facility], [place] [address] [place], [place] [address]   Protime-INR     Status: None   Collection Time: 01/27/22 11:00 AM  Result Value Ref Range   Prothrombin Time 13.9 11.4 - 15.2 seconds   INR 1.1 0.8 - 1.2    Comment: (NOTE) INR goal varies based on device and disease states. Performed at [facility], [place] [address], [place], [place] [address]   APTT     Status: None   Collection Time: 01/27/22 11:00 AM  Result Value Ref Range   aPTT 27 24 - 36 seconds    Comment: Performed at [facility] [address], [place], [place] [address]  CBC     Status: Abnormal   Collection Time:  01/27/22 11:00 AM  Result Value Ref Range   WBC 7.4 4.0 - 10.5 K/uL   RBC 3.89 3.87 - 5.11 MIL/uL   Hemoglobin 12.2 12.0 - 15.0 g/dL   HCT 35.8 (L) 36.0 - 46.0 %   MCV 92.0 80.0 - 100.0 fL   MCH 31.4 26.0 - 34.0 pg   MCHC 34.1 30.0 - 36.0 g/dL   RDW 13.4 11.5 - 15.5 %   Platelets 348 150 - 400 K/uL   nRBC 0.0 0.0 - 0.2 %    Comment: Performed at [facility], [place] [address], [place], [place] [address]  Differential     Status: None   Collection Time: 01/27/22 11:00 AM  Result Value Ref Range   Neutrophils Relative % 61 %   Neutro Abs 4.6 1.7 - 7.7 K/uL   Lymphocytes Relative 32 %   Lymphs Abs 2.3 0.7 - 4.0 K/uL   Monocytes Relative 5 %   Monocytes Absolute  0.4 0.1 - 1.0 K/uL   Eosinophils Relative 1 %   Eosinophils Absolute 0.0 0.0 - 0.5 K/uL   Basophils Relative 1 %   Basophils Absolute 0.1 0.0 - 0.1 K/uL   Immature Granulocytes 0 %   Abs Immature Granulocytes 0.03 0.00 - 0.07 K/uL    Comment: Performed at [facility] [address] [place], [place] [address]  Comprehensive metabolic panel     Status: Abnormal   Collection Time: 01/27/22 11:00 AM  Result Value Ref Range   Sodium 137 135 - 145 mmol/L   Potassium 3.4 (L) 3.5 - 5.1 mmol/L   Chloride 101 98 - 111 mmol/L   CO2 25 22 - 32 mmol/L   Glucose, Bld 128 (H) 70 - 99 mg/dL    Comment: Glucose reference range applies only to samples taken after fasting for at least 8 hours.   BUN 12 6 - 20 mg/dL   Creatinine, Ser 0.75 0.44 - 1.00 mg/dL   Calcium 9.7 8.9 - 10.3 mg/dL   Total Protein 7.2 6.5 - 8.1 g/dL   Albumin 4.2 3.5 - 5.0 g/dL   AST 22 15 - 41 U/L   ALT 19 0 - 44 U/L   Alkaline Phosphatase 43 38 - 126 U/L   Total Bilirubin 0.6 0.3 - 1.2 mg/dL   GFR, Estimated >60 >60 mL/min    Comment: (NOTE) Calculated using the CKD-EPI Creatinine Equation (2021)    Anion gap 11 5 - 15    Comment: Performed at [facility] [address], [place], [place] [address]  I-Stat beta hCG blood, ED     Status: None   Collection Time: 01/27/22 11:01 AM  Result Value Ref Range   I-stat hCG, quantitative <5.0 <5 mIU/mL   Comment 3            Comment:   GEST. AGE      CONC.  (mIU/mL)   <=1 WEEK        5 - 50     2 WEEKS       50 - 500     3 WEEKS       100 - 10,000     4 WEEKS     1,000 - 30,000        FEMALE AND NON-PREGNANT FEMALE:     LESS THAN 5 mIU/mL   I-stat chem 8, ED     Status: Abnormal   Collection Time: 01/27/22 11:02 AM  Result Value Ref  Range   Sodium 138 135 - 145 mmol/L   Potassium 3.6 3.5 - 5.1 mmol/L   Chloride 100 98 - 111 mmol/L   BUN 15 6 - 20 mg/dL   Creatinine, Ser 0.70 0.44 - 1.00 mg/dL   Glucose, Bld 122 (H) 70 - 99 mg/dL    Comment: Glucose  reference range applies only to samples taken after fasting for at least 8 hours.   Calcium, Ion 1.13 (L) 1.15 - 1.40 mmol/L   TCO2 28 22 - 32 mmol/L   Hemoglobin 12.6 12.0 - 15.0 g/dL   HCT 37.0 36.0 - 46.0 %  Resp Panel by RT-PCR (Flu A&B, Covid) Anterior Nasal Swab     Status: None   Collection Time: 01/27/22 11:57 AM   Specimen: Anterior Nasal Swab  Result Value Ref Range   SARS Coronavirus 2 by RT PCR NEGATIVE NEGATIVE    Comment: (NOTE) SARS-CoV-2 target nucleic acids are NOT DETECTED.  The SARS-CoV-2 RNA is generally detectable in upper respiratory specimens during the acute phase of infection. The lowest concentration of SARS-CoV-2 viral copies this assay can detect is 138 copies/mL. A negative result does not preclude SARS-Cov-2 infection and should not be used as the sole basis for treatment or other patient management decisions. A negative result may occur with  improper specimen collection/handling, submission of specimen other than nasopharyngeal swab, presence of viral mutation(s) within the areas targeted by this assay, and inadequate number of viral copies(<138 copies/mL). A negative result must be combined with clinical observations, patient history, and epidemiological information. The expected result is Negative.  Fact Sheet for Patients:  EntrepreneurPulse.com.au  Fact Sheet for Healthcare Providers:  IncredibleEmployment.be  This test is no t yet approved or cleared by the [place] FDA and  has been authorized for detection and/or diagnosis of SARS-CoV-2 by FDA under an Emergency Use Authorization (EUA). This EUA will remain  in effect (meaning this test can be used) for the duration of the COVID-19 declaration under Section 564(b)(1) of the Act, 21 U.S.C.section 360bbb-3(b)(1), unless the authorization is terminated  or revoked sooner.       Influenza A by PCR NEGATIVE NEGATIVE   Influenza B by PCR NEGATIVE  NEGATIVE    Comment: (NOTE) The Xpert Xpress SARS-CoV-2/FLU/RSV plus assay is intended as an aid in the diagnosis of influenza from Nasopharyngeal swab specimens and should not be used as a sole basis for treatment. Nasal washings and aspirates are unacceptable for Xpert Xpress SARS-CoV-2/FLU/RSV testing.  Fact Sheet for Patients: EntrepreneurPulse.com.au  Fact Sheet for Healthcare Providers: IncredibleEmployment.be  This test is not yet approved or cleared by the [place] FDA and has been authorized for detection and/or diagnosis of SARS-CoV-2 by FDA under an Emergency Use Authorization (EUA). This EUA will remain in effect (meaning this test can be used) for the duration of the COVID-19 declaration under Section 564(b)(1) of the Act, 21 U.S.C. section 360bbb-3(b)(1), unless the authorization is terminated or revoked.  Performed at [facility], [place] [address], [place], [place] [address]   Urine rapid drug screen (hosp performed)     Status: None   Collection Time: 01/27/22 11:59 AM  Result Value Ref Range   Opiates NONE DETECTED NONE DETECTED   Cocaine NONE DETECTED NONE DETECTED   Benzodiazepines NONE DETECTED NONE DETECTED   Amphetamines NONE DETECTED NONE DETECTED   Tetrahydrocannabinol NONE DETECTED NONE DETECTED   Barbiturates NONE DETECTED NONE DETECTED    Comment: (NOTE) DRUG SCREEN FOR MEDICAL PURPOSES  ONLY.  IF CONFIRMATION IS NEEDED FOR ANY PURPOSE, NOTIFY LAB WITHIN 5 DAYS.  LOWEST DETECTABLE LIMITS FOR URINE DRUG SCREEN Drug Class                     Cutoff (ng/mL) Amphetamine and metabolites    1000 Barbiturate and metabolites    200 Benzodiazepine                 809 Tricyclics and metabolites     300 Opiates and metabolites        300 Cocaine and metabolites        300 THC                            50 Performed at [facility], [place] [address], [place], [place] [address]   Urinalysis, Routine w  reflex microscopic Urine, Clean Catch     Status: Abnormal   Collection Time: 01/27/22 11:59 AM  Result Value Ref Range   Color, Urine YELLOW YELLOW   APPearance HAZY (A) CLEAR   Specific Gravity, Urine 1.023 1.005 - 1.030   pH 5.0 5.0 - 8.0   Glucose, UA NEGATIVE NEGATIVE mg/dL   Hgb urine dipstick LARGE (A) NEGATIVE   Bilirubin Urine NEGATIVE NEGATIVE   Ketones, ur NEGATIVE NEGATIVE mg/dL   Protein, ur NEGATIVE NEGATIVE mg/dL   Nitrite NEGATIVE NEGATIVE   Leukocytes,Ua NEGATIVE NEGATIVE   RBC / HPF >50 (H) 0 - 5 RBC/hpf   WBC, UA 0-5 0 - 5 WBC/hpf   Bacteria, UA RARE (A) NONE SEEN   Squamous Epithelial / LPF 0-5 0 - 5   Mucus PRESENT     Comment: Performed at [facility], [place] [address] [place], [place] [address]  HIV Antibody (routine testing w rflx)     Status: None   Collection Time: 01/27/22  3:00 PM  Result Value Ref Range   HIV Screen 4th Generation wRfx Non Reactive Non Reactive    Comment: Performed at [facility], [place] [address], [place], [place] [address]   MR Brain Wo Contrast (neuro protocol)  Result Date: 01/27/2022 CLINICAL DATA:  Mouth numbness, speech problem ataxia EXAM: MRI HEAD WITHOUT CONTRAST TECHNIQUE: Multiplanar, multiecho pulse sequences of the brain and surrounding structures were obtained without intravenous contrast. COMPARISON:  Same-day CT/CTA head and neck FINDINGS: Brain: There is no acute intracranial hemorrhage, extra-axial fluid collection, or acute infarct Parenchymal volume is normal. The ventricles are normal in size. Gray-white differentiation is preserved. Parenchymal signal is normal. There is no mass lesion.  There is no mass effect or midline shift. Vascular: Normal flow voids. Skull and upper cervical spine: Normal marrow signal. Sinuses/Orbits: The paranasal sinuses are clear. The globes and orbits are unremarkable. Other: None. IMPRESSION: Normal brain MRI. Electronically Signed   By: Valetta Mole M.D.   On: 01/27/2022 15:19    CT ANGIO HEAD NECK W WO CM (CODE STROKE)  Result Date: 01/27/2022 CLINICAL DATA:  Neuro deficit, acute, stroke suspected EXAM: CT ANGIOGRAPHY HEAD AND NECK TECHNIQUE: Multidetector CT imaging of the head and neck was performed using the standard protocol during bolus administration of intravenous contrast. Multiplanar CT image reconstructions and MIPs were obtained to evaluate the vascular anatomy. Carotid stenosis measurements (when applicable) are obtained utilizing NASCET criteria, using the distal internal carotid diameter as the denominator. RADIATION DOSE REDUCTION: This exam was performed according to the departmental dose-optimization program  which includes automated exposure control, adjustment of the mA and/or kV according to patient size and/or use of iterative reconstruction technique. CONTRAST:  65m OMNIPAQUE IOHEXOL 350 MG/ML SOLN COMPARISON:  None Available. FINDINGS: CTA NECK Aortic arch: Unremarkable. Great vessel origins are patent. There is direct origin of the left vertebral from the arch. Right carotid system: Patent. Partially retropharyngeal course. Eccentric noncalcified plaque along the ICA origin with less than 50% stenosis. Left carotid system: Patent. Partially retropharyngeal course. No stenosis. Vertebral arteries: Patent and codominant.  No stenosis. Skeleton: Overall minor cervical spine degenerative changes. There is marked left facet arthropathy at C2-C3. Other neck: Unremarkable. Upper chest: Included lung apices are clear. Review of the MIP images confirms the above findings CTA HEAD Anterior circulation: Intracranial internal carotid arteries are patent. Anterior and middle cerebral arteries are patent. Posterior circulation: Intracranial vertebral arteries are patent. Basilar artery is patent. Major cerebellar artery origins are patent. Bilateral posterior communicating arteries are present. Posterior cerebral arteries are patent. Venous sinuses: Patent as allowed by  contrast bolus timing. Review of the MIP images confirms the above findings IMPRESSION: No large vessel occlusion, hemodynamically significant stenosis, or evidence of dissection. Electronically Signed   By: PMacy MisM.D.   On: 01/27/2022 11:18   CT HEAD CODE STROKE WO CONTRAST  Result Date: 01/27/2022 CLINICAL DATA:  Code stroke.  Neuro deficit, acute, stroke suspected EXAM: CT HEAD WITHOUT CONTRAST TECHNIQUE: Contiguous axial images were obtained from the base of the skull through the vertex without intravenous contrast. RADIATION DOSE REDUCTION: This exam was performed according to the departmental dose-optimization program which includes automated exposure control, adjustment of the mA and/or kV according to patient size and/or use of iterative reconstruction technique. COMPARISON:  None Available. FINDINGS: Brain: No acute intracranial hemorrhage, mass effect, or edema. Ventricles and sulci are normal in size and configuration. Gray-white differentiation is preserved. Vascular: No hyperdense vessel. Skull: Unremarkable. Sinuses/Orbits: No acute abnormality. Other: Mastoid air cells are clear. ASPECTS (ASouth SolonStroke Program Early CT Score) - Ganglionic level infarction (caudate, lentiform nuclei, internal capsule, insula, M1-M3 cortex): 7 - Supraganglionic infarction (M4-M6 cortex): 3 Total score (0-10 with 10 being normal): 10 IMPRESSION: There is no acute intracranial hemorrhage or evidence of acute infarction. ASPECT score is 10. These results were communicated to Dr. CTheda Sersat 11:04 am on 01/27/2022 by text page via the [facility] system. Electronically Signed   By: PMacy MisM.D.   On: 01/27/2022 11:09    Pending Labs Unresulted Labs (From admission, onward)     Start     Ordered   02/03/22 0500  Creatinine, serum  (enoxaparin (LOVENOX)    CrCl >/= 30 ml/min)  Weekly,   R     Comments: while on enoxaparin therapy    01/27/22 1254   01/27/22 1535  Lipid panel  Add-on,   AD         01/27/22 1534   01/27/22 1252  Hemoglobin A1c  (Labs)  Once,   R       Comments: To assess prior glycemic control    01/27/22 1254            Vitals/Pain Today's Vitals   01/27/22 1500 01/27/22 1625 01/27/22 1900 01/27/22 1930  BP: 127/79 117/73 117/62 121/68  Pulse: 84 88 81 81  Resp:  16  16  Temp:      TempSrc:      SpO2: 100% 94% 100% 97%  Weight:      Height:  PainSc:        Isolation Precautions No active isolations  Medications Medications  acetaminophen (TYLENOL) tablet 650 mg (has no administration in time range)    Or  acetaminophen (TYLENOL) 160 MG/5ML solution 650 mg (has no administration in time range)    Or  acetaminophen (TYLENOL) suppository 650 mg (has no administration in time range)  enoxaparin (LOVENOX) injection 40 mg (40 mg Subcutaneous Given 01/27/22 1521)  albuterol (PROVENTIL) (2.5 MG/3ML) 0.083% nebulizer solution (has no administration in time range)  mometasone-formoterol (DULERA) 100-5 MCG/ACT inhaler 2 puff (has no administration in time range)  pantoprazole (PROTONIX) EC tablet 40 mg (40 mg Oral Given 01/27/22 1646)  rosuvastatin (CRESTOR) tablet 20 mg (20 mg Oral Given 01/27/22 1646)  iohexol (OMNIPAQUE) 350 MG/ML injection 50 mL (50 mLs Intravenous Contrast Given 01/27/22 1110)   stroke: early stages of recovery book ( Does not apply Given 01/27/22 1521)    Mobility walks Low fall risk   Focused Assessments Neuro Assessment Handoff:  Swallow screen pass? Yes    NIH Stroke Scale ( + Modified Stroke Scale Criteria)  Interval: Initial Level of Consciousness (1a.)   : Alert, keenly responsive LOC Questions (1b. )   +: Answers both questions correctly LOC Commands (1c. )   + : Performs both tasks correctly Best Gaze (2. )  +: Normal Visual (3. )  +: No visual loss Facial Palsy (4. )    : Normal symmetrical movements Motor Arm, Left (5a. )   +: No drift Motor Arm, Right (5b. )   +: No drift Motor Leg, Left (6a. )   +: No  drift Motor Leg, Right (6b. )   +: No drift Limb Ataxia (7. ): Present in two limbs Sensory (8. )   +: Normal, no sensory loss Best Language (9. )   +: No aphasia Dysarthria (10. ): Mild-to-moderate dysarthria, patient slurs at least some words and, at worst, can be understood with some difficulty Extinction/Inattention (11.)   +: No Abnormality Modified SS Total  +: 0 Complete NIHSS TOTAL: 3 Last date known well: 05/16/22 Last time known well: 0930 Neuro Assessment: Exceptions to WDL Neuro Checks:   Initial (01/27/22 1100)  Last Documented NIHSS Modified Score: 0 (01/27/22 1330) Has TPA been given? No If patient is a Neuro Trauma and patient is going to OR before floor call report to [facility] nurse: [phone] or [phone]   R Recommendations: See Admitting Provider Note  Report given to: [id number],EXHBZ RN

## 2022-01-27 NOTE — H&P (Signed)
NAME:  Shari Gibson, MRN:  [id number], DOB:  1966/02/01, LOS: 0 ADMISSION DATE:  01/27/2022, Primary: Patient, No Pcp Per (Inactive)  CHIEF COMPLAINT: Dysarthria, ataxia  Medical Service: Internal Medicine Teaching Service         Attending Physician: Dr. Velna Ochs, MD    First Contact: Dr. Jeanice Lim Pager: [phone]  Second Contact: Dr. Alfonse Spruce Pager: [phone]       After Hours (After 5p/  First Contact Pager: [phone]  weekends / holidays): Second Contact Pager: [phone]    History of present illness   Shari Gibson is a 56 year old female with hypertension, obesity, and history of tobacco and alcohol use who presented to the ED as a code stroke this morning after sudden onset dysarthria, globus sensation, perioral numbness, and ataxia.  Patient is a sole historian.  She was having breakfast at work this morning when she developed sudden onset perioral numbness and odd sensation in the back of her throat.  She developed dysarthria shortly thereafter and EMS was called.  They noted ataxia as well.  Last known normal was 09 30 this morning.  She was transferred to the ED as a code stroke.  Initial head CT and CTA unrevealing.  Medicine asked to admit.  Denies prior similar symptoms.  Denies recent palpitations.  She denies family history of early strokes.  She was drinking alcohol daily (up to a half a pint per day) up until about a week ago at which time she abruptly stopped drinking and quit eating meat and dairy products.  She has not smoked since 1996.  She denies illicit substance use.  Past Medical History  She,  has a past medical history of Anemia, Anemia (01/14/2017), Asthma, Fibroids, and GERD (gastroesophageal reflux disease).   Home Medications     Albuterol 108 mcg per ACT inhaler-2 puffs every 6 hours as needed Symbicort 2 puffs daily Nexium 20 mg daily Valsartan-hydrochlorothiazide 160-25 mg daily  Allergies    Allergies as of 01/27/2022   (No Known  Allergies)    Social History  Works at [employer].  Quit drinking alcohol about a week ago, drinking up to half a pint per day prior to that.  She has not smoked since 1996.  Denies illicit substance use.  Family History   Her family history includes Alzheimer's disease in her maternal grandmother; Diabetes in her maternal aunt and mother; Hypertension in her maternal aunt and mother. She was adopted.   Objective   Blood pressure 127/79, pulse 84, temperature 97.7 F (36.5 C), temperature source Temporal, resp. rate 12, height '5\' 7"'$  (1.702 m), weight 96.8 kg, SpO2 100 %.    General: Well-appearing female resting comfortably on the stretcher HEENT: Moist mucous membranes, no oropharyngeal swelling Cardiac: Heart regular rate and rhythm, no lower extremity edema Pulm: Breathing comfortably on room air, lung sounds are clear GI: Abdomen soft, nondistended, nontender Skin: No rash or lesion on limited exam  Neurologic exam Mental status: Alert and oriented x4 Speech: No dysarthria, reception intact Cranial nerves: Pupils equal, round, reactive to light.  EOMs intact.  Peripheral vision intact.  Face symmetric.  Facial sensation is intact.  Tongue and uvula are midline. Motor: 5 out of 5 strength in bilateral upper and lower extremities.  Normal muscle bulk and tone. Sensation: Intact in the bilateral upper and lower extremities equally Cerebellar: No pronator drift.  Rapid alternating movements intact.  Normal heel-to-shin testing Gait: Deferred Significant Diagnostic Tests:      Latest Ref  Rng & Units 01/27/2022   11:02 AM 01/27/2022   11:00 AM 06/09/2018   10:20 AM  CBC  WBC 4.0 - 10.5 K/uL  7.4  8.6   Hemoglobin 12.0 - 15.0 g/dL 12.6  12.2  10.0   Hematocrit 36.0 - 46.0 % 37.0  35.8  33.9   Platelets 150 - 400 K/uL  348  514       Latest Ref Rng & Units 01/27/2022   11:02 AM 01/27/2022   11:00 AM 06/09/2018   10:20 AM  BMP  Glucose 70 - 99 mg/dL 122  128  84   BUN 6 - 20 mg/dL  '15  12  7   '$ Creatinine 0.44 - 1.00 mg/dL 0.70  0.75  0.62   BUN/Creat Ratio 9 - 23   11   Sodium 135 - 145 mmol/L 138  137  139   Potassium 3.5 - 5.1 mmol/L 3.6  3.4  4.5   Chloride 98 - 111 mmol/L 100  101  100   CO2 22 - 32 mmol/L  25  24   Calcium 8.9 - 10.3 mg/dL  9.7  9.5     Summary  56 year old female with hypertension, obesity, and history of tobacco and alcohol use presenting with abnormal sensation in her throat, dysarthria, perioral numbness, and ataxia that began at approximately 09 30 this morning.  Requires admission for stroke work-up  Assessment & Plan:   TIA - Last known normal 09 30 this morning.  Presenting NIH 3.  No tPA given due to low NIH. - Risk factors include hypertension, obesity, and history of tobacco and alcohol use - Initial head CT/CTA unrevealing.  Aspects 10.  No LVO.  No ischemic changes on MRI - Symptoms have resolved since arrival in the ED Plan - Echocardiogram - Check A1c and lipids - Continuous telemetry monitoring - Frequent neurochecks - Aspirin 325 mg daily - Crestor 20 mg daily - PT/OT consult - Passed bedside swallow screen.  Regular diet - Can likely discharge tomorrow once echocardiogram is completed  Hypertension - Hold antihypertensives.  Allow for 24 hours of permissive hypertension.  Obesity (BMI 33.42) - Counseled on nutrition and importance of weight loss  History of heavy alcohol use - Reports recent history of significant alcohol use up to a half a pint a day.  She quit drinking about 7 days ago.  She denies any withdrawal symptoms.  Continue to encourage alcohol abstinence  Asthma - Chronic and stable.  Continue Symbicort and albuterol  GERD - Protonix Best practice:  CODE STATUS: Full DVT for prophylaxis: Lovenox Dispo: Admit patient to Observation with expected length of stay less than 2 midnights.   Shari Hansen, MD Internal Medicine Resident PGY-3 [facility] Residency Pager:  [phone] 01/27/2022 3:20 PM

## 2022-01-27 NOTE — Consult Note (Addendum)
Neurology Consultation  Reason for Consult: Code Stroke Referring Physician: Rogene Houston  CC: Slurred speech and ataxia  History is obtained from: Patient, EMS  HPI: Shari Gibson is a 56 y.o. female with a PMH of anemia, asthma, fibroids, and HTN presenting with dysarthria and ataxia. She was eating grits, an orange, and peanuts for breakfast when her mouth started to go numb and tingle. She called EMS thinking she was having an allergic reaction and they noticed the limb ataxia and dysarthria. She denies headache, dizziness, numbness, or tingling. She has not been sick recently, denies nausea, vomiting, or diarrhea.   LKW: 0930 TNK given?: no, low NIH IR Thrombectomy? No, No LVO Modified Rankin Scale: 0-Completely asymptomatic and back to baseline post- stroke  ROS: A complete ROS was performed and is negative except as noted in the HPI.   Past Medical History:  Diagnosis Date   Anemia    Anemia 01/14/2017   Asthma    Fibroids    GERD (gastroesophageal reflux disease)     Family History  Adopted: Yes  Problem Relation Age of Onset   Diabetes Maternal Aunt    Hypertension Maternal Aunt    Diabetes Mother    Hypertension Mother    Alzheimer's disease Maternal Grandmother     Social History:   reports that she quit smoking about 6 years ago. Her smoking use included cigarettes. She has never used smokeless tobacco. She reports that she does not currently use alcohol. She reports that she does not use drugs.  Medications  Current Facility-Administered Medications:     stroke: early stages of recovery book, , Does not apply, Once, Christian, Rylee, MD   acetaminophen (TYLENOL) tablet 650 mg, 650 mg, Oral, Q4H PRN **OR** acetaminophen (TYLENOL) 160 MG/5ML solution 650 mg, 650 mg, Per Tube, Q4H PRN **OR** acetaminophen (TYLENOL) suppository 650 mg, 650 mg, Rectal, Q4H PRN, Christian, Rylee, MD   enoxaparin (LOVENOX) injection 40 mg, 40 mg, Subcutaneous, Q24H, Christian,  Rylee, MD  Current Outpatient Medications:    acetaminophen (TYLENOL) 500 MG tablet, Take 500-1,000 mg by mouth every 6 (six) hours as needed for mild pain (muscle aches)., Disp: , Rfl:    albuterol (PROVENTIL HFA;VENTOLIN HFA) 108 (90 Base) MCG/ACT inhaler, Inhale 2 puffs into the lungs every 6 (six) hours as needed for wheezing or shortness of breath., Disp: 1 Inhaler, Rfl: 1   budesonide-formoterol (SYMBICORT) 80-4.5 MCG/ACT inhaler, Inhale 2 puffs into the lungs in the morning and at bedtime., Disp: , Rfl:    esomeprazole (NEXIUM) 20 MG capsule, Take 20 mg by mouth daily at 12 noon., Disp: , Rfl:    folic acid (FOLVITE) 1 MG tablet, Take 1 tablet (1 mg total) by mouth daily., Disp: 30 tablet, Rfl: 0   iron polysaccharides (NIFEREX) 150 MG capsule, Take 1 capsule (150 mg total) by mouth daily., Disp: 30 capsule, Rfl: 0   Multiple Vitamin (MULTIVITAMIN WITH MINERALS) TABS tablet, Take 1 tablet by mouth daily., Disp: 30 tablet, Rfl: 0   thiamine 100 MG tablet, Take 1 tablet (100 mg total) by mouth daily., Disp: 30 tablet, Rfl: 0   valsartan-hydrochlorothiazide (DIOVAN-HCT) 160-25 MG tablet, Take 1 tablet by mouth daily., Disp: , Rfl:    Exam: Current vital signs: BP (!) 148/78   Pulse 86   Temp 97.7 F (36.5 C) (Temporal)   Resp 12   Ht '5\' 7"'$  (1.702 m)   Wt 96.8 kg   SpO2 100%   BMI 33.42 kg/m  Vital signs  in last 24 hours: Temp:  [97.7 F (36.5 C)] 97.7 F (36.5 C) (06/13 1124) Pulse Rate:  [86-99] 86 (06/13 1245) Resp:  [12-22] 12 (06/13 1245) BP: (136-165)/(75-94) 148/78 (06/13 1245) SpO2:  [100 %] 100 % (06/13 1245) Weight:  [96.8 kg] 96.8 kg (06/13 1138)  GENERAL: Awake, alert, in no acute distress Psych: Affect appropriate for situation, patient is calm and cooperative with examination Head: Normocephalic and atraumatic, without obvious abnormality EENT: Normal conjunctivae, dry mucous membranes, no OP obstruction LUNGS: Normal respiratory effort. Non-labored breathing  on room air CV: Regular rate and rhythm on telemetry ABDOMEN: Soft, non-tender, non-distended Extremities: warm, well perfused, without obvious deformity  NEURO:  Mental Status: Awake, alert, and oriented to person, place, time, and situation. He/She is able to provide a clear and coherent history of present illness. Speech/Language: speech is dysarthric and slowed.   Naming, repetition, fluency, and comprehension intact without aphasia  No neglect is noted Cranial Nerves:  II: PERRL 3 mm/brisk. visual fields full.  III, IV, VI: EOMI. Lid elevation symmetric and full.  V: Sensation is intact to light touch and symmetrical to face. Blinks to threat. Moves jaw back and forth.  VII: Face is symmetric resting and smiling. Able to puff cheeks and raise eyebrows.  VIII: Hearing intact to voice IX, X: Palate elevation is symmetric. Phonation normal.  XI: Normal sternocleidomastoid and trapezius muscle strength XII: Tongue protrudes midline without fasciculations.   Motor: 5/5 strength is all muscle groups.  Tone is normal. Bulk is normal.  Sensation: Intact to light touch bilaterally in all four extremities. No extinction to DSS present.  Coordination: Ataxia noted with FTN and HKS in all extremities DTRs: 2+ throughout.  Gait: Deferred  NIHSS: 1a Level of Conscious.: 0 1b LOC Questions: 0 1c LOC Commands: 0 2 Best Gaze: 0 3 Visual: 0 4 Facial Palsy: 0 5a Motor Arm - left: 0 5b Motor Arm - Right: 0 6a Motor Leg - Left: 0 6b Motor Leg - Right: 0 7 Limb Ataxia: 2 8 Sensory: 0 9 Best Language: 0 10 Dysarthria: 1 11 Extinct. and Inatten.: 0 TOTAL: 3   Labs I have reviewed labs in epic and the results pertinent to this consultation are:   CBC    Component Value Date/Time   WBC 7.4 01/27/2022 1100   RBC 3.89 01/27/2022 1100   HGB 12.6 01/27/2022 1102   HGB 10.0 (L) 06/09/2018 1020   HCT 37.0 01/27/2022 1102   HCT 33.9 (L) 06/09/2018 1020   PLT 348 01/27/2022 1100   PLT  514 (H) 06/09/2018 1020   MCV 92.0 01/27/2022 1100   MCV 74 (L) 06/09/2018 1020   MCH 31.4 01/27/2022 1100   MCHC 34.1 01/27/2022 1100   RDW 13.4 01/27/2022 1100   RDW 22.3 (H) 06/09/2018 1020   LYMPHSABS 2.3 01/27/2022 1100   LYMPHSABS 2.8 06/09/2018 1020   MONOABS 0.4 01/27/2022 1100   EOSABS 0.0 01/27/2022 1100   EOSABS 0.1 06/09/2018 1020   BASOSABS 0.1 01/27/2022 1100   BASOSABS 0.1 06/09/2018 1020    CMP     Component Value Date/Time   NA 138 01/27/2022 1102   NA 139 06/09/2018 1020   K 3.6 01/27/2022 1102   CL 100 01/27/2022 1102   CO2 25 01/27/2022 1100   GLUCOSE 122 (H) 01/27/2022 1102   BUN 15 01/27/2022 1102   BUN 7 06/09/2018 1020   CREATININE 0.70 01/27/2022 1102   CREATININE 0.71 01/14/2017 0845   CALCIUM 9.7  01/27/2022 1100   PROT 7.2 01/27/2022 1100   PROT 7.4 06/09/2018 1020   ALBUMIN 4.2 01/27/2022 1100   ALBUMIN 4.6 06/09/2018 1020   AST 22 01/27/2022 1100   ALT 19 01/27/2022 1100   ALKPHOS 43 01/27/2022 1100   BILITOT 0.6 01/27/2022 1100   BILITOT <0.2 06/09/2018 1020   GFRNONAA >60 01/27/2022 1100   GFRAA 121 06/09/2018 1020    Lipid Panel     Component Value Date/Time   CHOL 166 01/14/2017 0845   TRIG 92 01/14/2017 0845   HDL 68 01/14/2017 0845   CHOLHDL 2.4 01/14/2017 0845   VLDL 18 01/14/2017 0845   LDLCALC 80 01/14/2017 0845     Imaging I have reviewed the images obtained:  CT-scan of the brain -There is no acute intracranial hemorrhage or evidence of acute infarction. ASPECT score is 10.  CTA Head and Neck -No large vessel occlusion, hemodynamically significant stenosis, or evidence of dissection.  MRI examination of the brain- pending  Assessment: 56 y.o. female presenting with dysarthria and ataxia. She was eating grits, an orange, and peanuts for breakfast when her mouth started to go numb and tingle.   Impression: TIA vs. Stroke  Recommendations: - HgbA1c, fasting lipid panel - MRI  of the brain without  contrast - Frequent neuro checks - Echocardiogram - Carotid dopplers - Prophylactic therapy-Antiplatelet med: Aspirin - dose '325mg'$  PO or '300mg'$  PR - Risk factor modification - Telemetry monitoring - PT consult, OT consult, Speech consult - Stroke team to follow   Patient seen and examined by NP/APP with MD. MD to update note as needed.   Janine Ores, DNP, FNP-BC [facility] Pager: [phone]   Attending Attestation:  Patient seen, examined, labs,vitals and notes reviewed. Discussed plan with Shari Merl, NP and agree with assessment and plan as documented above. I have independently reviewed the chart, obtained history, review of systems and examined the patient.  Electronically signed by:  Lynnae Sandhoff, MD Page: [phone] 01/27/2022, 2:44 PM

## 2022-01-27 NOTE — ED Provider Notes (Addendum)
[facility] EMERGENCY DEPARTMENT Provider Note   CSN: [id number] Arrival date & time: 01/27/22  1053  An emergency department physician performed an initial assessment on this suspected stroke patient at 1053.  History  Chief Complaint  Patient presents with   Code Stroke    Shari Gibson is a 56 y.o. female.  Patient brought in by EMS for presumed stroke.  They were called out for allergic reaction.  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  She thought she was having an allergic reaction to peanuts her mouth became numb.  When EMS got there they noted that there was speech problem as well and then she has significant ataxia.  So patient arrived as a code stroke seen by the code stroke team.  Martin Majestic immediately to head CT without any acute findings.  Patient is never had anything like this before.  Patient quit smoking in 2016 past medical history significant for asthma gastroesophageal reflux disease.  Past surgical history tubal ligation.    Patient still having difficulty speaking.  Denies any tongue swelling denies any wheezing shortness of breath.  No obvious swelling to the lips.       Home Medications Prior to Admission medications   Medication Sig Start Date End Date Taking? Authorizing Provider  acetaminophen (TYLENOL) 500 MG tablet Take 500-1,000 mg by mouth every 6 (six) hours as needed for mild pain (muscle aches).    [provider]  albuterol (PROVENTIL HFA;VENTOLIN HFA) 108 (90 Base) MCG/ACT inhaler Inhale 2 puffs into the lungs every 6 (six) hours as needed for wheezing or shortness of breath. 11/18/18   Henson, Vickie L, NP-C  esomeprazole (NEXIUM) 20 MG capsule Take 20 mg by mouth daily at 12 noon.    [provider]  folic acid (FOLVITE) 1 MG tablet Take 1 tablet (1 mg total) by mouth daily. 05/29/18   Raiford Noble Latif, DO  iron  polysaccharides (NIFEREX) 150 MG capsule Take 1 capsule (150 mg total) by mouth daily. 05/28/18   Raiford Noble Latif, DO  Multiple Vitamin (MULTIVITAMIN WITH MINERALS) TABS tablet Take 1 tablet by mouth daily. 05/29/18   Raiford Noble Latif, DO  thiamine 100 MG tablet Take 1 tablet (100 mg total) by mouth daily. 05/29/18   Kerney Elbe, DO      Allergies    Patient has no known allergies.    Review of Systems   Review of Systems  Constitutional:  Negative for chills and fever.  HENT:  Negative for ear pain, facial swelling, sore throat and trouble swallowing.   Eyes:  Negative for pain and visual disturbance.  Respiratory:  Negative for cough and shortness of breath.   Cardiovascular:  Negative for chest pain and palpitations.  Gastrointestinal:  Negative for abdominal pain and vomiting.  Genitourinary:  Negative for dysuria and hematuria.  Musculoskeletal:  Negative for arthralgias and back pain.  Skin:  Negative for color change and rash.  Neurological:  Positive for speech difficulty and numbness. Negative for seizures and syncope.  All other systems reviewed and are negative.   Physical Exam Updated Vital Signs BP (!) 165/94   Temp 97.7 F (36.5 C) (Temporal)   Resp 15   Ht 1.702 m ('5\' 7"'$ )   Wt 96.8 kg   BMI 33.42 kg/m  Physical Exam Vitals and nursing note reviewed.  Constitutional:      General:  She is not in acute distress.    Appearance: Normal appearance. She is well-developed.  HENT:     Head: Normocephalic and atraumatic.     Mouth/Throat:     Mouth: Mucous membranes are moist.     Comments: No tongue swelling no lip swelling. Eyes:     Extraocular Movements: Extraocular movements intact.     Conjunctiva/sclera: Conjunctivae normal.     Pupils: Pupils are equal, round, and reactive to light.  Cardiovascular:     Rate and Rhythm: Normal rate and regular rhythm.     Heart sounds: No murmur heard. Pulmonary:     Effort: Pulmonary effort is normal. No  respiratory distress.     Breath sounds: Normal breath sounds.  Abdominal:     Palpations: Abdomen is soft.     Tenderness: There is no abdominal tenderness.  Musculoskeletal:        General: No swelling.     Cervical back: Neck supple.  Skin:    General: Skin is warm and dry.     Capillary Refill: Capillary refill takes less than 2 seconds.  Neurological:     Mental Status: She is alert and oriented to person, place, and time.     Cranial Nerves: Cranial nerve deficit present.     Sensory: No sensory deficit.     Motor: No weakness.     Coordination: Coordination abnormal.     Comments: Patient with difficulty speaking.  Seems also to have a left facial droop.  Clear ataxia left upper extremity greater than right upper extremity and right lower extremity greater than left lower extremity.  Psychiatric:        Mood and Affect: Mood normal.     ED Results / Procedures / Treatments   Labs (all labs ordered are listed, but only abnormal results are displayed) Labs Reviewed  CBC - Abnormal; Notable for the following components:      Result Value   HCT 35.8 (*)    All other components within normal limits  COMPREHENSIVE METABOLIC PANEL - Abnormal; Notable for the following components:   Potassium 3.4 (*)    Glucose, Bld 128 (*)    All other components within normal limits  I-STAT CHEM 8, ED - Abnormal; Notable for the following components:   Glucose, Bld 122 (*)    Calcium, Ion 1.13 (*)    All other components within normal limits  CBG MONITORING, ED - Abnormal; Notable for the following components:   Glucose-Capillary 125 (*)    All other components within normal limits  RESP PANEL BY RT-PCR (FLU A&B, COVID) ARPGX2  ETHANOL  PROTIME-INR  APTT  DIFFERENTIAL  RAPID URINE DRUG SCREEN, HOSP PERFORMED  URINALYSIS, ROUTINE W REFLEX MICROSCOPIC  I-STAT BETA HCG BLOOD, ED ([facility], [facility], [facility] ONLY)    EKG EKG Interpretation  Date/Time:  Tuesday January 27 2022 11:16:04 EDT Ventricular  Rate:  96 PR Interval:  181 QRS Duration: 73 QT Interval:  336 QTC Calculation: 425 R Axis:   -14 Text Interpretation: Sinus rhythm Low voltage, precordial leads Confirmed by Fredia Sorrow [phone]) on 01/27/2022 11:36:27 AM  Radiology CT ANGIO HEAD NECK W WO CM (CODE STROKE)  Result Date: 01/27/2022 CLINICAL DATA:  Neuro deficit, acute, stroke suspected EXAM: CT ANGIOGRAPHY HEAD AND NECK TECHNIQUE: Multidetector CT imaging of the head and neck was performed using the standard protocol during bolus administration of intravenous contrast. Multiplanar CT image reconstructions and MIPs were obtained to evaluate the vascular anatomy. Carotid stenosis  measurements (when applicable) are obtained utilizing NASCET criteria, using the distal internal carotid diameter as the denominator. RADIATION DOSE REDUCTION: This exam was performed according to the departmental dose-optimization program which includes automated exposure control, adjustment of the mA and/or kV according to patient size and/or use of iterative reconstruction technique. CONTRAST:  61m OMNIPAQUE IOHEXOL 350 MG/ML SOLN COMPARISON:  None Available. FINDINGS: CTA NECK Aortic arch: Unremarkable. Great vessel origins are patent. There is direct origin of the left vertebral from the arch. Right carotid system: Patent. Partially retropharyngeal course. Eccentric noncalcified plaque along the ICA origin with less than 50% stenosis. Left carotid system: Patent. Partially retropharyngeal course. No stenosis. Vertebral arteries: Patent and codominant.  No stenosis. Skeleton: Overall minor cervical spine degenerative changes. There is marked left facet arthropathy at C2-C3. Other neck: Unremarkable. Upper chest: Included lung apices are clear. Review of the MIP images confirms the above findings CTA HEAD Anterior circulation: Intracranial internal carotid arteries are patent. Anterior and middle cerebral arteries are patent. Posterior circulation:  Intracranial vertebral arteries are patent. Basilar artery is patent. Major cerebellar artery origins are patent. Bilateral posterior communicating arteries are present. Posterior cerebral arteries are patent. Venous sinuses: Patent as allowed by contrast bolus timing. Review of the MIP images confirms the above findings IMPRESSION: No large vessel occlusion, hemodynamically significant stenosis, or evidence of dissection. Electronically Signed   By: PMacy MisM.D.   On: 01/27/2022 11:18   CT HEAD CODE STROKE WO CONTRAST  Result Date: 01/27/2022 CLINICAL DATA:  Code stroke.  Neuro deficit, acute, stroke suspected EXAM: CT HEAD WITHOUT CONTRAST TECHNIQUE: Contiguous axial images were obtained from the base of the skull through the vertex without intravenous contrast. RADIATION DOSE REDUCTION: This exam was performed according to the departmental dose-optimization program which includes automated exposure control, adjustment of the mA and/or kV according to patient size and/or use of iterative reconstruction technique. COMPARISON:  None Available. FINDINGS: Brain: No acute intracranial hemorrhage, mass effect, or edema. Ventricles and sulci are normal in size and configuration. Gray-white differentiation is preserved. Vascular: No hyperdense vessel. Skull: Unremarkable. Sinuses/Orbits: No acute abnormality. Other: Mastoid air cells are clear. ASPECTS (AAndersonStroke Program Early CT Score) - Ganglionic level infarction (caudate, lentiform nuclei, internal capsule, insula, M1-M3 cortex): 7 - Supraganglionic infarction (M4-M6 cortex): 3 Total score (0-10 with 10 being normal): 10 IMPRESSION: There is no acute intracranial hemorrhage or evidence of acute infarction. ASPECT score is 10. These results were communicated to Dr. CTheda Sersat 11:04 am on 01/27/2022 by text page via the [facility]messaging system. Electronically Signed   By: PMacy MisM.D.   On: 01/27/2022 11:09    Procedures Procedures     Medications Ordered in ED Medications  iohexol (OMNIPAQUE) 350 MG/ML injection 50 mL (50 mLs Intravenous Contrast Given 01/27/22 1110)    ED Course/ Medical Decision Making/ A&P                           Medical Decision Making Amount and/or Complexity of Data Reviewed Labs: ordered. Radiology: ordered.  Risk Decision regarding hospitalization.   CRITICAL CARE Performed by: SFredia SorrowTotal critical care time: 45 minutes Critical care time was exclusive of separately billable procedures and treating other patients. Critical care was necessary to treat or prevent imminent or life-threatening deterioration. Critical care was time spent personally by me on the following activities: development of treatment plan with patient and/or surrogate as well as nursing, discussions with consultants,  evaluation of patient's response to treatment, examination of patient, obtaining history from patient or surrogate, ordering and performing treatments and interventions, ordering and review of laboratory studies, ordering and review of radiographic studies, pulse oximetry and re-evaluation of patient's condition.  Patient this sounds as if onset of strokelike symptoms at 930 this morning.  Seen by stroke team.  Patient's clearly has ataxia speech difficulties.  I think she is got some left facial droop as well.  CT head without any acute findings.  CTA head and neck without any acute findings.  Stroke team is following her carefully.  Patient technically within the window perhaps for TNK and less they are just not sure about last known normal but she was in the breakfast area at work cafeteria at work with coworkers when this happened.  Pregnancy test negative.  DBC no leukocytosis hemoglobin 34.1 complete metabolic panel no liver function test abnormalities on the complete metabolic panel potassium down a little bit at 3.4 blood sugar good at 128 CT angio head and neck as we discussed no acute  intracranial hemorrhage or evidence of acute infarction aspect score is 10.  Dr. Theda Sers was notified of this.  MRI most likely whether any acute intervention as per neuro hospitalist team stroke team.  Patient without any prior history of stroke.  No significant stroke risk factors.  Patient not on blood thinners.  Patient's speech problems completely resolved.  No facial droop.  Neurology recommending hospitalist admission.  MRI stroke work-up inpatient.  Final Clinical Impression(s) / ED Diagnoses Final diagnoses:  Cerebrovascular accident (CVA), unspecified mechanism [facility])    Rx / DC Orders ED Discharge Orders     None         Fredia Sorrow, MD 01/27/22 1153    Fredia Sorrow, MD 01/27/22 1247

## 2022-01-27 NOTE — Code Documentation (Signed)
Stroke Response Nurse Documentation Code Documentation  Shari Gibson is a 56 y.o. female arriving to [facility]  via [facility] on 01/27/22 with past medical hx of asthma, anemia, GERD. On No antithrombotic. Code stroke was activated by EMS.   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    Stroke team at the bedside on patient arrival. Labs drawn and patient cleared for CT by Dr. Rogene Houston. Patient to CT with team. NIHSS 3, see documentation for details and code stroke times. Patient with bilateral limb ataxia and dysarthria  on exam. The following imaging was completed:  CT Head and CTA. Patient is not a candidate for IV Thrombolytic due to symptoms too mild and improving. Patient is not a candidate for IR due to no LVO per MD.    Care Plan: Q30 neuro checks and vitals in the TNK window until 1400, MRI.    Bedside handoff with ED RN [name].    Shari Gibson, Shari Gibson  Stroke Response RN

## 2022-01-28 ENCOUNTER — Observation Stay (HOSPITAL_BASED_OUTPATIENT_CLINIC_OR_DEPARTMENT_OTHER): Payer: Managed Care, Other (non HMO)

## 2022-01-28 ENCOUNTER — Other Ambulatory Visit (HOSPITAL_COMMUNITY): Payer: Self-pay

## 2022-01-28 DIAGNOSIS — I6389 Other cerebral infarction: Secondary | ICD-10-CM | POA: Diagnosis not present

## 2022-01-28 DIAGNOSIS — I10 Essential (primary) hypertension: Secondary | ICD-10-CM | POA: Diagnosis not present

## 2022-01-28 DIAGNOSIS — G459 Transient cerebral ischemic attack, unspecified: Secondary | ICD-10-CM

## 2022-01-28 LAB — ECHOCARDIOGRAM COMPLETE
AR max vel: 2.83 cm2
AV Peak grad: 8 mmHg
Ao pk vel: 1.41 m/s
Area-P 1/2: 4.46 cm2
Calc EF: 62.2 %
Height: 67 in
S' Lateral: 2.7 cm
Single Plane A2C EF: 63 %
Single Plane A4C EF: 62.1 %
Weight: 3414.48 oz

## 2022-01-28 LAB — HEMOGLOBIN A1C
Hgb A1c MFr Bld: 5 % (ref 4.8–5.6)
Mean Plasma Glucose: 96.8 mg/dL

## 2022-01-28 MED ORDER — ASPIRIN 81 MG PO TBEC
81.0000 mg | DELAYED_RELEASE_TABLET | Freq: Every day | ORAL | 0 refills | Status: AC
  Filled 2022-01-28: qty 30, 30d supply, fill #0

## 2022-01-28 MED ORDER — EPINEPHRINE 0.3 MG/0.3ML IJ SOAJ
0.3000 mg | INTRAMUSCULAR | 0 refills | Status: DC | PRN
  Filled 2022-01-28: qty 1, 30d supply, fill #0

## 2022-01-28 MED ORDER — ROSUVASTATIN CALCIUM 20 MG PO TABS
20.0000 mg | ORAL_TABLET | Freq: Every day | ORAL | 0 refills | Status: DC
  Filled 2022-01-28: qty 30, 30d supply, fill #0

## 2022-01-28 MED ORDER — CLOPIDOGREL BISULFATE 75 MG PO TABS
75.0000 mg | ORAL_TABLET | Freq: Every day | ORAL | 0 refills | Status: DC
Start: 2022-01-29 — End: 2022-02-15
  Filled 2022-01-28: qty 21, 21d supply, fill #0

## 2022-01-28 NOTE — Hospital Course (Addendum)
AM Interview 01/28/22: It started yesterday right after breakfast. She had some stinging and pain in her eye and tongue. It happened after she ate, she sought help from the emergency team at work. She eventually had trouble speaking. She does not have any history of allergies, no hives or trouble breathing. She ate grits and an orange. No peanuts. She has not had a peanut allergy in the past. She did not take benadryl. No tongue swelling.   Recently her blood pressure medication was increased.   She has stopped meat and dairy for the past 7 days.   She alternates drinking mixed drinks and half a pint of vodka. She drinks 'here and there', and has been drinking more frequently for the past 4 months. She stopped drinking a week ago.   Today, she feels 98% better. She still has some trouble speaking, which is the remaining 2%. Her tongue feeling is back. No chest pain. No dyspnea. No trouble swallowing.  PCP is [place].

## 2022-01-28 NOTE — Progress Notes (Signed)
Patient given discharge instructions and stated understanding. 

## 2022-01-28 NOTE — Progress Notes (Addendum)
STROKE TEAM PROGRESS NOTE   ATTENDING NOTE: I reviewed above note and agree with the assessment and plan. Pt was seen and examined.   56 year old female with history of asthma and hypertension admitted for ataxia, slurred speech and most numbness and tingling.  CT no acute finding.  CT head and neck unremarkable.  MRI no acute infarct.  EF 65 to 70%.  LDL 75, A1c 5.0.  UDS negative.  Creatinine 0.70.  On exam, patient neurologically intact.  No residual deficit.  Etiology for patient symptoms concerning for TIA.  Started aspirin 81 and Plavix 75 DAPT for 3 weeks and then aspirin alone.  Started Crestor 20, continue on discharge.  Stroke risk factor modification education provided.  PT/OT no recommendation.  For detailed assessment and plan, please refer to above as I have made changes wherever appropriate.   Neurology will sign off. Please call with questions. Pt will follow up with stroke clinic NP at [facility] in about 4 weeks. Thanks for the consult.   Rosalin Hawking, MD PhD Stroke Neurology 01/28/2022 10:26 PM    SUBJECTIVE (INTERVAL HISTORY) Patient sitting at edge of her bed and states she ia at her baseline. She completed her MRI Brain which is normal She just completed her Echocardiogram No c/o headaches , slurred speech, swallowing difficulty, visual changes, paresthesia or new weakness.   OBJECTIVE Vitals:   01/27/22 2325 01/28/22 0325 01/28/22 0929 01/28/22 1208  BP: 124/64 114/71 (!) 148/86 117/75  Pulse: 79 65 72 80  Resp: '14 12 20 20  '$ Temp: 97.7 F (36.5 C) 98 F (36.7 C) 98.4 F (36.9 C) 97.8 F (36.6 C)  TempSrc: Oral  Oral Oral  SpO2: 100% 99% 100%   Weight:      Height:        CBC:  Recent Labs  Lab 01/27/22 1100 01/27/22 1102  WBC 7.4  --   NEUTROABS 4.6  --   HGB 12.2 12.6  HCT 35.8* 37.0  MCV 92.0  --   PLT 348  --     Basic Metabolic Panel:  Recent Labs  Lab 01/27/22 1100 01/27/22 1102  NA 137 138  K 3.4* 3.6  CL 101 100  CO2 25  --    GLUCOSE 128* 122*  BUN 12 15  CREATININE 0.75 0.70  CALCIUM 9.7  --     Lipid Panel:  Recent Labs  Lab 01/27/22 2146  CHOL 173  TRIG 144  HDL 69  CHOLHDL 2.5  VLDL 29  LDLCALC 75   HgbA1c:  Lab Results  Component Value Date   HGBA1C 5.0 01/27/2022   Urine Drug Screen:     Component Value Date/Time   LABOPIA NONE DETECTED 01/27/2022 1159   COCAINSCRNUR NONE DETECTED 01/27/2022 1159   LABBENZ NONE DETECTED 01/27/2022 1159   AMPHETMU NONE DETECTED 01/27/2022 1159   THCU NONE DETECTED 01/27/2022 1159   LABBARB NONE DETECTED 01/27/2022 1159    Alcohol Level     Component Value Date/Time   ETH <10 01/27/2022 1100    IMAGING  Results for orders placed or performed during the hospital encounter of 01/27/22  MR Brain Wo Contrast (neuro protocol)   Narrative   CLINICAL DATA:  Mouth numbness, speech problem ataxia  EXAM: MRI HEAD WITHOUT CONTRAST  TECHNIQUE: Multiplanar, multiecho pulse sequences of the brain and surrounding structures were obtained without intravenous contrast.  COMPARISON:  Same-day CT/CTA head and neck  FINDINGS: Brain: There is no acute intracranial hemorrhage, extra-axial fluid collection, or  acute infarct  Parenchymal volume is normal. The ventricles are normal in size. Gray-white differentiation is preserved. Parenchymal signal is normal.  There is no mass lesion.  There is no mass effect or midline shift.  Vascular: Normal flow voids.  Skull and upper cervical spine: Normal marrow signal.  Sinuses/Orbits: The paranasal sinuses are clear. The globes and orbits are unremarkable.  Other: None.  IMPRESSION: Normal brain MRI.   Electronically Signed   By: Valetta Mole M.D.   On: 01/27/2022 15:19        Neuro Exam: mental Status: Awake, alert, and oriented to person, place, time, and situation. She is able to provide a clear and coherent history of present illness. Speech/Language: speech is normal  Naming,  repetition, fluency, and comprehension intact without aphasia  No neglect is noted Cranial Nerves:  II: PERRL 3 mm/brisk. visual fields full.  III, IV, VI: EOMI. Lid elevation symmetric and full.  V: Sensation is intact to light touch and symmetrical to face. Blinks to threat. Moves jaw back and forth.  VII: Face is symmetric resting and smiling. Able to puff cheeks and raise eyebrows.  VIII: Hearing intact to voice IX, X: Palate elevation is symmetric. Phonation normal.  XI: Normal sternocleidomastoid and trapezius muscle strength XII: Tongue protrudes midline without fasciculations.   Motor: 5/5 strength is all muscle groups.  Tone is normal. Bulk is normal.  Sensation: Intact to light touch bilaterally in all four extremities. No extinction to DSS present.  Coordination: Ataxia noted with FTN and HKS in all extremities DTRs: 2+ throughout.  Gait: is normal    ASSESSMENT/PLAN [name] is a 56 y.o. female with history of anemia,asthma ,fibroids  presenting with .dysarthria and ataxia. Patient @ the time was at work eating breakfast. She c/o mouth numbness and tingling . EMS was called and they noticed some intermittent dysarthria and ataxia. Patient was brought in as a code stroke with negative findings on CT HEAD and CTA Head/Neck . MRI brain was also negative for any acute pathology and her symptomology is consistent with transient ischemic disease.  Stroke/TIA   CT head negative  CTA Head/Neck: No LVO , No hemodynamically significant stenosis or evidence of dissection MRI head : Normal  2D Echo  : LVEF 65-70%, left and right atrial size is normal . No atrial level shunt detected  LDL -75 HgbA1c -  5.0 % SCD for VTE prophylaxis Diet Order             Diet - low sodium heart healthy           Diet regular Room service appropriate? Yes; Fluid consistency: Thin  Diet effective now                 TIA Started on DAPT for 3 weeks ASA 81 mg chewable po qday +  Plavix 75 mg qday and after 21 days stop Plavix and continue with ASA indefibetly Ongoing aggressive stroke risk factor management Therapy recommendations:  None Follow up with PCP Disposition:  Discharge Home per primary team    Hyperlipidemia LDL 75, goal < 70 Continue statin at discharge  [facility])  Hospital day # 0  [place] , [place]  To contact Stroke Continuity provider, please refer to [email or web address] After hours, contact General Neurology

## 2022-01-28 NOTE — Progress Notes (Signed)
Patient is ready for discharged. Spoke with Dr. Erlinda Hong with stroke team, who recommended 3 weeks of DAPT ASA/Plavix and ASA alone. Crestor will be continued.

## 2022-01-28 NOTE — Discharge Summary (Addendum)
Name: Shari Gibson MRN: [id number] DOB: May 26, 1966 56 y.o. PCP: Patient, No Pcp Per  Date of Admission: 01/27/2022 10:53 AM Date of Discharge: 01/28/22 Attending Physician: Dr. Philipp Ovens  Discharge Diagnosis: Principal Problem:   Possible TIA vs food allergy Active Problems:   Gastroesophageal reflux disease   Essential hypertension    Discharge Medications: Allergies as of 01/28/2022   No Known Allergies      Medication List     TAKE these medications    acetaminophen 500 MG tablet Commonly known as: TYLENOL Take 500-1,000 mg by mouth every 6 (six) hours as needed for mild pain (muscle aches).   albuterol 108 (90 Base) MCG/ACT inhaler Commonly known as: VENTOLIN HFA Inhale 2 puffs into the lungs every 6 (six) hours as needed for wheezing or shortness of breath.   aspirin EC 81 MG tablet Take 1 tablet (81 mg total) by mouth daily. Swallow whole. Start taking on: January 29, 2022   budesonide-formoterol 80-4.5 MCG/ACT inhaler Commonly known as: SYMBICORT Inhale 2 puffs into the lungs in the morning and at bedtime.   clopidogrel 75 MG tablet Commonly known as: PLAVIX Take 1 tablet (75 mg total) by mouth daily for 21 days. Start taking on: January 29, 2022   EPINEPHrine 0.3 mg/0.3 mL Soaj injection Commonly known as: EPI-PEN Inject 0.3 mg into the muscle as needed for anaphylaxis.   esomeprazole 20 MG capsule Commonly known as: NEXIUM Take 20 mg by mouth daily at 12 noon.   folic acid 1 MG tablet Commonly known as: FOLVITE Take 1 tablet (1 mg total) by mouth daily.   ibuprofen 200 MG tablet Commonly known as: ADVIL Take 200 mg by mouth every 6 (six) hours as needed (for pain).   iron polysaccharides 150 MG capsule Commonly known as: NIFEREX Take 1 capsule (150 mg total) by mouth daily.   multivitamin with minerals Tabs tablet Take 1 tablet by mouth daily.   rosuvastatin 20 MG tablet Commonly known as: CRESTOR Take 1 tablet (20 mg total) by mouth  daily. Start taking on: January 29, 2022   thiamine 100 MG tablet Take 1 tablet (100 mg total) by mouth daily.   valsartan-hydrochlorothiazide 160-25 MG tablet Commonly known as: DIOVAN-HCT Take 1 tablet by mouth daily.        Disposition and follow-up:   [name] was discharged from [facility] in Stable condition.  At the hospital follow up visit please address:  1.  Follow-up:  TIA vs Food allergy - Assess for any neurological changes.  Patient was started on DAPT with aspirin and Plavix for 3 weeks then aspirin alone.  Also started on Crestor. - Prescribed EpiPen for possible food allergy.  She will benefit from outpatient Allergist referral to work-up any new food allergy.   Hypertension -Resume home valsartan-HCTZ  2.  Labs / imaging needed at time of follow-up: CBC, BMP  3.  Pending labs/ test needing follow-up: None  Follow-up Appointments:  Follow-up Information     [facility]. Schedule an appointment as soon as possible for a visit in 1 month(s).   Specialty: Neurology Why: stroke clinic Contact information: [place] [facility] Course by problem list:  Shari Gibson is a 56 year old female with hypertension, obesity, history of tobacco and alcohol use presenting with abnormal sensation in her throat, dysarthria, perioral numbness, and  ataxia that began the morning of admission, admitted for stroke workup.  TIA vs Food allergy Patient presented to the ED following an episode of brief dysarthria, perioral numbness, abnormal sensation in her throat, and witnessed ataxia with onset while eating breakfast.  Her breakfast consisted of grits, toast and a can of mixed nuts.  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  They initially thought she was experiencing a food allergy, however, they noted dysarthria  and concern for possible stroke.  She did not receive Benadryl or any form of antihistamine.  By the time she arrived to the ED her symptoms resolved.  Code stroke was initiated in the ED. CT head, CTA of head and neck, and MRI brain were all unremarkable.  Echocardiogram was normal.  A1c of 5 and LDL 75.   She was started on DAPT with with aspirin and Plavix for 3 weeks then aspirin alone.  Crestor was also initiated.  Home hypertensive medications were held and resumed after discharge.  Given possibility of food allergy, an EpiPen was prescribed.  Recommended follow-up with an outpatient allergist for further work-up.   Discharge Subjective: Patient states she was feeling more like herself today. She feels back at baseline. She denies any dysarthria, dysphagia or odynophagia. She denies chest pain or SOB. Denies any other complaints at this time.  Discharge Exam:   BP 117/75 (BP Location: Right Arm)   Pulse 80   Temp 97.8 F (36.6 C) (Oral)   Resp 20   Ht '5\' 7"'$  (1.702 m)   Wt 96.8 kg   SpO2 100%   BMI 33.42 kg/m  Constitutional: well-appearing middle-aged women sitting up in bed, in no acute distress HENT: normocephalic atraumatic, mucous membranes moist Eyes: conjunctiva non-erythematous Neck: supple Cardiovascular: regular rate and rhythm, no m/r/g Pulmonary/Chest: normal work of breathing on room air, lungs clear to auscultation bilaterally Abdominal: soft, non-tender, non-distended MSK: normal bulk and tone Neurological: alert & oriented x 3, 5/5 strength in bilateral upper and lower extremities, no facial asymmetry, No dysarthria  Skin: warm and dry Psych: Normal mood, normal behavior   Pertinent Labs, Studies, and Procedures:     Latest Ref Rng & Units 01/27/2022   11:02 AM 01/27/2022   11:00 AM 06/09/2018   10:20 AM  CBC  WBC 4.0 - 10.5 K/uL  7.4  8.6   Hemoglobin 12.0 - 15.0 g/dL 12.6  12.2  10.0   Hematocrit 36.0 - 46.0 % 37.0  35.8  33.9   Platelets 150 - 400 K/uL   348  514        Latest Ref Rng & Units 01/27/2022   11:02 AM 01/27/2022   11:00 AM 06/09/2018   10:20 AM  CMP  Glucose 70 - 99 mg/dL 122  128  84   BUN 6 - 20 mg/dL '15  12  7   '$ Creatinine 0.44 - 1.00 mg/dL 0.70  0.75  0.62   Sodium 135 - 145 mmol/L 138  137  139   Potassium 3.5 - 5.1 mmol/L 3.6  3.4  4.5   Chloride 98 - 111 mmol/L 100  101  100   CO2 22 - 32 mmol/L  25  24   Calcium 8.9 - 10.3 mg/dL  9.7  9.5   Total Protein 6.5 - 8.1 g/dL  7.2  7.4   Total Bilirubin 0.3 - 1.2 mg/dL  0.6  <0.2   Alkaline Phos 38 - 126 U/L  43  59   AST 15 - 41 U/L  22  25   ALT 0 - 44 U/L  19  33     MR Brain Wo Contrast (neuro protocol)  Result Date: 01/27/2022 CLINICAL DATA:  Mouth numbness, speech problem ataxia EXAM: MRI HEAD WITHOUT CONTRAST TECHNIQUE: Multiplanar, multiecho pulse sequences of the brain and surrounding structures were obtained without intravenous contrast. COMPARISON:  Same-day CT/CTA head and neck FINDINGS: Brain: There is no acute intracranial hemorrhage, extra-axial fluid collection, or acute infarct Parenchymal volume is normal. The ventricles are normal in size. Gray-white differentiation is preserved. Parenchymal signal is normal. There is no mass lesion.  There is no mass effect or midline shift. Vascular: Normal flow voids. Skull and upper cervical spine: Normal marrow signal. Sinuses/Orbits: The paranasal sinuses are clear. The globes and orbits are unremarkable. Other: None. IMPRESSION: Normal brain MRI. Electronically Signed   By: Valetta Mole M.D.   On: 01/27/2022 15:19   CT ANGIO HEAD NECK W WO CM (CODE STROKE)  Result Date: 01/27/2022 CLINICAL DATA:  Neuro deficit, acute, stroke suspected EXAM: CT ANGIOGRAPHY HEAD AND NECK TECHNIQUE: Multidetector CT imaging of the head and neck was performed using the standard protocol during bolus administration of intravenous contrast. Multiplanar CT image reconstructions and MIPs were obtained to evaluate the vascular anatomy.  Carotid stenosis measurements (when applicable) are obtained utilizing NASCET criteria, using the distal internal carotid diameter as the denominator. RADIATION DOSE REDUCTION: This exam was performed according to the departmental dose-optimization program which includes automated exposure control, adjustment of the mA and/or kV according to patient size and/or use of iterative reconstruction technique. CONTRAST:  55m OMNIPAQUE IOHEXOL 350 MG/ML SOLN COMPARISON:  None Available. FINDINGS: CTA NECK Aortic arch: Unremarkable. Great vessel origins are patent. There is direct origin of the left vertebral from the arch. Right carotid system: Patent. Partially retropharyngeal course. Eccentric noncalcified plaque along the ICA origin with less than 50% stenosis. Left carotid system: Patent. Partially retropharyngeal course. No stenosis. Vertebral arteries: Patent and codominant.  No stenosis. Skeleton: Overall minor cervical spine degenerative changes. There is marked left facet arthropathy at C2-C3. Other neck: Unremarkable. Upper chest: Included lung apices are clear. Review of the MIP images confirms the above findings CTA HEAD Anterior circulation: Intracranial internal carotid arteries are patent. Anterior and middle cerebral arteries are patent. Posterior circulation: Intracranial vertebral arteries are patent. Basilar artery is patent. Major cerebellar artery origins are patent. Bilateral posterior communicating arteries are present. Posterior cerebral arteries are patent. Venous sinuses: Patent as allowed by contrast bolus timing. Review of the MIP images confirms the above findings IMPRESSION: No large vessel occlusion, hemodynamically significant stenosis, or evidence of dissection. Electronically Signed   By: PMacy MisM.D.   On: 01/27/2022 11:18   CT HEAD CODE STROKE WO CONTRAST  Result Date: 01/27/2022 CLINICAL DATA:  Code stroke.  Neuro deficit, acute, stroke suspected EXAM: CT HEAD WITHOUT  CONTRAST TECHNIQUE: Contiguous axial images were obtained from the base of the skull through the vertex without intravenous contrast. RADIATION DOSE REDUCTION: This exam was performed according to the departmental dose-optimization program which includes automated exposure control, adjustment of the mA and/or kV according to patient size and/or use of iterative reconstruction technique. COMPARISON:  None Available. FINDINGS: Brain: No acute intracranial hemorrhage, mass effect, or edema. Ventricles and sulci are normal in size and configuration. Gray-white differentiation is preserved. Vascular: No hyperdense vessel. Skull: Unremarkable. Sinuses/Orbits: No acute abnormality. Other: Mastoid air cells are clear. ASPECTS ([facility]) - Ganglionic level infarction (caudate, lentiform nuclei, internal capsule, insula, M1-M3 cortex): 7 - Supraganglionic infarction (M4-M6 cortex): 3 Total score (0-10 with 10 being normal): 10 IMPRESSION: There is no acute intracranial hemorrhage or evidence of acute infarction. ASPECT score is 10. These results were communicated to Dr. Theda Sers at 11:04 am on 01/27/2022 by text page via the [facility] messaging system. Electronically Signed   By: Macy Mis M.D.   On: 01/27/2022 11:09     Discharge Instructions: Discharge Instructions     Ambulatory referral to Neurology   Complete by: As directed    Follow up with stroke clinic NP (Jessica Vanschaick or Cecille Rubin, if both not available, consider Zachery Dauer, or Ahern) at [facility] in about 4 weeks. Thanks.   Call MD for:  difficulty breathing, headache or visual disturbances   Complete by: As directed    Call MD for:  extreme fatigue   Complete by: As directed    Call MD for:  hives   Complete by: As directed    Call MD for:  persistant dizziness or light-headedness   Complete by: As directed    Call MD for:  persistant nausea and vomiting   Complete by: As directed    Call MD for:  severe  uncontrolled pain   Complete by: As directed    Call MD for:  temperature >100.4   Complete by: As directed    Diet - low sodium heart healthy   Complete by: As directed    Discharge instructions   Complete by: As directed    [name],  It was a pleasure taking care of you during this admission.  You were hospitalized for an episode of difficulty breathing and talking.  This could either be a transient ischemic attack (mini stroke) or food allergy.  Your brain MRI was normal and did not show a stroke.  Your heart ultrasound was also normal.  Please start taking Crestor to help reduce your cholesterol.  Also start taking Aspirin and Plavix for 3 weeks then Aspirin alone.  We also prescribed an EpiPen in case you have an anaphylactic reaction.  Please follow-up with your primary care doctor in 1-2 weeks  Take care   Increase activity slowly   Complete by: As directed        Signed: Timothy Lasso, MD Internal Medicine Resident Pager: [phone]

## 2022-01-28 NOTE — TOC Transition Note (Signed)
Transition of Care [facility]) - CM/SW Discharge Note   Patient Details  Name: Kamaya Keckler MRN: [id number] Date of Birth: [birth year]  [facility] CM/SW Contact:  Pollie Friar, RN Phone Number: 01/28/2022, 2:48 PM   Clinical Narrative:    Patient is from home with her daughter that works during the daytime.  Pt drives self as needed and manages her own medications.  PCP: Dr Lindell Noe at [facility] No f/u per PT.  Pt has transport home.    Final next level of care: Home/Self Care Barriers to Discharge: No Barriers Identified   Patient Goals and CMS Choice        Discharge Placement                       Discharge Plan and Services                                     Social Determinants of Health (SDOH) Interventions     Readmission Risk Interventions     No data to display

## 2022-01-28 NOTE — Progress Notes (Signed)
   01/28/22 1430  Clinical Encounter Type  Visited With Patient  Visit Type Initial;Other (Comment) (Advanced Directive)  Referral From Nurse  Consult/Referral To Chaplain   Chaplain responded to a spiritual consult request for advanced directive education. I went over the paperwork with her and advised her to inform the nurse when she has completed the forms to start the next step of the process.   Danice Goltz [facility]  [phone]

## 2022-01-28 NOTE — Evaluation (Signed)
Physical Therapy Evaluation Patient Details Name: Shari Gibson MRN: [id number] DOB: 02-16-1966 Today's Date: 01/28/2022  History of Present Illness  Patient is a 56 year old female with hypertension, obesity, and history of tobacco and alcohol use who presented to the ED as a code stroke after sudden onset dysarthria, globus sensation, perioral numbness, and ataxia. Normal brain MRI  Clinical Impression  Patient is cooperative with PT evaluation. She is independent with mobility at baseline without assistive device. She lives with her children and works/drives.  Today the patient is independent with all activity. She ambulated in the hallway without difficulty without assistive device with no significant change in heart rate noted. Balance is normal with standing, including reaching outside base of support and with ambulation. No acute PT needs are identified at this time as patient is independent with mobility and appears to be at her baseline level of function. PT will sign off at this time.      Recommendations for follow up therapy are one component of a multi-disciplinary discharge planning process, led by the attending physician.  Recommendations may be updated based on patient status, additional functional criteria and insurance authorization.  Follow Up Recommendations No PT follow up    Assistance Recommended at Discharge None  Patient can return home with the following       Equipment Recommendations None recommended by PT  Recommendations for Other Services       Functional Status Assessment Patient has not had a recent decline in their functional status     Precautions / Restrictions Precautions Precautions: None Restrictions Weight Bearing Restrictions: No      Mobility  Bed Mobility Overal bed mobility: Independent                  Transfers Overall transfer level: Independent                      Ambulation/Gait Ambulation/Gait  assistance: Independent Gait Distance (Feet): 150 Feet Assistive device: None Gait Pattern/deviations: WFL(Within Functional Limits) Gait velocity: normal     General Gait Details: no loss of balance with hallway ambulation without assistive device. no significant change in heart rate noted with ambulation  Stairs            Wheelchair Mobility    Modified Rankin (Stroke Patients Only) Modified Rankin (Stroke Patients Only) Pre-Morbid Rankin Score: No symptoms Modified Rankin: No symptoms     Balance Overall balance assessment: Independent                                           Pertinent Vitals/Pain Pain Assessment Pain Assessment: No/denies pain    Home Living Family/patient expects to be discharged to:: Private residence Living Arrangements: Children Available Help at Discharge: Family Type of Home: House Home Access: Stairs to enter   Technical brewer of Steps: 8   Home Layout: Two level;Able to live on main level with bedroom/bathroom Home Equipment: None      Prior Function Prior Level of Function : Independent/Modified Independent;Working/employed;Driving             Mobility Comments: independent with no device ADLs Comments: independent     Hand Dominance   Dominant Hand: Right    Extremity/Trunk Assessment   Upper Extremity Assessment Upper Extremity Assessment: Overall WFL for tasks assessed    Lower Extremity Assessment Lower Extremity  Assessment: Overall WFL for tasks assessed (5/5 bilaterally with ankle dorsiflexion/plantarflexion, hip add/abd, knee extension, and hip flexion. sensation and coordination intact)       Communication   Communication: No difficulties  Cognition Arousal/Alertness: Awake/alert Behavior During Therapy: WFL for tasks assessed/performed Overall Cognitive Status: Within Functional Limits for tasks assessed                                 General Comments:  patient is oriented x 4 and able to follow all commands without difficulty        General Comments      Exercises     Assessment/Plan    PT Assessment Patient does not need any further PT services  PT Problem List         PT Treatment Interventions      PT Goals (Current goals can be found in the Care Plan section)  Acute Rehab PT Goals Patient Stated Goal: to return to work soon PT Goal Formulation: All assessment and education complete, DC therapy    Frequency       Co-evaluation               AM-PAC PT "6 Clicks" Mobility  Outcome Measure Help needed turning from your back to your side while in a flat bed without using bedrails?: None Help needed moving from lying on your back to sitting on the side of a flat bed without using bedrails?: None Help needed moving to and from a bed to a chair (including a wheelchair)?: None Help needed standing up from a chair using your arms (e.g., wheelchair or bedside chair)?: None Help needed to walk in hospital room?: None Help needed climbing 3-5 steps with a railing? : None 6 Click Score: 24    End of Session   Activity Tolerance: Patient tolerated treatment well Patient left:  (sitting on edge of bed eating breakfast) Nurse Communication: Mobility status PT Visit Diagnosis: Other symptoms and signs involving the nervous system (O24.235)    Time: 3614-4315 PT Time Calculation (min) (ACUTE ONLY): 11 min   Charges:   PT Evaluation $PT Eval Low Complexity: 1 Low          {Dezzie Badilla Quentin Cornwall, PT, MPT   Percell Locus 01/28/2022, 10:36 AM

## 2022-01-28 NOTE — Progress Notes (Signed)
OT Cancellation Note  Patient Details Name: Alailah Safley MRN: [id number] DOB: Nov 06, 1965   Cancelled Treatment:    Reason Eval/Treat Not Completed: OT screened, no needs identified, will sign off (Discussed with PT)  Amayiah Gosnell,HILLARY 01/28/2022, 11:39 AM Maurie Boettcher, OT/L   Acute OT Clinical Specialist Acute Rehabilitation Services Pager [phone] Office [phone]

## 2022-01-28 NOTE — Progress Notes (Signed)
SLP Cancellation Note  Patient Details Name: Shari Gibson MRN: [id number] DOB: May 05, 1966   Cancelled treatment:       Reason Eval/Treat Not Completed: SLP screened, no needs identified, will sign off Pt's MRI was normal, pt reported that her speech has returned to baseline, and no overt communication or cognitive-linguistic deficits were noted by physical therapy. A formal evaluation does not appear to be clinically indicated at this time. SLP will sign off.   Shaqueena Mauceri I. Hardin Negus, [place], [place] Office number [phone] Pager [place] 01/28/2022, 11:29 AM

## 2022-01-28 NOTE — Plan of Care (Signed)
Pt doing great. She is independent. Pt has no concerns or questions. Pt is ready to go home. Problem: Education: Goal: Knowledge of General Education information will improve Description: Including pain rating scale, medication(s)/side effects and non-pharmacologic comfort measures Outcome: Adequate for Discharge   Problem: Health Behavior/Discharge Planning: Goal: Ability to manage health-related needs will improve Outcome: Adequate for Discharge   Problem: Clinical Measurements: Goal: Ability to maintain clinical measurements within normal limits will improve Outcome: Adequate for Discharge Goal: Will remain free from infection Outcome: Adequate for Discharge Goal: Diagnostic test results will improve Outcome: Adequate for Discharge Goal: Respiratory complications will improve Outcome: Adequate for Discharge Goal: Cardiovascular complication will be avoided Outcome: Adequate for Discharge   Problem: Activity: Goal: Risk for activity intolerance will decrease Outcome: Adequate for Discharge   Problem: Nutrition: Goal: Adequate nutrition will be maintained Outcome: Adequate for Discharge   Problem: Coping: Goal: Level of anxiety will decrease Outcome: Adequate for Discharge   Problem: Elimination: Goal: Will not experience complications related to bowel motility Outcome: Adequate for Discharge Goal: Will not experience complications related to urinary retention Outcome: Adequate for Discharge   Problem: Pain Managment: Goal: General experience of comfort will improve Outcome: Adequate for Discharge   Problem: Safety: Goal: Ability to remain free from injury will improve Outcome: Adequate for Discharge   Problem: Skin Integrity: Goal: Risk for impaired skin integrity will decrease Outcome: Adequate for Discharge   Problem: Education: Goal: Knowledge of disease or condition will improve Outcome: Adequate for Discharge Goal: Knowledge of secondary prevention will  improve (SELECT ALL) Outcome: Adequate for Discharge Goal: Knowledge of patient specific risk factors will improve (INDIVIDUALIZE FOR PATIENT) Outcome: Adequate for Discharge Goal: Individualized Educational Video(s) Outcome: Adequate for Discharge

## 2022-02-06 ENCOUNTER — Encounter: Payer: Self-pay | Admitting: Internal Medicine

## 2022-02-06 ENCOUNTER — Ambulatory Visit (INDEPENDENT_AMBULATORY_CARE_PROVIDER_SITE_OTHER): Payer: Managed Care, Other (non HMO) | Admitting: Internal Medicine

## 2022-02-06 VITALS — BP 126/60 | HR 85 | Temp 98.1°F | Ht 67.0 in | Wt 208.0 lb

## 2022-02-06 DIAGNOSIS — J4520 Mild intermittent asthma, uncomplicated: Secondary | ICD-10-CM | POA: Diagnosis not present

## 2022-02-06 DIAGNOSIS — F17200 Nicotine dependence, unspecified, uncomplicated: Secondary | ICD-10-CM | POA: Diagnosis not present

## 2022-02-06 DIAGNOSIS — K219 Gastro-esophageal reflux disease without esophagitis: Secondary | ICD-10-CM | POA: Diagnosis not present

## 2022-02-13 ENCOUNTER — Observation Stay (HOSPITAL_BASED_OUTPATIENT_CLINIC_OR_DEPARTMENT_OTHER)
Admission: EM | Admit: 2022-02-13 | Discharge: 2022-02-15 | Disposition: A | Discharge status: Home / Self Care | Payer: Managed Care, Other (non HMO) | Attending: Internal Medicine | Admitting: Internal Medicine

## 2022-02-13 ENCOUNTER — Encounter (HOSPITAL_BASED_OUTPATIENT_CLINIC_OR_DEPARTMENT_OTHER): Payer: Self-pay | Admitting: *Deleted

## 2022-02-13 ENCOUNTER — Other Ambulatory Visit: Payer: Self-pay

## 2022-02-13 DIAGNOSIS — Z87891 Personal history of nicotine dependence: Secondary | ICD-10-CM | POA: Insufficient documentation

## 2022-02-13 DIAGNOSIS — N939 Abnormal uterine and vaginal bleeding, unspecified: Secondary | ICD-10-CM

## 2022-02-13 DIAGNOSIS — I10 Essential (primary) hypertension: Secondary | ICD-10-CM | POA: Diagnosis not present

## 2022-02-13 DIAGNOSIS — D500 Iron deficiency anemia secondary to blood loss (chronic): Principal | ICD-10-CM

## 2022-02-13 DIAGNOSIS — Z8742 Personal history of other diseases of the female genital tract: Secondary | ICD-10-CM

## 2022-02-13 DIAGNOSIS — Z79899 Other long term (current) drug therapy: Secondary | ICD-10-CM | POA: Diagnosis not present

## 2022-02-13 DIAGNOSIS — R531 Weakness: Secondary | ICD-10-CM | POA: Diagnosis present

## 2022-02-13 DIAGNOSIS — Z8673 Personal history of transient ischemic attack (TIA), and cerebral infarction without residual deficits: Secondary | ICD-10-CM

## 2022-02-13 DIAGNOSIS — J45909 Unspecified asthma, uncomplicated: Secondary | ICD-10-CM | POA: Insufficient documentation

## 2022-02-13 DIAGNOSIS — Z7982 Long term (current) use of aspirin: Secondary | ICD-10-CM | POA: Insufficient documentation

## 2022-02-13 DIAGNOSIS — Z7902 Long term (current) use of antithrombotics/antiplatelets: Secondary | ICD-10-CM | POA: Insufficient documentation

## 2022-02-13 DIAGNOSIS — N959 Unspecified menopausal and perimenopausal disorder: Secondary | ICD-10-CM | POA: Diagnosis not present

## 2022-02-13 DIAGNOSIS — E876 Hypokalemia: Secondary | ICD-10-CM | POA: Diagnosis not present

## 2022-02-13 DIAGNOSIS — D649 Anemia, unspecified: Secondary | ICD-10-CM

## 2022-02-13 HISTORY — DX: Cerebral infarction, unspecified: I63.9

## 2022-02-13 HISTORY — DX: Essential (primary) hypertension: I10

## 2022-02-13 LAB — COMPREHENSIVE METABOLIC PANEL
ALT: 18 U/L (ref 0–44)
AST: 22 U/L (ref 15–41)
Albumin: 4.2 g/dL (ref 3.5–5.0)
Alkaline Phosphatase: 48 U/L (ref 38–126)
Anion gap: 9 (ref 5–15)
BUN: 14 mg/dL (ref 6–20)
CO2: 24 mmol/L (ref 22–32)
Calcium: 9.3 mg/dL (ref 8.9–10.3)
Chloride: 105 mmol/L (ref 98–111)
Creatinine, Ser: 1.21 mg/dL — ABNORMAL HIGH (ref 0.44–1.00)
GFR, Estimated: 53 mL/min — ABNORMAL LOW (ref >60)
Glucose, Bld: 110 mg/dL — ABNORMAL HIGH (ref 70–99)
Potassium: 2.9 mmol/L — ABNORMAL LOW (ref 3.5–5.1)
Sodium: 138 mmol/L (ref 135–145)
Total Bilirubin: 0.4 mg/dL (ref 0.3–1.2)
Total Protein: 7.4 g/dL (ref 6.5–8.1)

## 2022-02-13 LAB — CBC WITH DIFFERENTIAL/PLATELET
Abs Immature Granulocytes: 0.02 10*3/uL (ref 0.00–0.07)
Basophils Absolute: 0.1 10*3/uL (ref 0.0–0.1)
Basophils Relative: 1 %
Eosinophils Absolute: 0 10*3/uL (ref 0.0–0.5)
Eosinophils Relative: 1 %
HCT: 22.8 % — ABNORMAL LOW (ref 36.0–46.0)
Hemoglobin: 7.2 g/dL — ABNORMAL LOW (ref 12.0–15.0)
Immature Granulocytes: 0 %
Lymphocytes Relative: 30 %
Lymphs Abs: 2.4 10*3/uL (ref 0.7–4.0)
MCH: 28.6 pg (ref 26.0–34.0)
MCHC: 31.6 g/dL (ref 30.0–36.0)
MCV: 90.5 fL (ref 80.0–100.0)
Monocytes Absolute: 0.5 10*3/uL (ref 0.1–1.0)
Monocytes Relative: 7 %
Neutro Abs: 4.9 10*3/uL (ref 1.7–7.7)
Neutrophils Relative %: 61 %
Platelets: 493 10*3/uL — ABNORMAL HIGH (ref 150–400)
RBC: 2.52 MIL/uL — ABNORMAL LOW (ref 3.87–5.11)
RDW: 13.6 % (ref 11.5–15.5)
WBC: 8 10*3/uL (ref 4.0–10.5)
nRBC: 0 % (ref 0.0–0.2)

## 2022-02-13 LAB — MAGNESIUM: Magnesium: 2.1 mg/dL (ref 1.7–2.4)

## 2022-02-13 LAB — PREPARE RBC (CROSSMATCH)

## 2022-02-13 LAB — OCCULT BLOOD X 1 CARD TO LAB, STOOL: Fecal Occult Bld: NEGATIVE

## 2022-02-13 MED ORDER — ALBUTEROL SULFATE (2.5 MG/3ML) 0.083% IN NEBU
INHALATION_SOLUTION | Freq: Four times a day (QID) | RESPIRATORY_TRACT | Status: DC | PRN
Start: 2022-02-13 — End: 2022-02-15

## 2022-02-13 MED ORDER — ROSUVASTATIN CALCIUM 20 MG PO TABS
20.0000 mg | ORAL_TABLET | Freq: Every day | ORAL | Status: DC
  Administered 2022-02-13 – 2022-02-15 (×3): 20 mg via ORAL
  Filled 2022-02-13 (×3): qty 1

## 2022-02-13 MED ORDER — SENNOSIDES-DOCUSATE SODIUM 8.6-50 MG PO TABS
1.0000 | ORAL_TABLET | Freq: Every evening | ORAL | Status: DC | PRN
Start: 2022-02-13 — End: 2022-02-15

## 2022-02-13 MED ORDER — SODIUM CHLORIDE 0.9 % IV SOLN: 10.0000 mL/h | Freq: Once | INTRAVENOUS | Status: DC

## 2022-02-13 MED ORDER — PANTOPRAZOLE SODIUM 40 MG PO TBEC
40.0000 mg | DELAYED_RELEASE_TABLET | Freq: Every day | ORAL | Status: DC
  Administered 2022-02-14 – 2022-02-15 (×2): 40 mg via ORAL
  Filled 2022-02-13 (×2): qty 1

## 2022-02-13 MED ORDER — ACETAMINOPHEN 650 MG RE SUPP: 650.0000 mg | Freq: Four times a day (QID) | RECTAL | Status: DC | PRN

## 2022-02-13 MED ORDER — POTASSIUM CHLORIDE 10 MEQ/100ML IV SOLN: 10.0000 meq | INTRAVENOUS | Status: DC

## 2022-02-13 MED ORDER — POTASSIUM CHLORIDE CRYS ER 20 MEQ PO TBCR
40.0000 meq | EXTENDED_RELEASE_TABLET | Freq: Once | ORAL | Status: AC
  Administered 2022-02-13: 40 meq via ORAL
  Filled 2022-02-13: qty 2

## 2022-02-13 MED ORDER — POTASSIUM CHLORIDE 10 MEQ/100ML IV SOLN
10.0000 meq | Freq: Once | INTRAVENOUS | Status: AC
  Administered 2022-02-13: 10 meq via INTRAVENOUS
  Filled 2022-02-13: qty 100

## 2022-02-13 MED ORDER — IRBESARTAN 75 MG PO TABS
150.0000 mg | ORAL_TABLET | Freq: Every day | ORAL | Status: DC
  Administered 2022-02-13 – 2022-02-15 (×3): 150 mg via ORAL
  Filled 2022-02-13 (×2): qty 2
  Filled 2022-02-13: qty 1
  Filled 2022-02-13: qty 2

## 2022-02-13 MED ORDER — ACETAMINOPHEN 325 MG PO TABS: 650.0000 mg | ORAL_TABLET | Freq: Four times a day (QID) | ORAL | Status: DC | PRN

## 2022-02-13 NOTE — ED Notes (Signed)
Lab phoned to request to add Mg Level to blood drawn

## 2022-02-13 NOTE — H&P (Signed)
Date: 02/13/2022               Patient Name:  Shari Gibson MRN: [id number]  DOB: 06/02/1966 Age / Sex: 56 y.o., female   PCP: [name], MD         Medical Service: Internal Medicine Teaching Service         Attending Physician: Dr. Lottie Mussel, MD    First Contact: Dr. Romana Juniper Pager: [phone]  Second Contact: Dr. Virl Axe Pager: [phone]       After Hours (After 5p/  First Contact Pager: [phone]  weekends / holidays): Second Contact Pager: [phone]   Chief Complaint: generalized weakness  History of Present Illness: Shari Gibson is a 56 yo female with asthma, hypertension, obesity, previous history of tobacco use, questionable previous TIA, and uterine fibroids, who presents to [facility] with generalized weakness/dizziness. The patient was transferred from [facility] ED to [facility] earlier this evening.   The patient states that she has been feeling generally weak and fatigued for the past week or two. She has also been intermittently lightheaded and dizzy, although she has not had any syncopal events. During this time, the patient states that her menstrual cycle has been much heavier than normal. She has also endorsed a headache and some shortness of breath on exertion during this time period, as well as limited po intake. The patient denies any fevers, chills, vision changes, chest pain, abd pain, n/v/d, melena, hematochezia, or urinary symptoms. She states that she has not had any other source of bleeding aside from her heavy menstrual cycle.   She states that her LMP started around 6/12 (when she was last in the hospital for a questionable TIA). When she was discharged on 6/14, the patient was started on DAPT with aspirin and plavix. After she started these medications, she started going through 8-9 pads/tampons per day and states that she was passing blood clots, as well. The patient then went to see her PCP, who advised her to  only take 1 antiplatelet agent after she was found to have an abnormal CBC. Her bleeding then started to improve, and then she resumed DAPT yesterday, as she thought her period had ended. Today, the patient noticed more bleeding and again was passing some blood clots this morning. Prior to starting DAPT, she denies any  recent heavy cycles prior to this. She does have irregular cycles, as she is perimenopausal and sometimes has no cycles for up to 6 months. Additionally, she has a history of uterine fibriods. The patient had a tubal ligation in 20s, but otherwise has had no other gyn procedures.   Meds:  Albuterol Aspirin 81 mg daily Plavix 75 mg daily - stopped Nexium 20 mg daily Iron polysaccharides 150 mg daily Crestor 20 mg daily Valsartan-HCTZ 160-25 mg daily  Current Meds  Medication Sig   acetaminophen (TYLENOL) 500 MG tablet Take 500-1,000 mg by mouth every 6 (six) hours as needed for mild pain (muscle aches).   albuterol (PROVENTIL HFA;VENTOLIN HFA) 108 (90 Base) MCG/ACT inhaler Inhale 2 puffs into the lungs every 6 (six) hours as needed for wheezing or shortness of breath.   aspirin EC 81 MG tablet Take 1 tablet (81 mg total) by mouth daily. Swallow whole.   budesonide-formoterol (SYMBICORT) 80-4.5 MCG/ACT inhaler Inhale 2 puffs into the lungs in the morning and at bedtime.   clopidogrel (PLAVIX) 75 MG tablet Take 1 tablet (75 mg total) by mouth daily  for 21 days.   esomeprazole (NEXIUM) 20 MG capsule Take 20 mg by mouth daily at 12 noon.   folic acid (FOLVITE) 1 MG tablet Take 1 tablet (1 mg total) by mouth daily.   ibuprofen (ADVIL) 200 MG tablet Take 200 mg by mouth every 6 (six) hours as needed (for pain).   iron polysaccharides (NIFEREX) 150 MG capsule Take 1 capsule (150 mg total) by mouth daily.   rosuvastatin (CRESTOR) 20 MG tablet Take 1 tablet (20 mg total) by mouth daily.   valsartan-hydrochlorothiazide (DIOVAN-HCT) 160-25 MG tablet Take 1 tablet by mouth daily.      Allergies: Allergies as of 02/13/2022   (No Known Allergies)   Past Medical History:  Diagnosis Date   Anemia    Anemia 01/14/2017   Asthma    Fibroids    GERD (gastroesophageal reflux disease)    Hypertension    Stroke [facility])     Family History: Alzheimer's (maternal side), diabetes and hypertension (maternal side)  Social History: Lives in [place] with her daughter. Patient is independent of her ADLs and iADLs.  Has not drank any alcohol since the beginning of June. Quite smoking cigarettes in 1996. No illicit substance use. PCP: Dr. Lindell Noe Works at [facility].   Review of Systems: A complete ROS was negative except as per HPI.   Physical Exam: Blood pressure 120/69, pulse 90, temperature 98.2 F (36.8 C), temperature source Oral, resp. rate 14, height '5\' 7"'$  (1.702 m), weight 96.2 kg, SpO2 100 %.  General: Pleasant, well-appearing female laying in bed. No acute distress. Head: Normocephalic. Atraumatic. CV: RRR. No murmurs, rubs, or gallops. No LE edema Pulmonary: Lungs CTAB. Normal effort. No wheezing or rales. Abdominal: Soft, nontender, nondistended. Normal bowel sounds. Extremities: Palpable radial and DP pulses. Normal ROM. Skin: Warm and dry. No obvious rash or lesions. Neuro: A&Ox3. Moves all extremities. Normal sensation. No focal deficit. Psych: Normal mood and affect   EKG: personally reviewed my interpretation is normal sinus rhythm with no signs of ischemia  CBC    Component Value Date/Time   WBC 8.0 02/13/2022 1642   RBC 2.52 (L) 02/13/2022 1642   HGB 7.2 (L) 02/13/2022 1642   HGB 10.0 (L) 06/09/2018 1020   HCT 22.8 (L) 02/13/2022 1642   HCT 33.9 (L) 06/09/2018 1020   PLT 493 (H) 02/13/2022 1642   PLT 514 (H) 06/09/2018 1020   MCV 90.5 02/13/2022 1642   MCV 74 (L) 06/09/2018 1020   MCH 28.6 02/13/2022 1642   MCHC 31.6 02/13/2022 1642   RDW 13.6 02/13/2022 1642   RDW 22.3 (H) 06/09/2018 1020   LYMPHSABS 2.4 02/13/2022 1642    LYMPHSABS 2.8 06/09/2018 1020   MONOABS 0.5 02/13/2022 1642   EOSABS 0.0 02/13/2022 1642   EOSABS 0.1 06/09/2018 1020   BASOSABS 0.1 02/13/2022 1642   BASOSABS 0.1 06/09/2018 1020   CMP     Component Value Date/Time   NA 138 02/13/2022 1642   NA 139 06/09/2018 1020   K 2.9 (L) 02/13/2022 1642   CL 105 02/13/2022 1642   CO2 24 02/13/2022 1642   GLUCOSE 110 (H) 02/13/2022 1642   BUN 14 02/13/2022 1642   BUN 7 06/09/2018 1020   CREATININE 1.21 (H) 02/13/2022 1642   CREATININE 0.71 01/14/2017 0845   CALCIUM 9.3 02/13/2022 1642   PROT 7.4 02/13/2022 1642   PROT 7.4 06/09/2018 1020   ALBUMIN 4.2 02/13/2022 1642   ALBUMIN 4.6 06/09/2018 1020   AST 22 02/13/2022 1642  ALT 18 02/13/2022 1642   ALKPHOS 48 02/13/2022 1642   BILITOT 0.4 02/13/2022 1642   BILITOT <0.2 06/09/2018 1020   GFRNONAA 53 (L) 02/13/2022 1642   GFRAA 121 06/09/2018 1020   Mg: 2.1  Assessment & Plan by Problem: Active Problems:   Symptomatic anemia  Shari Gibson is a 57 yo female with asthma, hypertension, obesity, previous history of tobacco use, questionable previous TIA, and uterine fibroids who presented with generalized weakness and is admitted for symptomatic anemia.  Symptomatic anemia Uterine fibroids Perimenopause Hb at discharge two weeks ago was 12.2. At her PCP's office exactly one week later, her Hb had dropped to 8.3 and today, it is down to 7.2. She has had a 5 point drop in her Hb in the last two weeks. Symptomatic anemia is secondary to her heavy menstrual cycle since starting DAPT. Of note, patient has a history of uterine fibroids, however, she has not had heavy menstrual cycles like this before. Patient is also perimenopausal and her cycles are irregular. Additionally, hemoccult test negative in the ED and patient has no s/s of GI bleeding at this time.  - Transfuse 2u PRBCs - Follow up post-transfusion H&H - Protonix 40 mg daily - Hold DAPT for now - Likely will need to follow up  with OBGYN as an outpatient  Hypokalemia K 2.9 on admission, down from 3.6 two weeks ago. Hypokalemia is likely secondary to HCTZ and limited po intake. Patient received Kclor 40 mEq once and 10 mEq of Kcl via IV in the ED. - One more dose of Kclor 40 mEq tonight - Daily BMP - Hold home HCTZ for now  Hypertension Patient is on valsartan-HCTZ 160-25 mg daily at home. BP has been stable, with systolic in the 847U in the ED. Will hold home HCTZ as noted above and will replace valsartan with irbesartan due to hospital formulary.  - Irbesartan 150 mg daily  Previous TIA, questionable Patient presented during her last admission with brief dysarthria and perioral numbness that was thought to be secondary to a possible TIA, although food allergy could not be excluded. CT head, CTA head and neck, and MRI brain were all unremarkable at that time, however, patient was started on DAPT with aspirin and plavix x 3 weeks, followed by aspirin alone (DAPT to be continued through 02/18/22) per stroke team's recommendations.  - Resumed home Crestor 20 mg daily - Hold DAPT as patient is actively bleeding  Asthma Patient is breathing comfortably and satting at 100% on room air. Resumed home albuterol  q6h prn.   Best practices: Code: Full VTE: SCDs Diet: Regular  Dispo: Admit patient to Observation with expected length of stay less than 2 midnights.  SignedDorethea Clan, DO 02/13/2022, 9:08 PM  Pager: [phone] After 5pm on weekdays and 1pm on weekends: On Call pager: [phone]

## 2022-02-13 NOTE — ED Triage Notes (Signed)
Pt bib [facility] from [facility], pt has had vaginal bleeding for 2 weeks, takes plavix and aspirin for recent stroke. Hemoglobin 7.2.

## 2022-02-13 NOTE — ED Provider Notes (Signed)
[place] EMERGENCY DEPARTMENT Provider Note   CSN: [id number] Arrival date & time: 02/13/22  1628     History  Chief Complaint  Patient presents with   Fatigue    Shari Gibson is a 56 y.o. female presenting to the emerged department with generalized weakness and dizziness.  She reports this has been ongoing for 3 weeks.  States she has had vaginal bleeding for 3 weeks.  She is peri-menopausal and has irregular periods but this is much longer than normal.  She said her doctor's office told her to stop aspirin and Plavix 1 week ago, which she has been started on the week prior, because of ongoing bleeding.  She reports he continues to have intermittent bleeding, at times soaking more than 1 pad an hour, at times not soaking at all.  Today she had only very light clotting but is not actively bleeding.  She does report lightheadedness, fatigue, having no energy.  External medical chart review shows that the patient was hospitalized approximately 2 weeks ago for possible TIA versus a food allergy, with some lip swelling and difficulty with speech, as well as ataxia.  She underwent stroke evaluation, including CTA of the head and neck, MRI of the brain, all of which were unremarkable, and a normal echocardiogram.  She was started on dual antiplatelet therapy at the time with aspirin and Plavix for 3 weeks, then recommended aspirin afterwards.  She has continued on these medications.  HPI     Home Medications Prior to Admission medications   Medication Sig Start Date End Date Taking? Authorizing Provider  acetaminophen (TYLENOL) 500 MG tablet Take 500-1,000 mg by mouth every 6 (six) hours as needed for mild pain (muscle aches).    [provider]  albuterol (PROVENTIL HFA;VENTOLIN HFA) 108 (90 Base) MCG/ACT inhaler Inhale 2 puffs into the lungs every 6 (six) hours as needed for wheezing or shortness of breath. 11/18/18   Henson, Vickie L, NP-C  aspirin EC 81 MG tablet  Take 1 tablet (81 mg total) by mouth daily. Swallow whole. 01/29/22 02/28/22  Gaylan Gerold, DO  budesonide-formoterol (SYMBICORT) 80-4.5 MCG/ACT inhaler Inhale 2 puffs into the lungs in the morning and at bedtime. 12/28/21   [provider]  clopidogrel (PLAVIX) 75 MG tablet Take 1 tablet (75 mg total) by mouth daily for 21 days. 01/29/22 02/19/22  Gaylan Gerold, DO  EPINEPHrine 0.3 mg/0.3 mL IJ SOAJ injection Inject 0.3 mg into the muscle as needed for anaphylaxis. 01/28/22   Gaylan Gerold, DO  esomeprazole (NEXIUM) 20 MG capsule Take 20 mg by mouth daily at 12 noon.    [provider]  folic acid (FOLVITE) 1 MG tablet Take 1 tablet (1 mg total) by mouth daily. 05/29/18   Raiford Noble Latif, DO  ibuprofen (ADVIL) 200 MG tablet Take 200 mg by mouth every 6 (six) hours as needed (for pain).    [provider]  iron polysaccharides (NIFEREX) 150 MG capsule Take 1 capsule (150 mg total) by mouth daily. 05/28/18   Raiford Noble Latif, DO  rosuvastatin (CRESTOR) 20 MG tablet Take 1 tablet (20 mg total) by mouth daily. 01/29/22 02/28/22  Gaylan Gerold, DO  valsartan-hydrochlorothiazide (DIOVAN-HCT) 160-25 MG tablet Take 1 tablet by mouth daily. 01/09/22   [provider]      Allergies    Patient has no known allergies.    Review of Systems   Review of Systems  Physical Exam Updated Vital Signs BP 137/70  Pulse 96   Temp 98.2 F (36.8 C) (Oral)   Resp 16   Ht '5\' 7"'$  (1.702 m)   Wt 96.2 kg   SpO2 100%   BMI 33.20 kg/m  Physical Exam Constitutional:      General: She is not in acute distress. HENT:     Head: Normocephalic and atraumatic.  Eyes:     Conjunctiva/sclera: Conjunctivae normal.     Pupils: Pupils are equal, round, and reactive to light.  Cardiovascular:     Rate and Rhythm: Normal rate and regular rhythm.  Pulmonary:     Effort: Pulmonary effort is normal. No respiratory distress.  Abdominal:     General: There is no distension.     Tenderness:  There is no abdominal tenderness.  Genitourinary:    Comments: No gross melena on exam Skin:    General: Skin is warm and dry.  Neurological:     General: No focal deficit present.     Mental Status: She is alert. Mental status is at baseline.  Psychiatric:        Mood and Affect: Mood normal.        Behavior: Behavior normal.     ED Results / Procedures / Treatments   Labs (all labs ordered are listed, but only abnormal results are displayed) Labs Reviewed  CBC WITH DIFFERENTIAL/PLATELET - Abnormal; Notable for the following components:      Result Value   RBC 2.52 (*)    Hemoglobin 7.2 (*)    HCT 22.8 (*)    Platelets 493 (*)    All other components within normal limits  COMPREHENSIVE METABOLIC PANEL - Abnormal; Notable for the following components:   Potassium 2.9 (*)    Glucose, Bld 110 (*)    Creatinine, Ser 1.21 (*)    GFR, Estimated 53 (*)    All other components within normal limits  MAGNESIUM  OCCULT BLOOD X 1 CARD TO LAB, STOOL  POC OCCULT BLOOD, ED  PREPARE RBC (CROSSMATCH)    EKG None  Radiology No results found.  Procedures Procedures    Medications Ordered in ED Medications  potassium chloride 10 mEq in 100 mL IVPB (10 mEq Intravenous New Bag/Given 02/13/22 1757)  0.9 %  sodium chloride infusion (has no administration in time range)  potassium chloride SA (KLOR-CON M) CR tablet 40 mEq (40 mEq Oral Given 02/13/22 1840)    ED Course/ Medical Decision Making/ A&P Clinical Course as of 02/13/22 1842  Fri Feb 13, 2022  1838 Pt accepted for ED to ED transfer by Dr Tomi Bamberger.  Patient consented for blood transfusion [MT]  1839 Spoke to IM resident service who recommended ED to ED transfer to [facility] to initiate transfusions and paging their serivce on her arrival for admission. [MT]    Clinical Course User Index [MT] Lashawna Poche, Carola Rhine, MD                           Medical Decision Making Amount and/or Complexity of Data Reviewed Labs:  ordered. ECG/medicine tests: ordered.  Risk Prescription drug management. Decision regarding hospitalization.   This patient presents to the ED with concern for generalized weakness, dizziness. This involves an extensive number of treatment options, and is a complaint that carries with it a high risk of complications and morbidity.  The differential diagnosis includes symptomatic anemia versus arrhythmia versus dehydration versus electrolyte derangement versus other  Co-morbidities that complicate the patient evaluation: Recent hospitalization  for possible TIA, initiation of anticoagulants which raise risk of bleeding  External records from outside source obtained and reviewed including hospital discharge summary as noted in history above for TIA versus food allergy reaction  I ordered and personally interpreted labs.  The pertinent results include: New acute anemia with hemoglobin of 7.2, down from 12.2 at time of discharge 2 weeks ago.  She is also hypokalemic with potassium of 2.9  Her Hemoccult is negative.  Although I cannot entirely exclude the possibility of a GI bleed, per her history it appears that likely she had abnormal or heavy uterine bleeding in the setting of anticoagulation, which was new and introduced 2 weeks ago.  She is not showing signs of hemorrhage at this time.  However she will need medical admission for blood transfusion given that she is symptomatic.  OB/GYN team could be consulted not emergently once inpatient for recommendations with Ab Ut Bleeding, as she is not having hemorrhage here  She is also hypokalemic which may be multifactorial.  She says she was started on HCTZ about a month ago.  She also reports poor dietary intake the past 2 weeks.  The patient was maintained on a cardiac monitor.  I personally viewed and interpreted the cardiac monitored which showed an underlying rhythm of: NSR  Per my interpretation the patient's ECG shows normal sinus rhythm no  acute ischemic finding  I ordered medication including IV and oral potassium for hypokalemia.  There are no emergent blood products available for transfusion, but she will need transfusion upon arrival in the hospital.  She is agreeable for blood transfusion if needed, and I did recommend transfusion.   I have reviewed the patients home medicines and have made adjustments as needed.  I would recommend discontinuation of HCTZ medication for the hypokalemia.  Will defer final decision to inpatient team will determine her disposition.  Test Considered: Lower suspicion for acute stroke or acute pulmonary embolism given that her anemia is likely the cause of her symptoms, lightheadedness  After the interventions noted above, I reevaluated the patient and found that they have: stayed the same  Dispostion:  After consideration of the diagnostic results and the patients response to treatment, I feel that the patent would benefit from medical admission.         Final Clinical Impression(s) / ED Diagnoses Final diagnoses:  Anemia, unspecified type  Hypokalemia    Rx / DC Orders ED Discharge Orders     None         Semaya Vida, Carola Rhine, MD 02/13/22 [phone]

## 2022-02-13 NOTE — ED Triage Notes (Signed)
Weakness, dizziness for the past two weeks, per client, she tried to return back to work today, her Primary Care referred her to the ED for further evaluation

## 2022-02-13 NOTE — ED Provider Notes (Signed)
Pt seen by Dr Oneta Rack, found to have acute anemia requiring blood transfusion.  Pt transferred from free standing ED for blood transfusion and admission.  Patient reports no acute changes upon transfer.  Patient understands she is getting a blood transfusion and agrees to same.  I spoke with the internal medicine service and notified the patient that she is in the ED.  They will be coming to see the patient to get her admitted for further treatment   Dorie Rank, MD 02/13/22 2037

## 2022-02-13 NOTE — Hospital Course (Signed)
56 yo F 2 wk ago for TIA vs allergies ASA+Plavix  Returning w/ symptomatic anemia Hgb 7.2, 5 point drop  Abnormal uterine bleeding 3 weeks. Much worse with ASA+plavix, d/c by PCP 1 week ago Not unlikely GI bleed  ___________________________________________________  Feeling better today after the blood transfusions. She is not having any more bleeding. Has not yet gone to the bathroom. Feels like her strength is improved now.

## 2022-02-14 ENCOUNTER — Ambulatory Visit: Payer: Self-pay

## 2022-02-14 DIAGNOSIS — N939 Abnormal uterine and vaginal bleeding, unspecified: Secondary | ICD-10-CM

## 2022-02-14 DIAGNOSIS — D500 Iron deficiency anemia secondary to blood loss (chronic): Secondary | ICD-10-CM

## 2022-02-14 DIAGNOSIS — D649 Anemia, unspecified: Secondary | ICD-10-CM | POA: Diagnosis not present

## 2022-02-14 DIAGNOSIS — E876 Hypokalemia: Secondary | ICD-10-CM

## 2022-02-14 DIAGNOSIS — Z8673 Personal history of transient ischemic attack (TIA), and cerebral infarction without residual deficits: Secondary | ICD-10-CM

## 2022-02-14 LAB — IRON AND TIBC
Iron: 74 ug/dL (ref 28–170)
Saturation Ratios: 16 % (ref 10.4–31.8)
TIBC: 456 ug/dL — ABNORMAL HIGH (ref 250–450)
UIBC: 382 ug/dL

## 2022-02-14 LAB — PROTIME-INR
INR: 1 (ref 0.8–1.2)
Prothrombin Time: 13.5 seconds (ref 11.4–15.2)

## 2022-02-14 LAB — CBC
HCT: 30.5 % — ABNORMAL LOW (ref 36.0–46.0)
Hemoglobin: 9.9 g/dL — ABNORMAL LOW (ref 12.0–15.0)
MCH: 29 pg (ref 26.0–34.0)
MCHC: 32.5 g/dL (ref 30.0–36.0)
MCV: 89.4 fL (ref 80.0–100.0)
Platelets: 417 10*3/uL — ABNORMAL HIGH (ref 150–400)
RBC: 3.41 MIL/uL — ABNORMAL LOW (ref 3.87–5.11)
RDW: 14.3 % (ref 11.5–15.5)
WBC: 7 10*3/uL (ref 4.0–10.5)
nRBC: 0 % (ref 0.0–0.2)

## 2022-02-14 LAB — HEMOGLOBIN AND HEMATOCRIT, BLOOD
HCT: 31.1 % — ABNORMAL LOW (ref 36.0–46.0)
Hemoglobin: 10.1 g/dL — ABNORMAL LOW (ref 12.0–15.0)

## 2022-02-14 LAB — BASIC METABOLIC PANEL
Anion gap: 12 (ref 5–15)
BUN: 7 mg/dL (ref 6–20)
CO2: 23 mmol/L (ref 22–32)
Calcium: 9.3 mg/dL (ref 8.9–10.3)
Chloride: 109 mmol/L (ref 98–111)
Creatinine, Ser: 0.81 mg/dL (ref 0.44–1.00)
GFR, Estimated: >60 mL/min (ref >60)
Glucose, Bld: 131 mg/dL — ABNORMAL HIGH (ref 70–99)
Potassium: 3.4 mmol/L — ABNORMAL LOW (ref 3.5–5.1)
Sodium: 144 mmol/L (ref 135–145)

## 2022-02-14 LAB — FERRITIN: Ferritin: 64 ng/mL (ref 11–307)

## 2022-02-14 LAB — GLUCOSE, CAPILLARY: Glucose-Capillary: 111 mg/dL — ABNORMAL HIGH (ref 70–99)

## 2022-02-14 MED ORDER — ASPIRIN 81 MG PO TBEC
81.0000 mg | DELAYED_RELEASE_TABLET | Freq: Every day | ORAL | Status: DC
  Administered 2022-02-14 – 2022-02-15 (×2): 81 mg via ORAL
  Filled 2022-02-14 (×2): qty 1

## 2022-02-14 NOTE — Progress Notes (Signed)
New Admission Note:  Arrival Method: By stretcher from ED at this time Mental Orientation: Alert and oriented x 4  Telemetry: None Assessment: Completed Skin: Completed, refer to flowsheets IV: Left forearm Pain: Denies Tubes: None Safety Measures: Safety Fall Prevention Plan was given, discussed and signed. Admission: Completed [facility] Orientation: Patient has been orientated to the room, unit and the staff. Family: None  Orders have been reviewed and implemented. Will continue to monitor the patient. Call light has been placed within reach and bed alarm has been activated.   Fara Olden, RN  Phone Number: [phone]

## 2022-02-14 NOTE — Progress Notes (Signed)
Hospital day#0 Subjective:   Overnight Events: received 2u of pRBC with improvement of lightheadedness   Patient feeling better this am. Able to ambulate and hold conversations. Denied pain or discomfort, bleeding, chest pain, lightheadedness, presyncopal symptoms.   Objective:  Vital signs in last 24 hours: Vitals:   02/14/22 0123 02/14/22 0138 02/14/22 0521 02/14/22 1000  BP: 114/64 110/62 101/63 112/67  Pulse: 64 62 80 78  Resp: '14 14  16  '$ Temp: 97.9 F (36.6 C) 98 F (36.7 C) 98.3 F (36.8 C) 98.4 F (36.9 C)  TempSrc: Oral Oral Oral Oral  SpO2: 100% 100% 100% 100%  Weight:      Height:       Supplemental O2: Room Air SpO2: 100 %   Physical Exam:  Constitutional: well appearing woman sitting comfortably in bed, in not acute distress Cardiovascular: regular rate and rhythm, no m/r/g. Bilateral, symmetrical 2+ upper and lower extremity pulses Pulmonary/Chest: normal work of breathing on room air, lungs clear to auscultation bilaterally Abdominal: soft, non-tender, non-distended MSK: no deformities Neurological: alert & oriented x 3 Skin: warm and dry Psych: normal mood and affect  Filed Weights   02/13/22 1639  Weight: 96.2 kg     Intake/Output Summary (Last 24 hours) at 02/14/2022 1550 Last data filed at 02/14/2022 1500 Gross per 24 hour  Intake 1858 ml  Output 0 ml  Net 1858 ml   Net IO Since Admission: 1,858 mL [02/14/22 1550]  Pertinent Labs:    Latest Ref Rng & Units 02/14/2022    8:05 AM 02/13/2022    4:42 PM 01/27/2022   11:02 AM  CBC  WBC 4.0 - 10.5 K/uL 7.0  8.0    Hemoglobin 12.0 - 15.0 g/dL 12.0 - 15.0 g/dL 9.9    10.1  7.2  12.6   Hematocrit 36.0 - 46.0 % 36.0 - 46.0 % 30.5    31.1  22.8  37.0   Platelets 150 - 400 K/uL 417  493         Latest Ref Rng & Units 02/14/2022    8:05 AM 02/13/2022    4:42 PM 01/27/2022   11:02 AM  CMP  Glucose 70 - 99 mg/dL 131  110  122   BUN 6 - 20 mg/dL '7  14  15   '$ Creatinine 0.44 - 1.00 mg/dL 0.81   1.21  0.70   Sodium 135 - 145 mmol/L 144  138  138   Potassium 3.5 - 5.1 mmol/L 3.4  2.9  3.6   Chloride 98 - 111 mmol/L 109  105  100   CO2 22 - 32 mmol/L 23  24    Calcium 8.9 - 10.3 mg/dL 9.3  9.3    Total Protein 6.5 - 8.1 g/dL  7.4    Total Bilirubin 0.3 - 1.2 mg/dL  0.4    Alkaline Phos 38 - 126 U/L  48    AST 15 - 41 U/L  22    ALT 0 - 44 U/L  18      Imaging: No results found.  Assessment/Plan:   Active Problems:   Symptomatic anemia   Anemia   Hypokalemia   History of TIA (transient ischemic attack)   Abnormal uterine bleeding   Iron deficiency anemia due to chronic blood loss   Patient Summary: Shari Gibson is a 56 y.o. with a pertinent PMH of HTN, HLD, prior tobacco use, a recent admission for TIA( vs allergies) discharged on DAPT c/b  menorrhagia, presenting with dizziness and admitted with symptomatic anemia  Symptomatic anemia Menorrhagia  Likely in the setting of restating DAPT (ASA '81mg'$  and Plavix 75 mg daily) and heavy menstrual bleeding - Current Hbg 10.1 (from 7.2 on admission), clinically stable and improving - S/p 2pRBC unit transfusion - Will continue to monitor H/H today as we restart ASA - Recommend outpatient Iron studies and follow up - Recommend outpatient OB follow up to manage/monitor known uterine fibroids   ?TIA vs allergies Unclear diagnosis, however, TIA guidelines suggest 10-21 day DAPT therapy. We will start ASA 81 mg today and monitor for signs of bleeding, H/H lab changes. - Re-start ASA 81 mg daily on 02/14/2022 - Discharge Plavix 75 mg  Hypokalemia Likely in the setting of HCTZ and poor PO intake Currently 3.4 from 2.9 on admission, improving. - Continue to hold HCTZ - Replace electrolytes as needed  HTN - Sytolic readings stable [id number] - Currently holding home valsartan-HCTZ - Started Irbesartan 150 mg - Will reassess the need for HCTZ upon discharge or at PCP follow up  Asthma - Continue home albuterol  inhaler  Diet: Regular VTE: SCDs Code: Full   Dispo: Anticipated discharge to Home in 1 days pending clinical improvement.   Romana Juniper, MD Internal Medicine Resident PGY-1 Pager: [phone]  Please contact the on call pager after 5 pm and on weekends at [phone].

## 2022-02-15 LAB — TYPE AND SCREEN
ABO/RH(D): B POS
Antibody Screen: NEGATIVE
Unit division: 0
Unit division: 0

## 2022-02-15 LAB — BASIC METABOLIC PANEL
Anion gap: 10 (ref 5–15)
BUN: 9 mg/dL (ref 6–20)
CO2: 23 mmol/L (ref 22–32)
Calcium: 9 mg/dL (ref 8.9–10.3)
Chloride: 107 mmol/L (ref 98–111)
Creatinine, Ser: 0.82 mg/dL (ref 0.44–1.00)
GFR, Estimated: >60 mL/min (ref >60)
Glucose, Bld: 116 mg/dL — ABNORMAL HIGH (ref 70–99)
Potassium: 3.8 mmol/L (ref 3.5–5.1)
Sodium: 140 mmol/L (ref 135–145)

## 2022-02-15 LAB — BPAM RBC
Blood Product Expiration Date: 202307212359
Blood Product Expiration Date: 202307262359
ISSUE DATE / TIME: 202306302120
ISSUE DATE / TIME: 202307010118
Unit Type and Rh: 7300
Unit Type and Rh: 7300

## 2022-02-15 LAB — CBC
HCT: 30.8 % — ABNORMAL LOW (ref 36.0–46.0)
Hemoglobin: 9.8 g/dL — ABNORMAL LOW (ref 12.0–15.0)
MCH: 28.8 pg (ref 26.0–34.0)
MCHC: 31.8 g/dL (ref 30.0–36.0)
MCV: 90.6 fL (ref 80.0–100.0)
Platelets: 404 10*3/uL — ABNORMAL HIGH (ref 150–400)
RBC: 3.4 MIL/uL — ABNORMAL LOW (ref 3.87–5.11)
RDW: 14.4 % (ref 11.5–15.5)
WBC: 7.2 10*3/uL (ref 4.0–10.5)
nRBC: 0 % (ref 0.0–0.2)

## 2022-02-15 MED ORDER — VALSARTAN 160 MG PO TABS: 160.0000 mg | ORAL_TABLET | Freq: Every day | ORAL | 1 refills | Status: DC

## 2022-02-15 NOTE — Plan of Care (Signed)

## 2022-02-15 NOTE — Progress Notes (Signed)
DISCHARGE NOTE HOME GENAE STRINE to be discharged Home per MD order. Discussed prescriptions and follow up appointments with the patient. Prescriptions given to patient; medication list explained in detail. Patient verbalized understanding.  Skin clean, dry and intact without evidence of skin break down, no evidence of skin tears noted. IV catheter discontinued intact. Site without signs and symptoms of complications. Dressing and pressure applied. Pt denies pain at the site currently. No complaints noted.  Patient free of lines, drains, and wounds.   An After Visit Summary (AVS) was printed and given to the patient. Patient escorted via wheelchair, and discharged home via private auto.  Vira Agar, RN

## 2022-02-15 NOTE — Discharge Summary (Signed)
Name: Shari Gibson MRN: [id number] DOB: 20-May-1966 56 y.o. PCP: [name], MD  Date of Admission: 02/13/2022  5:19 PM Date of Discharge: 02/15/2022 Attending Physician: Lottie Mussel, MD  Discharge Diagnosis: Symptomatic Anemia 2/2 AUB IDA 2/2 chronic blood loss Hx of TIA Hypokalemia   Discharge Medications: Allergies as of 02/15/2022   No Known Allergies      Medication List     STOP taking these medications    clopidogrel 75 MG tablet Commonly known as: PLAVIX   ibuprofen 200 MG tablet Commonly known as: ADVIL   valsartan-hydrochlorothiazide 160-25 MG tablet Commonly known as: DIOVAN-HCT       TAKE these medications    acetaminophen 500 MG tablet Commonly known as: TYLENOL Take 1,000 mg by mouth every 6 (six) hours as needed for mild pain (muscle aches).   albuterol 108 (90 Base) MCG/ACT inhaler Commonly known as: VENTOLIN HFA Inhale 2 puffs into the lungs every 6 (six) hours as needed for wheezing or shortness of breath.   Aspirin Low Dose 81 MG tablet Generic drug: aspirin EC Take 1 tablet (81 mg total) by mouth daily. Swallow whole.   budesonide-formoterol 80-4.5 MCG/ACT inhaler Commonly known as: SYMBICORT Inhale 2 puffs into the lungs in the morning and at bedtime.   EPINEPHrine 0.3 mg/0.3 mL Soaj injection Commonly known as: EPI-PEN Inject 0.3 mg into the muscle as needed for anaphylaxis.   esomeprazole 20 MG capsule Commonly known as: NEXIUM Take 20 mg by mouth daily at 12 noon.   folic acid 1 MG tablet Commonly known as: FOLVITE Take 1 tablet (1 mg total) by mouth daily.   iron polysaccharides 150 MG capsule Commonly known as: NIFEREX Take 1 capsule (150 mg total) by mouth daily.   rosuvastatin 20 MG tablet Commonly known as: CRESTOR Take 1 tablet (20 mg total) by mouth daily. What changed: when to take this   valsartan 160 MG tablet Commonly known as: Diovan Take 1 tablet (160 mg total) by mouth daily.         Disposition and follow-up:   Shari Gibson was discharged from [facility] in Stable condition.  At the hospital follow up visit please address:  1.  Symptomatic Anemia 2/2 AUB. History of recent TIA. Heavy menses after starting DAPT for recent questionable TIA. Received 2u pRBCs during admission. Restarted ASA alone and no further bleeding noted, Hgb stable. Patient to schedule OBGYN f/u in 2 weeks for f/u of heavy menses and history of uterine fibroids. Will also schedule primary care f/u in 1 week. Will discontinue plavix at discharge and continue with ASA monotherapy.  Iron Deficiency Anemia 2/2 chronic blood loss. Iron studies showing iron 74 / sats 16% / ferritin 64, but this was after 2u pRBCs. Repeat iron studies in 1 month and provide supplementation if needed.  Hypokalemia. Was noted to be hypokalemic, responded to repletion. She is on valsartan-HCTZ at home for HTN. Stopped HCTZ and her BP remained well controlled on ARB monotherapy. Will stop HCTZ at discharge. F/u with PCP in 1 week.  2.  Labs / imaging needed at time of follow-up: CBC, BMP, iron studies in 1 month  3.  Pending labs/ test needing follow-up: none  Follow-up Appointments:  Follow-up Information     [name], MD. Schedule an appointment as soon as possible for a visit in 1 week(s).   Specialty: Family Medicine Contact information: [place]  [phone]               -  patient to schedule f/u OBGYN appt in 2 weeks -patient to schedule f/u PCP visit with Dr. Lindell Noe in 1 week  Hospital Course by problem list: 1. Symptomatic Anemia. Hx of IDA 2/2 chronic blood loss.  Patient with history of uterine fibroids and recent questionable TIA presented with weakness/fatigue and intermittent dizziness in the setting of heavy and prolonged menstrual bleeding while on DAPT. On admission, it was noted that her Hgb had dropped by 5  points over a 2-week period. DAPT was held and she received 2u pRBCs with improvement in her symptoms, Hgb improved from 7.2 > 10.1. She remained hemodynamically stable with stable Hgb levels during admission. She was restarted on ASA monotherapy and has not noted any further bleeding. We will discontinue plavix going forward. We discussed follow up with her OBGYN in 2 weeks, which she states she will schedule. Also discussed outpatient follow up with her PCP, Dr. Lindell Noe, in 1 week which she will schedule as well.  2. Hx of TIA Patient was recently admitted and found to have a questionable TIA. She was evaluated by neurology, who recommended DAPT x3 weeks then ASA alone. She has completed about 2 weeks of DAPT prior to this admission. Literature review shows that DAPT can be continued for 10-21 days following TIA, so given that patient had increased/prolonged menstrual bleeding on DAPT precipitating symptomatic anemia, will stop plavix therapy at discharge and continue ASA monotherapy which she is tolerating well.  3. Hypokalemia Was noted to be hypokalemic, responded to repletion. She is on valsartan-HCTZ at home for HTN. Stopped HCTZ and her BP remained well controlled on ARB monotherapy. Will stop HCTZ at discharge. F/u with PCP in 1 week.  Discharge Subjective: Shari Gibson was evaluated at bedside this AM. She reports feeling very well and is ready to go home. She was able to walk around her room yesterday without any symptoms of dizziness/lightheaded. She feels back to her baseline and denies any further bleeding even after restarting aspirin yesterday. She will schedule a follow up visit with her OBGYN in 2 weeks and a follow up visit with her PCP, Dr. Lindell Noe, in 1 week.  Discharge Exam:   BP 122/74 (BP Location: Left Arm)   Pulse 67   Temp 97.6 F (36.4 C) (Oral)   Resp 18   Ht '5\' 7"'$  (1.702 m)   Wt 96.2 kg   SpO2 100%   BMI 33.20 kg/m  Discharge exam:  General: well appearing  middle aged female, sitting comfortably in bed, NAD. CV: normal rate and regular rhythm, no m/r/g. 2+ pulses bilaterally. Pulm: CTABL, no adventitious sounds noted. Normal WOB on RA. Abdomen: soft, nontender, nondistended, normoactive BS. Neuro: AAOx3, no focal deficits noted. Strength appropriate and symmetric bilaterally. Skin: warm and dry. Psych: normal mood and affect.  Pertinent Labs, Studies, and Procedures:     Latest Ref Rng & Units 02/15/2022    1:45 AM 02/14/2022    8:05 AM 02/13/2022    4:42 PM  CBC  WBC 4.0 - 10.5 K/uL 7.2  7.0  8.0   Hemoglobin 12.0 - 15.0 g/dL 9.8  9.9    10.1  7.2   Hematocrit 36.0 - 46.0 % 30.8  30.5    31.1  22.8   Platelets 150 - 400 K/uL 404  417  493    PT/INR - 13.5 / 1.0     Latest Ref Rng & Units 02/15/2022    1:45 AM 02/14/2022    8:05 AM 02/13/2022  4:42 PM  BMP  Glucose 70 - 99 mg/dL 116  131  110   BUN 6 - 20 mg/dL '9  7  14   '$ Creatinine 0.44 - 1.00 mg/dL 0.82  0.81  1.21   Sodium 135 - 145 mmol/L 140  144  138   Potassium 3.5 - 5.1 mmol/L 3.8  3.4  2.9   Chloride 98 - 111 mmol/L 107  109  105   CO2 22 - 32 mmol/L '23  23  24   '$ Calcium 8.9 - 10.3 mg/dL 9.0  9.3  9.3     Iron/TIBC/Ferritin/ %Sat    Component Value Date/Time   IRON 74 02/14/2022 0805   IRON 20 (L) 06/09/2018 1020   TIBC 456 (H) 02/14/2022 0805   TIBC 436 06/09/2018 1020   FERRITIN 64 02/14/2022 0805   FERRITIN 13 (L) 06/09/2018 1020   IRONPCTSAT 16 02/14/2022 0805   IRONPCTSAT 5 (LL) 06/09/2018 1020     Discharge Instructions: Discharge Instructions     Call MD for:  persistant dizziness or light-headedness   Complete by: As directed    Diet - low sodium heart healthy   Complete by: As directed    Discharge instructions   Complete by: As directed    Ms. Brandy, you came in with lightheadedness, dizziness, and feeling tired because your blood counts had gone down from heavy flow. You got some blood while here and I am glad to see that you are feeling  better. Please take note of the following:  1. STOP taking plavix. Continue baby aspirin once a day. 2. We are changing your blood pressure medicine because of your potassium level. Please STOP taking your valsartan-HCTZ. I have prescribed Valsartan alone for you take to help with your blood pressure. You can pick this prescription up from the [facility] on [address]. 3. Please make sure to schedule a visit with your OBGYN in 2 weeks like we discussed. 4. Please make sure to schedule a visit with your primary care doctor, Dr. Lindell Noe, in 1 week like we discussed.   Increase activity slowly   Complete by: As directed        Signed: Virl Axe, MD 02/15/2022, 10:57 AM   Pager: [phone]

## 2022-03-17 NOTE — Progress Notes (Deleted)
[facility] [address] [place]. [place] [address] [phone]       HOSPITAL FOLLOW UP NOTE  [name] Date of Birth:  04-11-1966 Medical Record Number:  [id number]   Reason for Referral:  hospital stroke follow up    SUBJECTIVE:   CHIEF COMPLAINT:  No chief complaint on file.   HPI:    Shari Gibson is a 56 year old female with history of asthma and hypertension who presented on 01/27/2022 with mouth numbness and tingling while at work eating breakfast with EMS noting some intermittent dysarthria and ataxia.  CT no acute finding.  Personally reviewed hospitalization pertinent progress notes, lab work and imaging.  CT head and neck unremarkable.  MRI no acute infarct.  EF 65 to 70%.  LDL 75, A1c 5.0.  UDS negative.  Patient neurologically intact on exam without residual deficit.  Etiology concerning for TIA vs food allergy.  Recommended aspirin and Plavix for 3 weeks and aspirin alone as well as Crestor 20 mg daily for stroke prevention measures.  She was also provided EpiPen and recommended outpatient allergy referral to evaluate for possible new food allergy.  Evaluated by therapies without therapy needs and discharged home.       PERTINENT IMAGING/LABS  Per hospitalization 01/27/2022 CT head negative  CTA Head/Neck: No LVO , No hemodynamically significant stenosis or evidence of dissection MRI head : Normal  2D Echo  : LVEF 65-70%, left and right atrial size is normal . No atrial level shunt detected  LDL -75 HgbA1c -  5.0 %    ROS:   14 system review of systems performed and negative with exception of ***  PMH:  Past Medical History:  Diagnosis Date   Anemia    Anemia 01/14/2017   Asthma    Fibroids    GERD (gastroesophageal reflux disease)    Hypertension    Stroke (HCC)     PSH:  Past Surgical History:  Procedure Laterality Date   TUBAL LIGATION      Social History:  Social History   Socioeconomic History    Marital status: Single    Spouse name: Not on file   Number of children: Not on file   Years of education: Not on file   Highest education level: Not on file  Occupational History   Not on file  Tobacco Use   Smoking status: Former    Packs/day: 1.00    Years: 20.00    Total pack years: 20.00    Types: Cigarettes    Quit date: 03/18/2015    Years since quitting: 7.0    Passive exposure: Never   Smokeless tobacco: Never  Vaping Use   Vaping Use: Never used  Substance and Sexual Activity   Alcohol use: Not Currently    Alcohol/week: 0.0 standard drinks of alcohol    Comment: nothing in 2-3 months    Drug use: No   Sexual activity: Not on file  Other Topics Concern   Not on file  Social History Narrative   Not on file   Social Determinants of Health   Financial Resource Strain: Not on file  Food Insecurity: Not on file  Transportation Needs: Not on file  Physical Activity: Not on file  Stress: Not on file  Social Connections: Not on file  Intimate Partner Violence: Not on file    Family History:  Family History  Adopted: Yes  Problem Relation Age of Onset   Diabetes Mother  Hypertension Mother    Alzheimer's disease Maternal Grandmother    Diabetes Maternal Aunt    Hypertension Maternal Aunt    Asthma Daughter    Lung disease Neg Hx     Medications:   Current Outpatient Medications on File Prior to Visit  Medication Sig Dispense Refill   acetaminophen (TYLENOL) 500 MG tablet Take 1,000 mg by mouth every 6 (six) hours as needed for mild pain (muscle aches).     albuterol (PROVENTIL HFA;VENTOLIN HFA) 108 (90 Base) MCG/ACT inhaler Inhale 2 puffs into the lungs every 6 (six) hours as needed for wheezing or shortness of breath. 1 Inhaler 1   budesonide-formoterol (SYMBICORT) 80-4.5 MCG/ACT inhaler Inhale 2 puffs into the lungs in the morning and at bedtime.     EPINEPHrine 0.3 mg/0.3 mL IJ SOAJ injection Inject 0.3 mg into the muscle as needed for anaphylaxis.  (Patient not taking: Reported on 02/13/2022) 1 each 0   esomeprazole (NEXIUM) 20 MG capsule Take 20 mg by mouth daily at 12 noon.     folic acid (FOLVITE) 1 MG tablet Take 1 tablet (1 mg total) by mouth daily. 30 tablet 0   iron polysaccharides (NIFEREX) 150 MG capsule Take 1 capsule (150 mg total) by mouth daily. 30 capsule 0   rosuvastatin (CRESTOR) 20 MG tablet Take 1 tablet (20 mg total) by mouth daily. (Patient taking differently: Take 20 mg by mouth every evening.) 30 tablet 0   valsartan (DIOVAN) 160 MG tablet Take 1 tablet (160 mg total) by mouth daily. 30 tablet 1   No current facility-administered medications on file prior to visit.    Allergies:  No Known Allergies    OBJECTIVE:  Physical Exam  There were no vitals filed for this visit. There is no height or weight on file to calculate BMI. No results found.     01/14/2017    9:01 AM  Depression screen PHQ 2/9  Decreased Interest 0  Down, Depressed, Hopeless 0  PHQ - 2 Score 0     General: well developed, well nourished, seated, in no evident distress Head: head normocephalic and atraumatic.   Neck: supple with no carotid or supraclavicular bruits Cardiovascular: regular rate and rhythm, no murmurs Musculoskeletal: no deformity Skin:  no rash/petichiae Vascular:  Normal pulses all extremities   Neurologic Exam Mental Status: Awake and fully alert. Oriented to place and time. Recent and remote memory intact. Attention span, concentration and fund of knowledge appropriate. Mood and affect appropriate.  Cranial Nerves: Fundoscopic exam reveals sharp disc margins. Pupils equal, briskly reactive to light. Extraocular movements full without nystagmus. Visual fields full to confrontation. Hearing intact. Facial sensation intact. Face, tongue, palate moves normally and symmetrically.  Motor: Normal bulk and tone. Normal strength in all tested extremity muscles Sensory.: intact to touch , pinprick , position and vibratory  sensation.  Coordination: Rapid alternating movements normal in all extremities. Finger-to-nose and heel-to-shin performed accurately bilaterally. Gait and Station: Arises from chair without difficulty. Stance is normal. Gait demonstrates normal stride length and balance with ***. Tandem walk and heel toe ***.  Reflexes: 1+ and symmetric. Toes downgoing.     NIHSS  *** Modified Rankin  ***      ASSESSMENT: Shari Gibson is a 56 y.o. year old female with possible TIA vs food allergy on 01/27/2022 after presenting with brief dysarthria, perioral numbness, abnormal sensation in throat and witnessed apnea with onset while eating breakfast. Vascular risk factors include HTN, obesity, HLD and history of  tobacco and EtOH use.      PLAN:  Possible TIA :  Continue aspirin 81 mg daily  and Crestor '20mg'$  daily  for secondary stroke prevention.   Discussed secondary stroke prevention measures and importance of close PCP follow up for aggressive stroke risk factor management including BP goal<130/90, HLD with LDL goal<70 and DM with A1c.<7 .  Stroke labs 01/2022: LDL 75, A1c 7.0 I have gone over the pathophysiology of stroke, warning signs and symptoms, risk factors and their management in some detail with instructions to go to the closest emergency room for symptoms of concern.     Follow up in *** or call earlier if needed   CC:  [facility] provider: Dr. Leonie Man PCP: [name], MD    I spent *** minutes of face-to-face and non-face-to-face time with patient.  This included previsit chart review including review of recent hospitalization, lab review, study review, order entry, electronic health record documentation, patient education regarding recent stroke including etiology, secondary stroke prevention measures and importance of managing stroke risk factors, residual deficits and typical recovery time and answered all other questions to patient satisfaction   Frann Rider,  AGNP-BC  [facility] [address] [place], [place] [address]  Phone [phone] Fax [phone] Note: This document was prepared with digital dictation and possible smart phrase technology. Any transcriptional errors that result from this process are unintentional.

## 2022-03-18 ENCOUNTER — Inpatient Hospital Stay: Payer: Managed Care, Other (non HMO) | Admitting: Adult Health

## 2022-03-31 ENCOUNTER — Encounter: Payer: Self-pay | Admitting: Physician Assistant

## 2022-03-31 ENCOUNTER — Ambulatory Visit (HOSPITAL_COMMUNITY): Payer: Self-pay

## 2022-04-01 ENCOUNTER — Other Ambulatory Visit: Payer: Self-pay | Admitting: Obstetrics and Gynecology

## 2022-04-09 ENCOUNTER — Other Ambulatory Visit: Payer: Self-pay | Admitting: Student

## 2022-04-28 ENCOUNTER — Encounter: Payer: Self-pay | Admitting: Physician Assistant

## 2022-04-28 ENCOUNTER — Ambulatory Visit: Payer: Managed Care, Other (non HMO) | Admitting: Physician Assistant

## 2022-04-28 ENCOUNTER — Other Ambulatory Visit (INDEPENDENT_AMBULATORY_CARE_PROVIDER_SITE_OTHER): Payer: Managed Care, Other (non HMO)

## 2022-04-28 VITALS — BP 112/74 | HR 91 | Ht 67.0 in | Wt 210.2 lb

## 2022-04-28 DIAGNOSIS — K219 Gastro-esophageal reflux disease without esophagitis: Secondary | ICD-10-CM | POA: Diagnosis not present

## 2022-04-28 DIAGNOSIS — D649 Anemia, unspecified: Secondary | ICD-10-CM

## 2022-04-28 DIAGNOSIS — R140 Abdominal distension (gaseous): Secondary | ICD-10-CM

## 2022-04-28 DIAGNOSIS — R12 Heartburn: Secondary | ICD-10-CM

## 2022-04-28 DIAGNOSIS — R142 Eructation: Secondary | ICD-10-CM

## 2022-04-28 DIAGNOSIS — R198 Other specified symptoms and signs involving the digestive system and abdomen: Secondary | ICD-10-CM

## 2022-04-28 LAB — CBC WITH DIFFERENTIAL/PLATELET
Basophils Absolute: 0.1 10*3/uL (ref 0.0–0.1)
Basophils Relative: 0.7 % (ref 0.0–3.0)
Eosinophils Absolute: 0 10*3/uL (ref 0.0–0.7)
Eosinophils Relative: 0.5 % (ref 0.0–5.0)
HCT: 32.9 % — ABNORMAL LOW (ref 36.0–46.0)
Hemoglobin: 11.2 g/dL — ABNORMAL LOW (ref 12.0–15.0)
Lymphocytes Relative: 29.1 % (ref 12.0–46.0)
Lymphs Abs: 2.3 10*3/uL (ref 0.7–4.0)
MCHC: 34 g/dL (ref 30.0–36.0)
MCV: 90.1 fl (ref 78.0–100.0)
Monocytes Absolute: 0.4 10*3/uL (ref 0.1–1.0)
Monocytes Relative: 4.9 % (ref 3.0–12.0)
Neutro Abs: 5.1 10*3/uL (ref 1.4–7.7)
Neutrophils Relative %: 64.8 % (ref 43.0–77.0)
Platelets: 277 10*3/uL (ref 150.0–400.0)
RBC: 3.65 Mil/uL — ABNORMAL LOW (ref 3.87–5.11)
RDW: 15 % (ref 11.5–15.5)
WBC: 7.8 10*3/uL (ref 4.0–10.5)

## 2022-04-28 NOTE — Patient Instructions (Addendum)
If you are age 55 or younger, your body mass index should be between 19-25. Your Body mass index is 32.93 kg/m. If this is out of the aformentioned range listed, please consider follow up with your Primary Care Provider.   __________________________________________________________  The [facility] providers would like to encourage you to use [facility] to communicate with providers for non-urgent requests or questions.  Due to long hold times on the telephone, sending your provider a message by [facility] may be a faster and more efficient way to get a response.  Please allow 48 business hours for a response.  Please remember that this is for non-urgent requests.   Due to recent changes in healthcare laws, you may see the results of your imaging and laboratory studies on MyChart before your provider has had a chance to review them.  We understand that in some cases there may be results that are confusing or concerning to you. Not all laboratory results come back in the same time frame and the provider may be waiting for multiple results in order to interpret others.  Please give [place] 48 hours in order for your provider to thoroughly review all the results before contacting the office for clarification of your results.   Your provider has requested that you go to the basement level for lab work before leaving today. Press "B" on the elevator. The lab is located at the first door on the left as you exit the elevator.   You have been scheduled for an endoscopy. Please follow written instructions given to you at your visit today. If you use inhalers (even only as needed), please bring them with you on the day of your procedure.   Increase Nexium 20 MG twice daily.  Thank you for choosing me and [facility].  Edward Jolly, PA - C

## 2022-04-28 NOTE — Progress Notes (Signed)
Subjective:    Patient ID: Shari Gibson, female    DOB: July 30, 1966, 56 y.o.   MRN: [id number]  HPI Shari Gibson is a 56 year old African-American female, new to GI today referred by Dr. Kennith Gain Desai/pulmonary for further evaluation of complaints of shortness of breath which she seems to notice more with eating, and lying down especially after eating. 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 She relates having prior colonoscopy done per Dr. Juanita Craver within the past 3 years.  She says this was negative/no polyps.  She has not had prior EGD. She does have history of GERD and has been on Nexium 20 mg daily which in the past had worked fairly well.  Over the past couple of months she has noticed increased belching and burping and a sensation of "air" in her chest which she is unable to get out.  She has had some regurgitation episodes with food up into her nose.  No dysphagia or odynophagia.  She has noticed the increased sense of shortness of breath with eating and again lying down especially after eating.  She says she has been using her inhaler but does not seem to have much effect on the symptoms.  No chest pain but endorses some heartburn indigestion and fullness feeling as well as some mild early satiety.  No complaints of abdominal pain, no weight loss. She is planned for hysterectomy and BSO in November 2024.  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  Patient says she is to follow-up with pulmonary after GI evaluation to have the pulmonary function testing done.  Review of Systems. Pertinent positive and negative review of systems were noted in the above HPI section.  All other review of systems was  otherwise negative.   Outpatient Encounter Medications as of 04/28/2022  Medication Sig   acetaminophen (TYLENOL) 500 MG tablet Take 1,000 mg by mouth every 6 (six) hours as needed for mild pain (muscle aches).   albuterol (PROVENTIL HFA;VENTOLIN HFA) 108 (90 Base) MCG/ACT inhaler Inhale 2 puffs into the lungs every 6 (six) hours as needed for wheezing or shortness of breath.   budesonide-formoterol (SYMBICORT) 80-4.5 MCG/ACT inhaler Inhale 2 puffs into the lungs in the morning and at bedtime.   esomeprazole (NEXIUM) 20 MG capsule Take 20 mg by mouth daily at 12 noon.   iron polysaccharides (NIFEREX) 150 MG capsule Take 1 capsule (150 mg total) by mouth daily.   valsartan (DIOVAN) 160 MG tablet Take 1 tablet (160 mg total) by mouth daily.   EPINEPHrine 0.3 mg/0.3 mL IJ SOAJ injection Inject 0.3 mg into the muscle as needed for anaphylaxis. (Patient not taking: Reported on 9/52/8413)   folic acid (FOLVITE) 1 MG tablet Take 1 tablet (1 mg total) by mouth daily. (Patient not taking: Reported on 04/28/2022)   rosuvastatin (CRESTOR) 20 MG tablet Take 1 tablet (20 mg total) by mouth daily. (Patient taking differently: Take 20 mg by mouth every evening.)   No facility-administered encounter medications on file as of 04/28/2022.   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   Gastroesophageal reflux disease 01/14/2017   Anxiety 01/14/2017   Alcohol abuse, daily use 01/14/2017   Asthma 01/13/2017   Social History   Socioeconomic History   Marital status: Single    Spouse name:  Not on file   Number of children: 3   Years of education: Not on file   Highest education level: Not on file  Occupational History   Not on file  Tobacco Use   Smoking status: Former    Packs/day: 1.00    Years: 20.00    Total pack years: 20.00    Types: Cigarettes    Quit date: 03/18/2015    Years since quitting: 7.1    Passive exposure: Never   Smokeless tobacco: Never  Vaping Use   Vaping Use: Never used  Substance and Sexual Activity   Alcohol use: Not Currently    Alcohol/week: 0.0 standard drinks of alcohol    Comment: nothing in 2-3 months    Drug use: No   Sexual activity: Not on file  Other Topics Concern   Not on file  Social History Narrative   Not on file   Social Determinants of Health   Financial Resource Strain: Not on file  Food Insecurity: Not on file  Transportation Needs: Not on file  Physical Activity: Not on file  Stress: Not on file  Social Connections: Not on file  Intimate Partner Violence: Not on file    Ms. Kazanjian's family history includes Alzheimer's disease in her maternal grandmother; Asthma in her daughter; Diabetes in her maternal aunt and mother; Hypertension in her maternal aunt and mother. She was adopted.      Objective:    Vitals:   04/28/22 0828  BP: 112/74  Pulse: 91    Physical Exam Well-developed well-nourished older African-American female in no acute distress.  Height, Weight, 210 BMI 32.9  HEENT; nontraumatic normocephalic, EOMI, PE R LA, sclera anicteric. Oropharynx; not examined today Neck; supple, no JVD Cardiovascular; regular rate and rhythm with S1-S2, no murmur rub or gallop Pulmonary; Clear bilaterally Abdomen; soft, nontender, nondistended, no palpable mass or hepatosplenomegaly, bowel sounds are active Rectal; not done today Skin; benign exam, no jaundice rash or appreciable lesions Extremities; no clubbing cyanosis or edema skin warm and dry Neuro/Psych; alert and oriented x4, grossly nonfocal mood  and affect appropriate        Assessment & Plan:   #66  56 year old African-American female with history of GERD, controlled in the past with low-dose Nexium 20 mg daily, now with 2 to 4-monthhistory of noting a sense of shortness of breath with eating, and fullness in her chest and a sense is if she has air in her chest that she is unable to evacuate.  She is also having mild shortness of breath with lying down particularly after eating. Also endorses some heartburn, indigestion and mild early satiety, no dysphagia.  Recent initial consultation with pulmonary who did not feel that the dyspnea symptoms were consistent with a primary pulmonary pathology though pulmonary function tests are pending.  Rule out secondary to poorly controlled GERD, esophagitis, Rule out secondary to asthma/COPD  #2 history of uterine fibroids with menorrhagia and iron deficiency anemia.  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 Collene Mares   Plan; start antireflux diet and strict antireflux regimen to include n.p.o. for 3 hours prior to bedtime, elevation of the back 45 degrees for sleep, remaining upright post meals  Increase Nexium to 20 mg p.o. twice daily [facility]  Patient will be scheduled for EGD with Dr. Havery Moros late September 2023.  ( she will be longer than 3 months out from TIA at that point) Procedure was discussed in detail with the patient including indications risk and benefits and she is agreeable to proceed Repeat CBC today to document stable hemoglobin Patient needs to keep follow-up with pulmonary and proceed with pulmonary function testing. Further recommendation pending findings at EGD, and response to changes in regimen.  Cearra Portnoy Genia Harold PA-C 04/28/2022   Cc: Spero Geralds, MD

## 2022-04-29 NOTE — Progress Notes (Signed)
Agree with assessment and plan as outlined.  

## 2022-05-11 ENCOUNTER — Ambulatory Visit: Payer: Managed Care, Other (non HMO) | Admitting: Internal Medicine

## 2022-05-11 ENCOUNTER — Encounter: Payer: Self-pay | Admitting: Internal Medicine

## 2022-05-11 ENCOUNTER — Ambulatory Visit (INDEPENDENT_AMBULATORY_CARE_PROVIDER_SITE_OTHER): Payer: Managed Care, Other (non HMO) | Admitting: Internal Medicine

## 2022-05-11 VITALS — BP 136/72 | HR 70 | Temp 97.9°F | Ht 67.0 in | Wt 215.0 lb

## 2022-05-11 DIAGNOSIS — J4520 Mild intermittent asthma, uncomplicated: Secondary | ICD-10-CM

## 2022-05-11 LAB — PULMONARY FUNCTION TEST
DL/VA % pred: 104 %
DL/VA: 4.37 ml/min/mmHg/L
DLCO cor % pred: 87 %
DLCO cor: 19.83 ml/min/mmHg
DLCO unc % pred: 80 %
DLCO unc: 18.35 ml/min/mmHg
FEF 25-75 Post: 1.69 L/sec
FEF 25-75 Pre: 1.67 L/sec
FEF2575-%Change-Post: 1 %
FEF2575-%Pred-Post: 61 %
FEF2575-%Pred-Pre: 60 %
FEV1-%Change-Post: 2 %
FEV1-%Pred-Post: 72 %
FEV1-%Pred-Pre: 70 %
FEV1-Post: 2.15 L
FEV1-Pre: 2.1 L
FEV1FVC-%Change-Post: 6 %
FEV1FVC-%Pred-Pre: 93 %
FEV6-%Change-Post: -3 %
FEV6-%Pred-Post: 74 %
FEV6-%Pred-Pre: 77 %
FEV6-Post: 2.74 L
FEV6-Pre: 2.84 L
FEV6FVC-%Pred-Post: 103 %
FEV6FVC-%Pred-Pre: 103 %
FVC-%Change-Post: -3 %
FVC-%Pred-Post: 71 %
FVC-%Pred-Pre: 74 %
FVC-Post: 2.74 L
FVC-Pre: 2.84 L
Post FEV1/FVC ratio: 78 %
Post FEV6/FVC ratio: 100 %
Pre FEV1/FVC ratio: 74 %
Pre FEV6/FVC Ratio: 100 %
RV % pred: 146 %
RV: 2.97 L
TLC % pred: 104 %
TLC: 5.73 L

## 2022-05-11 NOTE — Patient Instructions (Signed)
Full PFT performed today. °

## 2022-05-11 NOTE — Progress Notes (Signed)
Shari Gibson    [id number]    Jul 14, 1966  Primary Care Physician:Timberlake, Anastasia Pall, MD Date of Appointment: 05/11/2022 Established Patient Visit  Chief complaint:   Chief Complaint  Patient presents with   Follow-up    PFT f/u      HPI: Shari Gibson  is a 56 y.o. woman with mild intermittent asthma.  Interval Updates: Here for follow up after PFTs - show normal pulmonary function. Scheduled to have EGD next month to follow up on symptoms of abdominal fullness. She says symptoms of discomfort after eating and with laying down seem to have improved somewhat since I saw her last. No issues with asthma. Minimal albuterol use.   I have reviewed the patient's family social and past medical history and updated as appropriate.   Past Medical History:  Diagnosis Date   Anemia 01/14/2017   Asthma    Fibroids    GERD (gastroesophageal reflux disease)    Herpes    Hypertension    Stroke [facility])     Past Surgical History:  Procedure Laterality Date   TUBAL LIGATION      Family History  Adopted: Yes  Problem Relation Age of Onset   Diabetes Mother    Hypertension Mother    Alzheimer's disease Maternal Grandmother    Diabetes Maternal Aunt    Hypertension Maternal Aunt    Asthma Daughter    Lung disease Neg Hx     Social History   Occupational History   Not on file  Tobacco Use   Smoking status: Former    Packs/day: 1.00    Years: 20.00    Total pack years: 20.00    Types: Cigarettes    Quit date: 03/18/2015    Years since quitting: 7.1    Passive exposure: Never   Smokeless tobacco: Never  Vaping Use   Vaping Use: Never used  Substance and Sexual Activity   Alcohol use: Not Currently    Alcohol/week: 0.0 standard drinks of alcohol    Comment: nothing in 2-3 months    Drug use: No   Sexual activity: Not on file     Physical Exam: Blood pressure 136/72, pulse 70, temperature 97.9 F (36.6 C), temperature source Oral, height 5'  7" (1.702 m), weight 215 lb (97.5 kg), SpO2 99 %.  Gen:      No acute distress ENT:  no nasal polyps, mucus membranes moist Lungs:    No increased respiratory effort, symmetric chest wall excursion, clear to auscultation bilaterally, no wheezes or crackles CV:         Regular rate and rhythm; no murmurs, rubs, or gallops.  No pedal edema   Data Reviewed: Imaging: I have personally reviewed the   PFTs:      No data to display         I have personally reviewed the patient's PFTs 04/2022 and they show normal pulmonary function  Labs:  Immunization status: Immunization History  Administered Date(s) Administered   Tdap 01/14/2017    External Records Personally Reviewed: GI records  Assessment:  Mild intermittent asthma  Plan/Recommendations: Follow up with the GI doctors as we discussed.  Lung function was totally normal!  Continue albuterol as needed. I am happy to see you sooner if asthma symptoms worsen or change, or you have additional questions/concerns.   Return to Care: Return in about 1 year (around 05/12/2023).   Lenice Llamas, MD Pulmonary and Critical Care  [place]

## 2022-05-11 NOTE — Progress Notes (Signed)
Full PFT performed today. °

## 2022-05-11 NOTE — Patient Instructions (Signed)
Please schedule follow up scheduled with myself in 1 year.  If my schedule is not open yet, we will contact you with a reminder closer to that time. Please call [phone] if you haven't heard from [place] a month before.   Follow up with the GI doctors as we discussed.  Lung function was totally normal!  Continue albuterol as needed. I am happy to see you sooner if asthma symptoms worsen or change, or you have additional questions/concerns.

## 2022-05-19 ENCOUNTER — Ambulatory Visit: Payer: Managed Care, Other (non HMO) | Admitting: Gastroenterology

## 2022-05-19 ENCOUNTER — Telehealth: Payer: Self-pay | Admitting: Gastroenterology

## 2022-05-19 VITALS — BP 125/65 | HR 73 | Temp 98.4°F | Resp 18 | Ht 67.0 in | Wt 210.0 lb

## 2022-05-19 DIAGNOSIS — K449 Diaphragmatic hernia without obstruction or gangrene: Secondary | ICD-10-CM

## 2022-05-19 DIAGNOSIS — Z8719 Personal history of other diseases of the digestive system: Secondary | ICD-10-CM

## 2022-05-19 DIAGNOSIS — D509 Iron deficiency anemia, unspecified: Secondary | ICD-10-CM

## 2022-05-19 DIAGNOSIS — K317 Polyp of stomach and duodenum: Secondary | ICD-10-CM

## 2022-05-19 DIAGNOSIS — R12 Heartburn: Secondary | ICD-10-CM

## 2022-05-19 DIAGNOSIS — K3189 Other diseases of stomach and duodenum: Secondary | ICD-10-CM | POA: Diagnosis not present

## 2022-05-19 DIAGNOSIS — K219 Gastro-esophageal reflux disease without esophagitis: Secondary | ICD-10-CM

## 2022-05-19 HISTORY — DX: Personal history of other diseases of the digestive system: Z87.19

## 2022-05-19 MED ORDER — SODIUM CHLORIDE 0.9 % IV SOLN: 500.0000 mL | Freq: Once | INTRAVENOUS | Status: DC

## 2022-05-19 NOTE — Progress Notes (Signed)
Called to room to assist during endoscopic procedure.  Patient ID and intended procedure confirmed with present staff. Received instructions for my participation in the procedure from the performing physician.  

## 2022-05-19 NOTE — Telephone Encounter (Signed)
Colonoscopy arrived as outlined for patient's record.  Colonoscopy done 02-28-2020:  Per Dr. Juanita Craver.  Normal colon with good prep.  Repeat in 10 years.

## 2022-05-19 NOTE — Progress Notes (Signed)
Pt's states no medical or surgical changes since previsit or office visit. 

## 2022-05-19 NOTE — Progress Notes (Signed)
History and Physical Interval Note: See on 04/28/22 - no interval changes. HIstory of suspected GERD, also with mild asthma. Now on nexium '20mg'$  BID. HIstory of IDA with negative colonoscopy, thought to be related to menorrhagia but no prior EGD. EGD to evaluate suspected GERD and history of IDA   05/19/2022 1:39 PM  Shari Gibson  has presented today for endoscopic procedure(s), with the diagnosis of  Encounter Diagnoses  Name Primary?   Gastroesophageal reflux disease without esophagitis Yes   Iron deficiency anemia, unspecified iron deficiency anemia type   .  The various methods of evaluation and treatment have been discussed with the patient and/or family. After consideration of risks, benefits and other options for treatment, the patient has consented to  the endoscopic procedure(s).   The patient's history has been reviewed, patient examined, no change in status, stable for surgery.  I have reviewed the patient's chart and labs.  Questions were answered to the patient's satisfaction.    Jolly Mango, MD [facility]'

## 2022-05-19 NOTE — Progress Notes (Signed)
Report to PACU, RN, vss, BBS= Clear.  

## 2022-05-19 NOTE — Op Note (Signed)
[place] Patient Name: Shari Gibson Procedure Date: 05/19/2022 1:40 PM MRN: [id number] Endoscopist: Remo Lipps P. Havery Moros , MD Age: 56 Referring MD:  Date of Birth: [birth year] Gender: Female Account #: [id number] Procedure:                Upper GI endoscopy Indications:              history iron deficiency anemia (thought to be due                            to menorrhagia - colonoscopy normal), suspected                            gastro-esophageal reflux disease - improved on                            nexium '20mg'$  twice daily in recent weeks Medicines:                Monitored Anesthesia Care Procedure:                Pre-Anesthesia Assessment:                           - Prior to the procedure, a History and Physical                            was performed, and patient medications and                            allergies were reviewed. The patient's tolerance of                            previous anesthesia was also reviewed. The risks                            and benefits of the procedure and the sedation                            options and risks were discussed with the patient.                            All questions were answered, and informed consent                            was obtained. Prior Anticoagulants: The patient has                            taken no previous anticoagulant or antiplatelet                            agents. ASA Grade Assessment: II - A patient with                            mild systemic disease. After reviewing the risks  and benefits, the patient was deemed in                            satisfactory condition to undergo the procedure.                           After obtaining informed consent, the endoscope was                            passed under direct vision. Throughout the                            procedure, the patient's blood pressure, pulse, and                            oxygen  saturations were monitored continuously. The                            GIF [id number] #[id number] was introduced through the                            mouth, and advanced to the second part of duodenum.                            The upper GI endoscopy was accomplished without                            difficulty. The patient tolerated the procedure                            well. Scope In: Scope Out: Findings:                 Esophagogastric landmarks were identified: the                            Z-line was found at 37 cm, the gastroesophageal                            junction was found at 37 cm and the upper extent of                            the gastric folds was found at 38 cm from the                            incisors.                           A 1 cm hiatal hernia was present.                           The exam of the esophagus was otherwise normal. No                            erosive changes  noted.                           A single 4 mm sessile polyp was found in the                            gastric body, grossly consistent with a benign                            fundic gland polyp. The polyp was removed with a                            cold biopsy forceps to ensure no adenomatous                            change. Resection and retrieval were complete.                           The exam of the stomach was otherwise normal.                           Biopsies were taken with a cold forceps in the                            gastric body, at the incisura and in the gastric                            antrum for Helicobacter pylori testing given                            history of iron deficiency.                           The examined duodenum was normal. Complications:            No immediate complications. Estimated blood loss:                            Minimal. Estimated Blood Loss:     Estimated blood loss was minimal. Impression:               - Esophagogastric  landmarks identified.                           - 1 cm hiatal hernia.                           - Normal esophagus otherwise                           - A single gastric polyp. Resected and retrieved.                           - Normal stomach otherwise - biopsies taken to rule  out H pylori given history of iron deficiency                           - Normal examined duodenum. Recommendation:           - Patient has a contact number available for                            emergencies. The signs and symptoms of potential                            delayed complications were discussed with the                            patient. Return to normal activities tomorrow.                            Written discharge instructions were provided to the                            patient.                           - Resume previous diet.                           - Continue present medications.                           - Continue nexium twice daily for another few weeks                            if this is helping. Long term use the lowest dose                            of nexium needed to control symptoms                           - Await pathology results. Remo Lipps P. Brelee Renk, MD 05/19/2022 1:59:46 PM This report has been signed electronically.

## 2022-05-19 NOTE — Patient Instructions (Signed)
Handout on gastritis and hiatal hernia given to patient.  Await pathology results. Resume previous diet and continue present medications Continue Nexium twice daily for another few weeks if this is helping. Long term - use the lowest dose of Nexium needed to control symptoms.  YOU HAD AN ENDOSCOPIC PROCEDURE TODAY AT [facility]:   Refer to the procedure report that was given to you for any specific questions about what was found during the examination.  If the procedure report does not answer your questions, please call your gastroenterologist to clarify.  If you requested that your care partner not be given the details of your procedure findings, then the procedure report has been included in a sealed envelope for you to review at your convenience later.  YOU SHOULD EXPECT: Some feelings of bloating in the abdomen. Passage of more gas than usual.  Walking can help get rid of the air that was put into your GI tract during the procedure and reduce the bloating. If you had a lower endoscopy (such as a colonoscopy or flexible sigmoidoscopy) you may notice spotting of blood in your stool or on the toilet paper. If you underwent a bowel prep for your procedure, you may not have a normal bowel movement for a few days.  Please Note:  You might notice some irritation and congestion in your nose or some drainage.  This is from the oxygen used during your procedure.  There is no need for concern and it should clear up in a day or so.  SYMPTOMS TO REPORT IMMEDIATELY:  Following upper endoscopy (EGD)  Vomiting of blood or coffee ground material  New chest pain or pain under the shoulder blades  Painful or persistently difficult swallowing  New shortness of breath  Fever of 100F or higher  Black, tarry-looking stools  For urgent or emergent issues, a gastroenterologist can be reached at any hour by calling [phone]. Do not use MyChart messaging for urgent concerns.    DIET:  We  do recommend a small meal at first, but then you may proceed to your regular diet.  Drink plenty of fluids but you should avoid alcoholic beverages for 24 hours.  ACTIVITY:  You should plan to take it easy for the rest of today and you should NOT DRIVE or use heavy machinery until tomorrow (because of the sedation medicines used during the test).    FOLLOW UP: Our staff will call the number listed on your records the next business day following your procedure.  We will call around 7:15- 8:00 am to check on you and address any questions or concerns that you may have regarding the information given to you following your procedure. If we do not reach you, we will leave a message.     If any biopsies were taken you will be contacted by phone or by letter within the next 1-3 weeks.  Please call us at [phone] if you have not heard about the biopsies in 3 weeks.    SIGNATURES/CONFIDENTIALITY: You and/or your care partner have signed paperwork which will be entered into your electronic medical record.  These signatures attest to the fact that that the information above on your After Visit Summary has been reviewed and is understood.  Full responsibility of the confidentiality of this discharge information lies with you and/or your care-partner.

## 2022-05-20 ENCOUNTER — Telehealth: Payer: Self-pay

## 2022-05-20 NOTE — Progress Notes (Unsigned)
[facility] [address] [place]. [place] [address] [phone]       HOSPITAL FOLLOW UP NOTE  [name] Date of Birth:  10-Aug-1966 Medical Record Number:  [id number]   Reason for Referral:  hospital stroke follow up    SUBJECTIVE:   CHIEF COMPLAINT:  No chief complaint on file.   HPI:   Shari Gibson is a 56 year old female with history of asthma and hypertension who presented on 01/27/2022 with ataxia, slurred speech, abnormal sensation in throat and perioral numbness and tingling while eating breakfast.  CT no acute finding.  CT head and neck unremarkable.  MRI no acute infarct.  EF 65 to 70%.  LDL 75, A1c 5.0.  UDS negative.  Evaluated by Dr. Erlinda Hong, etiology for symptoms concerning for TIA vs food allergy.  Placed on aspirin and Plavix for 3 weeks and aspirin alone as well as initiated Crestor.  She is also prescribed EpiPen for possible food allergy benefit from outpatient allergist referral.  No residual deficits, no therapy needs.        PERTINENT IMAGING  Per hospitalization 01/27/2022 CT head negative  CTA Head/Neck: No LVO , No hemodynamically significant stenosis or evidence of dissection MRI head : Normal  2D Echo  : LVEF 65-70%, left and right atrial size is normal . No atrial level shunt detected  LDL -75 HgbA1c -  5.0 %    ROS:   14 system review of systems performed and negative with exception of ***  PMH:  Past Medical History:  Diagnosis Date   Anemia 01/14/2017   Asthma    Fibroids    GERD (gastroesophageal reflux disease)    Herpes    Hypertension    Stroke [facility])     PSH:  Past Surgical History:  Procedure Laterality Date   COLONOSCOPY  02/28/2020   [place]. Dr. Juanita Craver. The entire portion of the colon appeared normal.   TUBAL LIGATION     UPPER GASTROINTESTINAL ENDOSCOPY      Social History:  Social History   Socioeconomic History   Marital status: Single    Spouse  name: Not on file   Number of children: 3   Years of education: Not on file   Highest education level: Not on file  Occupational History   Not on file  Tobacco Use   Smoking status: Former    Packs/day: 1.00    Years: 20.00    Total pack years: 20.00    Types: Cigarettes    Quit date: 03/18/2015    Years since quitting: 7.1    Passive exposure: Never   Smokeless tobacco: Never  Vaping Use   Vaping Use: Never used  Substance and Sexual Activity   Alcohol use: Not Currently    Alcohol/week: 0.0 standard drinks of alcohol    Comment: nothing in 2-3 months    Drug use: No   Sexual activity: Not on file  Other Topics Concern   Not on file  Social History Narrative   Not on file   Social Determinants of Health   Financial Resource Strain: Not on file  Food Insecurity: Not on file  Transportation Needs: Not on file  Physical Activity: Not on file  Stress: Not on file  Social Connections: Not on file  Intimate Partner Violence: Not on file    Family History:  Family History  Adopted: Yes  Problem Relation Age of Onset   Diabetes Mother    Hypertension  Mother    Alzheimer's disease Maternal Grandmother    Diabetes Maternal Aunt    Hypertension Maternal Aunt    Asthma Daughter    Lung disease Neg Hx     Medications:   Current Outpatient Medications on File Prior to Visit  Medication Sig Dispense Refill   acetaminophen (TYLENOL) 500 MG tablet Take 1,000 mg by mouth every 6 (six) hours as needed for mild pain (muscle aches).     albuterol (PROVENTIL HFA;VENTOLIN HFA) 108 (90 Base) MCG/ACT inhaler Inhale 2 puffs into the lungs every 6 (six) hours as needed for wheezing or shortness of breath. 1 Inhaler 1   budesonide-formoterol (SYMBICORT) 80-4.5 MCG/ACT inhaler Inhale 2 puffs into the lungs in the morning and at bedtime.     EPINEPHrine 0.3 mg/0.3 mL IJ SOAJ injection Inject 0.3 mg into the muscle as needed for anaphylaxis. 1 each 0   esomeprazole (NEXIUM) 20 MG capsule  Take 20 mg by mouth daily at 12 noon.     folic acid (FOLVITE) 1 MG tablet Take 1 tablet (1 mg total) by mouth daily. 30 tablet 0   iron polysaccharides (NIFEREX) 150 MG capsule Take 1 capsule (150 mg total) by mouth daily. 30 capsule 0   rosuvastatin (CRESTOR) 20 MG tablet Take 1 tablet (20 mg total) by mouth daily. (Patient taking differently: Take 20 mg by mouth every evening.) 30 tablet 0   valsartan (DIOVAN) 160 MG tablet Take 1 tablet (160 mg total) by mouth daily. 30 tablet 1   No current facility-administered medications on file prior to visit.    Allergies:  No Known Allergies    OBJECTIVE:  Physical Exam  There were no vitals filed for this visit. There is no height or weight on file to calculate BMI. No results found.     01/14/2017    9:01 AM  Depression screen PHQ 2/9  Decreased Interest 0  Down, Depressed, Hopeless 0  PHQ - 2 Score 0     General: well developed, well nourished, seated, in no evident distress Head: head normocephalic and atraumatic.   Neck: supple with no carotid or supraclavicular bruits Cardiovascular: regular rate and rhythm, no murmurs Musculoskeletal: no deformity Skin:  no rash/petichiae Vascular:  Normal pulses all extremities   Neurologic Exam Mental Status: Awake and fully alert. Oriented to place and time. Recent and remote memory intact. Attention span, concentration and fund of knowledge appropriate. Mood and affect appropriate.  Cranial Nerves: Fundoscopic exam reveals sharp disc margins. Pupils equal, briskly reactive to light. Extraocular movements full without nystagmus. Visual fields full to confrontation. Hearing intact. Facial sensation intact. Face, tongue, palate moves normally and symmetrically.  Motor: Normal bulk and tone. Normal strength in all tested extremity muscles Sensory.: intact to touch , pinprick , position and vibratory sensation.  Coordination: Rapid alternating movements normal in all extremities.  Finger-to-nose and heel-to-shin performed accurately bilaterally. Gait and Station: Arises from chair without difficulty. Stance is normal. Gait demonstrates normal stride length and balance with ***. Tandem walk and heel toe ***.  Reflexes: 1+ and symmetric. Toes downgoing.     NIHSS  *** Modified Rankin  ***      ASSESSMENT: FIANA GLADU is a 56 y.o. year old female with possible TIA vs food allergy on 01/27/2022. Vascular risk factors include HLD.      PLAN:  *** :  Residual deficit: ***.  Continue {anticoagluation:28091} and {statin:28096} for secondary stroke prevention.   Discussed secondary stroke prevention measures and importance  of close PCP follow up for aggressive stroke risk factor management including BP goal<130/90, HLD with LDL goal<70 and DM with A1c.<7 .  Stroke labs ***: LDL ***, A1c *** I have gone over the pathophysiology of stroke, warning signs and symptoms, risk factors and their management in some detail with instructions to go to the closest emergency room for symptoms of concern.     Follow up in *** or call earlier if needed   CC:  [facility] provider: Dr. Leonie Man PCP: [name], MD    I spent *** minutes of face-to-face and non-face-to-face time with patient.  This included previsit chart review including review of recent hospitalization, lab review, study review, order entry, electronic health record documentation, patient education regarding recent stroke including etiology, secondary stroke prevention measures and importance of managing stroke risk factors, residual deficits and typical recovery time and answered all other questions to patient satisfaction   Frann Rider, AGNP-BC  [facility] [address] [place],  [address]  Phone [phone] Fax [phone] Note: This document was prepared with digital dictation and possible smart phrase technology. Any transcriptional errors that  result from this process are unintentional.

## 2022-05-20 NOTE — Telephone Encounter (Signed)
  Follow up Call-     05/19/2022   12:47 PM  Call back number  Post procedure Call Back phone  # [phone]  Permission to leave phone message Yes     Post op call attempted, no answer, left WM.

## 2022-05-21 ENCOUNTER — Ambulatory Visit (INDEPENDENT_AMBULATORY_CARE_PROVIDER_SITE_OTHER): Payer: Managed Care, Other (non HMO) | Admitting: Adult Health

## 2022-05-21 ENCOUNTER — Encounter: Payer: Self-pay | Admitting: Adult Health

## 2022-05-21 VITALS — BP 141/73 | HR 74 | Ht 67.0 in | Wt 216.1 lb

## 2022-05-21 DIAGNOSIS — R2990 Unspecified symptoms and signs involving the nervous system: Secondary | ICD-10-CM

## 2022-05-21 DIAGNOSIS — Z09 Encounter for follow-up examination after completed treatment for conditions other than malignant neoplasm: Secondary | ICD-10-CM | POA: Diagnosis not present

## 2022-05-21 NOTE — Patient Instructions (Addendum)
Continue aspirin 81 mg daily  and Crestor  for secondary stroke prevention  Continue to follow up with PCP regarding cholesterol and blood pressure management  Maintain strict control of hypertension with blood pressure goal below 130/90 and cholesterol with LDL cholesterol (bad cholesterol) goal below 70 mg/dL.   Signs of a Stroke? Follow the BEFAST method:  Balance Watch for a sudden loss of balance, trouble with coordination or vertigo Eyes Is there a sudden loss of vision in one or both eyes? Or double vision?  Face: Ask the person to smile. Does one side of the face droop or is it numb?  Arms: Ask the person to raise both arms. Does one arm drift downward? Is there weakness or numbness of a leg? Speech: Ask the person to repeat a simple phrase. Does the speech sound slurred/strange? Is the person confused ? Time: If you observe any of these signs, call 911.       Thank you for coming to see [place] at [facility]. I hope we have been able to provide you high quality care today.  You may receive a patient satisfaction survey over the next few weeks. We would appreciate your feedback and comments so that we may continue to improve ourselves and the health of our patients.     Transient Ischemic Attack A transient ischemic attack (TIA) causes the same symptoms as a stroke, but the symptoms go away quickly. A TIA happens when blood flow to the brain is blocked. Having a TIA means you may be at risk for a stroke. A TIA is a medical emergency. What are the causes? A TIA is caused by a blocked artery in the head or neck. This means the brain does not get the blood supply it needs. A blockage can be caused by: Fatty buildup in an artery in the head or neck. A blood clot. A tear in an artery. Irritation and swelling (inflammation) of an artery. Sometimes the cause is not known. What increases the risk? Certain things may make you more likely to have a TIA. Some of these  are things that you can change, such as: Being very overweight. Using products that have nicotine or tobacco. Taking birth control pills. Not being active. Drinking too much alcohol. Using drugs. Health conditions that may increase your risk include: High blood pressure. High cholesterol. Diabetes. Heart disease. A heartbeat that is not regular (atrial fibrillation). Sickle cell disease. Sleep problems (sleep apnea). Long-term diseases that cause irritation and swelling. Problems with blood clotting. Other risk factors include: Being over the age of 21. Being female. Having a family history of stroke. Having had blood clots, stroke, TIA, or heart attack in the past. Having a history of high blood pressure when pregnant (preeclampsia). Very bad headaches (migraines). What are the signs or symptoms? The symptoms of a TIA are like those of a stroke. They can include: Weakness or loss of feeling in your face, arm, or leg. This often happens on one side of your body. Trouble walking. Trouble moving your arms or legs. Trouble talking or understanding what people are saying. Problems with how you see. Feeling dizzy. Feeling confused. Loss of balance or coordination. Feeling like you may vomit (nausea) or you vomit. Having a very bad headache. If you can, note what time you started to have symptoms. Tell your doctor. How is this treated? The goal of treatment is to lower the risk for a stroke. This may include: Changes to diet and lifestyle,  such as getting regular exercise and stopping smoking. Taking medicines to: Thin the blood. Lower blood pressure. Lower cholesterol. Treating other health conditions, such as diabetes. If testing shows that an artery in your brain is narrow, your doctor may recommend a procedure to: Take the blockage out of your artery (carotid endarterectomy). Open or widen an artery in your neck (carotid angioplasty and stenting). Follow these instructions  at home: Medicines Take over-the-counter and prescription medicines only as told by your doctor. If you were told to take aspirin or another medicine to thin your blood, take it exactly as told by your doctor. Taking too much of the medicine can cause bleeding. Taking too little of the medicine may not work to treat the problem. Eating and drinking  Eat 5 or more servings of fruits and vegetables each day. Follow instructions from your doctor about your diet. You may need to follow a certain diet to help lower your risk of a stroke. You may need to: Eat a diet that is low in fat and salt. Eat foods with a lot of fiber. Limit carbohydrates and sugar. If you drink alcohol: Limit how much you have to: 0-1 drink a day for women who are not pregnant. 0-2 drinks a day for men. Know how much alcohol is in a drink. In the [place] one drink equals one 12 oz bottle of beer (347m), one 5 oz glass of wine (1454m, or one 1 oz glass of hard liquor (4441m General instructions Keep a healthy weight. Try to get at least 30 minutes of exercise on most days. Get treatment if you have sleep problems. Do not smoke or use any products that contain nicotine or tobacco. If you need help quitting, ask your doctor. Do not use drugs. Keep all follow-up visits. Where to find more information American Stroke Association: www.stroke.org Get help right away if: You have chest pain. You have a heartbeat that is not regular. You have any signs of a stroke. "BE FAST" is an easy way to remember the main warning signs: B - Balance. Dizziness, sudden trouble walking, or loss of balance. E - Eyes. Trouble seeing or a change in how you see. F - Face. Sudden weakness or loss of feeling of the face. The face or eyelid may droop on one side. A - Arms. Weakness or loss of feeling in an arm. This happens all of a sudden and most often on one side of the body. S - Speech. Sudden trouble speaking, slurred speech, or trouble  understanding what people say. T - Time. Time to call emergency services. Write down what time symptoms started. You have other signs of a stroke, such as: A sudden, very bad headache with no known cause. Feeling like you may vomit. Vomiting. A seizure. These symptoms may be an emergency. Get help right away. Call your local emergency services (911 in the [place] Do not wait to see if the symptoms will go away. Do not drive yourself to the hospital. Summary A transient ischemic attack (TIA) happens when an artery in the head or neck is blocked. This causes the same symptoms as a stroke. The symptoms go away quickly. A TIA is a medical emergency. Get help right away, even if your symptoms go away. Having a TIA means that you may be at risk for a stroke. Taking medicines and making diet and lifestyle changes can help to prevent a stroke. This information is not intended to replace advice given to you by your  health care provider. Make sure you discuss any questions you have with your health care provider. Document Revised: 12/03/2021 Document Reviewed: 02/27/2020 Elsevier Patient Education  [place].

## 2022-06-24 ENCOUNTER — Encounter (HOSPITAL_BASED_OUTPATIENT_CLINIC_OR_DEPARTMENT_OTHER): Payer: Self-pay | Admitting: Obstetrics and Gynecology

## 2022-06-30 ENCOUNTER — Other Ambulatory Visit: Payer: Self-pay

## 2022-06-30 ENCOUNTER — Encounter (HOSPITAL_BASED_OUTPATIENT_CLINIC_OR_DEPARTMENT_OTHER): Payer: Self-pay | Admitting: Obstetrics and Gynecology

## 2022-06-30 NOTE — Progress Notes (Signed)
Your procedure is scheduled on Tuesday, 07/07/22.  Report to [facility]   Call this number if you have problems the morning of surgery  :[phone].   OUR ADDRESS IS [place].  WE ARE LOCATED IN [facility].  PLEASE BRING YOUR INSURANCE CARD AND PHOTO ID DAY OF SURGERY.  ONLY 2 PEOPLE ARE ALLOWED IN  WAITING  ROOM.                                      REMEMBER:  DO NOT EAT FOOD, CANDY GUM OR MINTS  AFTER MIDNIGHT THE NIGHT BEFORE YOUR SURGERY . YOU MAY HAVE CLEAR LIQUIDS FROM MIDNIGHT THE NIGHT BEFORE YOUR SURGERY UNTIL  10:30 AM. NO CLEAR LIQUIDS AFTER   10:30 AM DAY OF SURGERY.  YOU MAY  BRUSH YOUR TEETH MORNING OF SURGERY AND RINSE YOUR MOUTH OUT, NO CHEWING GUM CANDY OR MINTS.     CLEAR LIQUID DIET   Foods Allowed                                                                     Foods Excluded  Coffee and tea, regular and decaf                             liquids that you cannot  Plain Jell-O                                                                   see through such as: Fruit ices (not with fruit pulp)                                     milk, soups, orange juice  Plain  Popsicles                                    All solid food Carbonated beverages, regular and diet                                    Cranberry, grape and apple juices Sports drinks like Gatorade _____________________________________________________________________     TAKE ONLY THESE MEDICATIONS MORNING OF SURGERY: Nexium, Symbicort, Albuterol inhaler if needed Please bring Albuterol inhaler with you on day of surgery.    UP TO 4 VISITORS  MAY VISIT IN THE EXTENDED RECOVERY ROOM UNTIL 800 PM ONLY.  ONE  VISITOR AGE 55 AND OVER MAY SPEND THE NIGHT AND MUST BE IN EXTENDED RECOVERY ROOM NO LATER THAN 800 PM . YOUR DISCHARGE TIME AFTER YOU SPEND THE NIGHT IS 900 AM THE MORNING AFTER YOUR  SURGERY.  YOU MAY PACK A SMALL OVERNIGHT  BAG WITH TOILETRIES FOR YOUR OVERNIGHT STAY IF YOU WISH.  YOUR PRESCRIPTION MEDICATIONS WILL BE PROVIDED DURING [place].                                      DO NOT WEAR JEWERLY, MAKE UP. DO NOT WEAR LOTIONS, POWDERS, PERFUMES OR NAIL POLISH ON YOUR FINGERNAILS. TOENAIL POLISH IS OK TO WEAR. DO NOT SHAVE FOR 48 HOURS PRIOR TO DAY OF SURGERY. MEN MAY SHAVE FACE AND NECK. CONTACTS, GLASSES, OR DENTURES MAY NOT BE WORN TO SURGERY.  REMEMBER: NO SMOKING, DRUGS OR ALCOHOL FOR 24 HOURS BEFORE YOUR SURGERY.                                    [facility] IS NOT RESPONSIBLE  FOR ANY BELONGINGS.                                                                    Marland Kitchen           [facility] - Preparing for Surgery Before surgery, you can play an important role.  Because skin is not sterile, your skin needs to be as free of germs as possible.  You can reduce the number of germs on your skin by washing with CHG (chlorahexidine gluconate) soap before surgery.  CHG is an antiseptic cleaner which kills germs and bonds with the skin to continue killing germs even after washing. Please DO NOT use if you have an allergy to CHG or antibacterial soaps.  If your skin becomes reddened/irritated stop using the CHG and inform your nurse when you arrive at Short Stay. Do not shave (including legs and underarms) for at least 48 hours prior to the first CHG shower.  You may shave your face/neck. Please follow these instructions carefully:  1.  Shower with CHG Soap the night before surgery and the  morning of Surgery.  2.  If you choose to wash your hair, wash your hair first as usual with your  normal  shampoo.  3.  After you shampoo, rinse your hair and body thoroughly to remove the  shampoo.                                        4.  Use CHG as you would any other liquid soap.  You can apply chg directly  to the skin and wash , chg soap provided, night before and morning of your surgery.  5.  Apply the CHG Soap  to your body ONLY FROM THE NECK DOWN.   Do not use on face/ open                           Wound or open sores. Avoid contact with eyes, ears mouth and genitals (private parts).  Wash face,  Genitals (private parts) with your normal soap.             6.  Wash thoroughly, paying special attention to the area where your surgery  will be performed.  7.  Thoroughly rinse your body with warm water from the neck down.  8.  DO NOT shower/wash with your normal soap after using and rinsing off  the CHG Soap.             9.  Pat yourself dry with a clean towel.            10.  Wear clean pajamas.            11.  Place clean sheets on your bed the night of your first shower and do not  sleep with pets. Day of Surgery : Do not apply any lotions/deodorants the morning of surgery.  Please wear clean clothes to the hospital/surgery center.  IF YOU HAVE ANY SKIN IRRITATION OR PROBLEMS WITH THE SURGICAL SOAP, PLEASE GET A BAR OF GOLD DIAL SOAP AND SHOWER THE NIGHT BEFORE YOUR SURGERY AND THE MORNING OF YOUR SURGERY. PLEASE LET THE NURSE KNOW MORNING OF YOUR SURGERY IF YOU HAD ANY PROBLEMS WITH THE SURGICAL SOAP.   ________________________________________________________________________                                                        QUESTIONS [facility] PRE OP NURSE PHONE [phone].

## 2022-06-30 NOTE — Progress Notes (Signed)
Spoke w/ via phone for pre-op interview---Shari Gibson needs dos----urine pregnancy per anesthesia, sugeon orders pending as of 06/30/22               Gibson results------02/13/22 EKG in chart & Epic COVID test -----patient states asymptomatic no test needed Arrive at -------1130 on Tuesday, 07/07/22 NPO after MN NO Solid Food.  Clear liquids from MN until---1030 Med rec completed Medications to take morning of surgery -----Nexium, Symbicort, Albuterol prn Patient stated that Dr. Landry Mellow instructed her to stop taking the baby aspirin 14 days before surgery. Diabetic medication -----n/a Patient instructed no nail polish to be worn day of surgery Patient instructed to bring photo id and insurance card day of surgery Patient aware to have Driver (ride ) / caregiver    for 24 hours after surgery - daughter Patient Special Instructions -----Extended / overnight stay instructions given. Please bring Albuterol inhaler day of surgery. Pre-Op special Istructions -----Requested orders from Dr. Landry Mellow via [facility] on 06/24/22. Patient verbalized understanding of instructions that were given at this phone interview. Patient denies shortness of breath, chest pain, fever, cough at this phone interview.

## 2022-07-03 ENCOUNTER — Other Ambulatory Visit: Payer: Self-pay | Admitting: Obstetrics and Gynecology

## 2022-07-03 DIAGNOSIS — D251 Intramural leiomyoma of uterus: Secondary | ICD-10-CM

## 2022-07-06 ENCOUNTER — Other Ambulatory Visit: Payer: Self-pay | Admitting: Obstetrics and Gynecology

## 2022-07-06 ENCOUNTER — Encounter (HOSPITAL_COMMUNITY)
Admission: RE | Admit: 2022-07-06 | Discharge: 2022-07-06 | Disposition: A | Discharge status: Home / Self Care | Payer: Managed Care, Other (non HMO) | Source: Ambulatory Visit | Attending: Obstetrics and Gynecology

## 2022-07-06 DIAGNOSIS — Z01812 Encounter for preprocedural laboratory examination: Secondary | ICD-10-CM | POA: Diagnosis present

## 2022-07-06 DIAGNOSIS — Z01818 Encounter for other preprocedural examination: Secondary | ICD-10-CM

## 2022-07-06 DIAGNOSIS — N939 Abnormal uterine and vaginal bleeding, unspecified: Secondary | ICD-10-CM

## 2022-07-06 LAB — CBC
HCT: 24.3 % — ABNORMAL LOW (ref 36.0–46.0)
Hemoglobin: 7.3 g/dL — ABNORMAL LOW (ref 12.0–15.0)
MCH: 26.4 pg (ref 26.0–34.0)
MCHC: 30 g/dL (ref 30.0–36.0)
MCV: 88 fL (ref 80.0–100.0)
Platelets: 345 10*3/uL (ref 150–400)
RBC: 2.76 MIL/uL — ABNORMAL LOW (ref 3.87–5.11)
RDW: 17.2 % — ABNORMAL HIGH (ref 11.5–15.5)
WBC: 5.2 10*3/uL (ref 4.0–10.5)
nRBC: 0 % (ref 0.0–0.2)

## 2022-07-06 LAB — BASIC METABOLIC PANEL
Anion gap: 10 (ref 5–15)
BUN: 9 mg/dL (ref 6–20)
CO2: 23 mmol/L (ref 22–32)
Calcium: 8.4 mg/dL — ABNORMAL LOW (ref 8.9–10.3)
Chloride: 104 mmol/L (ref 98–111)
Creatinine, Ser: 0.63 mg/dL (ref 0.44–1.00)
GFR, Estimated: >60 mL/min (ref >60)
Glucose, Bld: 106 mg/dL — ABNORMAL HIGH (ref 70–99)
Potassium: 3.6 mmol/L (ref 3.5–5.1)
Sodium: 137 mmol/L (ref 135–145)

## 2022-07-06 NOTE — H&P (Deleted)
  The note originally documented on this encounter has been moved the the encounter in which it belongs.  

## 2022-07-06 NOTE — Progress Notes (Signed)
Patient came in for pre-op lab work on 07/06/22. Hgb was 7.2. I spoke with Dr. Daiva Huge, [facility]. Per Dr. Daiva Huge, she will leave it up to Dr. Landry Mellow whether they want to push back the patient's surgery and try to build up her Hgb or transfuse day of surgery. I routed lab results to Dr. Landry Mellow. I called Dr. Sundra Aland office and spoke with Georgina Peer, scheduler and let her know what Dr. Daiva Huge had said. Per Georgina Peer, she will reach out to Dr. Landry Mellow who is not in the office today and then call me back.

## 2022-07-06 NOTE — Progress Notes (Addendum)
Georgina Peer, scheduler at Dr. Sundra Aland office called me back. Per Georgina Peer, Dr. Landry Mellow would like to transfuse 1 unit of blood before surgery on 07/07/22. I spoke with Rolla Flatten, RN, assistant director of [facility]. Per Pam, the patient should come in at 10:00 am to allow time for the blood transfusion. I called and left a voicemail with patient letting her know to be here at 10 am tomorrow. I asked her to call me back so I could verify and go over the details with her.  Addendum: Patient returned my phone call and confirmed that she would be at [facility] at 10:00 am on 07/07/22.

## 2022-07-06 NOTE — H&P (Signed)
Reason for Appointment 1. Preop Visit History of Present Illness General:         56 y/o presents for preop visit. Pt is schedule for a robotic assisted laparoscopic hysterectomy with bilateral salpingo-oophorectomy on 07/07/2022 for the management of fibroids and adnexal mass. Gyn History:         History is signficant for the following . she reports a h/o 2 blood transfusions required in her liftime both thought to be due to heavy uterine bleeding.        Recent episode is as follows.         In January 26, 2022 she left work and had numbness in mouth and throat. she was taking to the ER. she was having difficulty speaking. had a CT scan and MRI.Marland Kitchen A stroke could not be ruled out however she was dx with TIA. she was prescribed blood thinner/ Aspirin ( she is not still on the blood thinner). she started June 11th ( mensees are irregular) she has never gone an entire year without a menses. she has known about fibroids for the last few years. she has been on medication for hypertension. ( valsartan combination). A week after released from the hospital she developed fatigue. she had low hgb. she received a blood transfusion ( 2 units) . she was told to stop the blood thinner and the aspirin.         she bleed form June 11th through the first week of july.        GYN ULTRASOUND FINDINGS ON 03/25/2022:        Anteverted uterus 16.6 cm x 11.1 cm x 10.9 cm        Enlarged uterus with multiple uterine fibroids        1) anterior left 4.7x4.6x4.1        2) Posterior 3.8x3.6x3.3cm        3) Posterior 3.5x3.2x3.2cm        4) Fundal 4.4x4.2x4.1cm        5) Anterior 2.9x2.8x2.2cm        6) Anterior submucosal 2.0x1.8x1.6cm        7) Posterior Left uterus 3.7x3.7x3.0cm        Multiple other fibroids throughout uterus.        Endometrium thickened and irregular in appearance due to fibroids. Endo 0.98cm        Anterior submucosal fibroid appears to push majority into endometrial cavity        Right ov wnl         Left ov not visualized. Left adnexa hypoechoic solid mass 7.0x6.0x5.9cm with some + BF noted around        mass. ? ovarian; Previously seen on MRI 02/20/20. Appears to have increased in size from MRI.  Current Medications Taking Acyclovir 400 MG Tablet 1 tablet Orally once a day Albuterol Sulfate 108 (90 Base) MCG/ACT Aerosol Powder Breath Activated as directed Inhalation every 4 hrs Budesonide-Formoterol Fumarate 80-4.5 MCG/ACT Aerosol 2 puffs Inhalation Twice a day Polysaccharide-Iron Complex 150 MG Capsule 1 capsule Orally Three times a Week Rosuvastatin Calcium 20 MG Tablet 1 tablet Orally Once a day Valsartan 160 MG Tablet 1 tablet Orally Once a day Tylenol(Acetaminophen) 325 MG Capsule 1 capsule as needed Orally every 6 hrs Niferex-150 EPINEPHrine 0.3 MG/0.3ML Solution Prefilled Syringe as directed Injection Esomeprazole Magnesium 20 MG Capsule Delayed Release 1 capsule Orally Once a day Discontinued metroNIDAZOLE 500 MG Tablet 1 tablet Orally Twice a day with food Medication List reviewed and  reconciled with the patient Past Medical History      Esophageal reflux.      Hypercholesterolemia.      Hypertension.      Asthma.      Fibroids. Surgical History       tubal ligation 1991 Family History Father: alive 85 yrs, diagnosed with Diabetes Mother: alive 35 yrs, diagnosed with Breast cancer Brother 1: alive Brother2: alive Brother 96: alive Sister 47: alive Sister 2: alive Sister 66: alive Maternal uncle: deceased, Parkinsons Disease 1 son(s) , 2 daughter(s) - healthy. Social History General:  Tobacco use  cigarettes:  Former smoker, Quit in year  2016, Tobacco history last updated  06/19/2022, Vaping  No. EXPOSURE TO PASSIVE SMOKE: no passive smoking. Alcohol: yes, 2+ per week. Caffeine: yes, occasionally. Recreational drug use: no. DIET: No pork, mostly vegietarian. Exercise: no. Marital Status: single. EDUCATION: High School. OCCUPATION: Gaffer. Gyn  History Sexual activity not currently sexually active.  Periods : irregular.  LMP 05/28/2022.  Denies H/O Birth control.  Last pap smear date 03/06/2022 - NILM.  Last mammogram date 01/2020 - Negative.  Denies H/O Abnormal pap smear.  H/O STD Chlamydia.  OB History Number of pregnancies  4.  abortion  1.  Pregnancy # 1  live birth, vaginal delivery, boy.  Pregnancy # 2  abortion.  Pregnancy # 3  live birth vaginal delivery, girl.  Pregnancy # 4:  live birth, vaginal girl.  Allergies N.K.D.A. Hospitalization/Major Diagnostic Procedure Childbirth x 3 1985, 1987, 1991 Review of Systems CONSTITUTIONAL:         Chills No.  Fatigue No.  Fever No.  Night sweats No.  Recent travel outside [place] No.  Sweats No.  Weight change No.     OPHTHALMOLOGY:         Blurring of vision no.  Change in vision no.  Double vision no.     ENT:         Dizziness no.  Nose bleeds no.  Sore throat no.  Teeth pain no.     ALLERGY:         Hives no.     CARDIOLOGY:         Chest pain no.  High blood pressure no.  Irregular heart beat no.  Leg edema no.  Palpitations no.     RESPIRATORY:         Shortness of breath no.  Cough no.  Wheezing no.     UROLOGY:         Pain with urination no.  Urinary urgency no.  Urinary frequency no.  Urinary incontinence no.  Difficulty urinating No.  Blood in urine No.     GASTROENTEROLOGY:         Abdominal pain no.  Appetite change no.  Bloating/belching no.  Blood in stool or on toilet paper no.  Change in bowel movements no.  Constipation no.  Diarrhea no.  Difficulty swallowing no.  Nausea no.     FEMALE REPRODUCTIVE:         Vulvar pain no.  Vulvar rash no.  Abnormal vaginal bleeding , heavy menses, irregular meses.  Breast pain no.  Nipple discharge no.  Pain with intercourse no.  Pelvic pain no.  Unusual vaginal discharge no.  Vaginal itching no.     MUSCULOSKELETAL:         Muscle aches no.     NEUROLOGY:         Headache no.  Tingling/numbness no.  Weakness no.      PSYCHOLOGY:         Depression no.  Anxiety no.  Nervousness no.  Sleep disturbances no.  Suicidal ideation no .     ENDOCRINOLOGY:         Excessive thirst no.  Excessive urination no.  Hair loss no.  Heat or cold intolerance no.     HEMATOLOGY/LYMPH:         Abnormal bleeding no.  Easy bruising no.  Swollen glands no.     DERMATOLOGY:         New/changing skin lesion no.  Rash no.  Sores no.   Vital Signs Wt: 218.6, Wt change: 3 lbs, Ht: 67, BMI: 34.23, Pulse sitting: 93, BP sitting: 171/106. Examination General Examination:        CONSTITUTIONAL: alert, oriented, NAD. SKIN:  moist, warm. EYES:  Conjunctiva clear. LUNGS: good I:E efffort noted, clear to auscultation bilaterally. HEART: regular rate and rhythm. ABDOMEN: soft, non-tender/non-distended, bowel sounds present. FEMALE GENITOURINARY: normal external genitalia, labia - unremarkable, vagina - pink moist mucosa, no lesions or abnormal discharge, cervix - no discharge or lesions or CMT, adnexa - Left adnexal mass  noted .. tender to palpation... uterus -  16  week size. PSYCH:  affect normal, good eye contact.  Physical Examination Chaperone present:         Chaperone present  Beather Arbour 06/19/2022 09:43:35 AM >, for pelvic exam.      Assessments 1. Abnormal uterine bleeding (AUB) - N93.9 (Primary) 2. Adnexal mass - N94.89 3. Fibroids - D21.9 4. Vaginal discharge - N89.8 Treatment 1. Abnormal uterine bleeding (AUB)  Notes: Notes: r/b/a of robotic assisted laparoscopic hysterectomy with bilateral salpingo-oophorectomy discussed with the patient. including but not limited to infection/ bleeding / damage to bowel bladder ureters as well as conversion to laparotomy with the need for further surgery. pt voiced understanding and would like to proceed with robotic assisted laparoscopic hysterectomy with bilateral salpingectomy.. r/o trasnfusion discussed. HIV/ HEP B and C.. she is advised to avoid NSAIDS between now and surgery. she is  advised not to eat or drink after midninght the night prior to surgery.Marland Kitchen she will not be able to drive for 1 week after surgery. she will need to avoid heavy lifting over 10 lbs and sex for at least 6 weeks after surgery    2. Adnexal mass  Notes: Notes: r/b/a of robotic assisted laparoscopic hysterectomy with bilateral salpingo-oophorectomy discussed with the patient. including but not limited to infection/ bleeding / damage to bowel bladder ureters as well as conversion to laparotomy with the need for further surgery. pt voiced understanding and would like to proceed with robotic assisted laparoscopic hysterectomy with bilateral salpingectomy.. r/o trasnfusion discussed. HIV/ HEP B and C.. she is advised to avoid NSAIDS between now and surgery. she is advised not to eat or drink after midninght the night prior to surgery.Marland Kitchen she will not be able to drive for 1 week after surgery. she will need to avoid heavy lifting over 10 lbs and sex for at least 6 weeks after surgery    3. Fibroids  Notes: Notes: r/b/a of robotic assisted laparoscopic hysterectomy with bilateral salpingo-oophorectomy discussed with the patient. including but not limited to infection/ bleeding / damage to bowel bladder ureters as well as conversion to laparotomy with the need for further surgery. pt voiced understanding and would like to proceed with robotic assisted laparoscopic hysterectomy with bilateral salpingectomy.. r/o trasnfusion discussed. HIV/  HEP B and C.. she is advised to avoid NSAIDS between now and surgery. she is advised not to eat or drink after midninght the night prior to surgery.Marland Kitchen she will not be able to drive for 1 week after surgery. she will need to avoid heavy lifting over 10 lbs and sex for at least 6 weeks after surgery    4. Vaginal discharge       LAB: [facility] (Collection Date & Time - 06/19/2022 09:55 AM) Labs        Lab: [facility] (Collection Date & Time - 06/19/2022 09:55 AM)   Normal                      Value Reference Range      WBCS Normal  Normal -      CLUE CELLS None seen  None seen -      TRICHOMONAS None Seen  None Seen -      YEAST None seen  None seen -      OTHER: RBC's present  -

## 2022-07-07 ENCOUNTER — Encounter (HOSPITAL_BASED_OUTPATIENT_CLINIC_OR_DEPARTMENT_OTHER): Payer: Self-pay | Admitting: Obstetrics and Gynecology

## 2022-07-07 ENCOUNTER — Other Ambulatory Visit: Payer: Self-pay

## 2022-07-07 ENCOUNTER — Observation Stay (HOSPITAL_BASED_OUTPATIENT_CLINIC_OR_DEPARTMENT_OTHER)
Admission: RE | Admit: 2022-07-07 | Discharge: 2022-07-08 | Disposition: A | Discharge status: Home / Self Care | Payer: Managed Care, Other (non HMO) | Source: Ambulatory Visit | Attending: Obstetrics and Gynecology | Admitting: Obstetrics and Gynecology

## 2022-07-07 ENCOUNTER — Ambulatory Visit (HOSPITAL_BASED_OUTPATIENT_CLINIC_OR_DEPARTMENT_OTHER): Payer: Managed Care, Other (non HMO) | Admitting: Anesthesiology

## 2022-07-07 ENCOUNTER — Encounter (HOSPITAL_BASED_OUTPATIENT_CLINIC_OR_DEPARTMENT_OTHER)
Admission: RE | Disposition: A | Discharge status: Home / Self Care | Payer: Self-pay | Source: Ambulatory Visit | Attending: Obstetrics and Gynecology

## 2022-07-07 DIAGNOSIS — D3912 Neoplasm of uncertain behavior of left ovary: Secondary | ICD-10-CM | POA: Diagnosis not present

## 2022-07-07 DIAGNOSIS — D259 Leiomyoma of uterus, unspecified: Secondary | ICD-10-CM

## 2022-07-07 DIAGNOSIS — Z87891 Personal history of nicotine dependence: Secondary | ICD-10-CM | POA: Insufficient documentation

## 2022-07-07 DIAGNOSIS — D251 Intramural leiomyoma of uterus: Secondary | ICD-10-CM | POA: Diagnosis not present

## 2022-07-07 DIAGNOSIS — N838 Other noninflammatory disorders of ovary, fallopian tube and broad ligament: Secondary | ICD-10-CM | POA: Diagnosis not present

## 2022-07-07 DIAGNOSIS — J45909 Unspecified asthma, uncomplicated: Secondary | ICD-10-CM | POA: Diagnosis not present

## 2022-07-07 DIAGNOSIS — N8003 Adenomyosis of the uterus: Secondary | ICD-10-CM | POA: Diagnosis not present

## 2022-07-07 DIAGNOSIS — D270 Benign neoplasm of right ovary: Secondary | ICD-10-CM | POA: Diagnosis not present

## 2022-07-07 DIAGNOSIS — D282 Benign neoplasm of uterine tubes and ligaments: Secondary | ICD-10-CM | POA: Insufficient documentation

## 2022-07-07 DIAGNOSIS — Z01818 Encounter for other preprocedural examination: Secondary | ICD-10-CM

## 2022-07-07 DIAGNOSIS — I10 Essential (primary) hypertension: Secondary | ICD-10-CM | POA: Diagnosis not present

## 2022-07-07 DIAGNOSIS — Z79899 Other long term (current) drug therapy: Secondary | ICD-10-CM | POA: Diagnosis not present

## 2022-07-07 DIAGNOSIS — N9489 Other specified conditions associated with female genital organs and menstrual cycle: Secondary | ICD-10-CM | POA: Insufficient documentation

## 2022-07-07 DIAGNOSIS — N939 Abnormal uterine and vaginal bleeding, unspecified: Secondary | ICD-10-CM

## 2022-07-07 DIAGNOSIS — Z90710 Acquired absence of both cervix and uterus: Secondary | ICD-10-CM | POA: Diagnosis present

## 2022-07-07 DIAGNOSIS — D649 Anemia, unspecified: Secondary | ICD-10-CM

## 2022-07-07 DIAGNOSIS — D219 Benign neoplasm of connective and other soft tissue, unspecified: Secondary | ICD-10-CM | POA: Diagnosis present

## 2022-07-07 HISTORY — DX: Presence of spectacles and contact lenses: Z97.3

## 2022-07-07 HISTORY — PX: ROBOTIC ASSISTED LAPAROSCOPIC HYSTERECTOMY AND SALPINGECTOMY: SHX6379

## 2022-07-07 LAB — CBC
HCT: 25.4 % — ABNORMAL LOW (ref 36.0–46.0)
Hemoglobin: 7.7 g/dL — ABNORMAL LOW (ref 12.0–15.0)
MCH: 26.5 pg (ref 26.0–34.0)
MCHC: 30.3 g/dL (ref 30.0–36.0)
MCV: 87.3 fL (ref 80.0–100.0)
Platelets: 360 10*3/uL (ref 150–400)
RBC: 2.91 MIL/uL — ABNORMAL LOW (ref 3.87–5.11)
RDW: 17.2 % — ABNORMAL HIGH (ref 11.5–15.5)
WBC: 6.4 10*3/uL (ref 4.0–10.5)
nRBC: 0 % (ref 0.0–0.2)

## 2022-07-07 LAB — PREPARE RBC (CROSSMATCH)

## 2022-07-07 LAB — POCT PREGNANCY, URINE: Preg Test, Ur: NEGATIVE

## 2022-07-07 SURGERY — XI ROBOTIC ASSISTED LAPAROSCOPIC HYSTERECTOMY AND SALPINGECTOMY
Anesthesia: General | Site: Abdomen | Laterality: Bilateral

## 2022-07-07 MED ORDER — LACTATED RINGERS IV SOLN: INTRAVENOUS | Status: DC

## 2022-07-07 MED ORDER — ACETAMINOPHEN 500 MG PO TABS
ORAL_TABLET | ORAL | Status: AC
  Filled 2022-07-07: qty 2

## 2022-07-07 MED ORDER — ACETAMINOPHEN 500 MG PO TABS
1000.0000 mg | ORAL_TABLET | Freq: Four times a day (QID) | ORAL | Status: DC
  Administered 2022-07-07 – 2022-07-08 (×3): 1000 mg via ORAL

## 2022-07-07 MED ORDER — MIDAZOLAM HCL 5 MG/5ML IJ SOLN
INTRAMUSCULAR | Status: DC | PRN
  Administered 2022-07-07: 2 mg via INTRAVENOUS

## 2022-07-07 MED ORDER — PANTOPRAZOLE SODIUM 40 MG PO TBEC
40.0000 mg | DELAYED_RELEASE_TABLET | Freq: Every day | ORAL | Status: DC
  Administered 2022-07-07: 40 mg via ORAL

## 2022-07-07 MED ORDER — SODIUM CHLORIDE 0.9 % IV SOLN: 10.0000 mL/h | Freq: Once | INTRAVENOUS | Status: DC

## 2022-07-07 MED ORDER — MOMETASONE FURO-FORMOTEROL FUM 100-5 MCG/ACT IN AERO: 2.0000 | INHALATION_SPRAY | Freq: Two times a day (BID) | RESPIRATORY_TRACT | Status: DC

## 2022-07-07 MED ORDER — ACETAMINOPHEN 500 MG PO TABS
1000.0000 mg | ORAL_TABLET | ORAL | Status: AC
  Administered 2022-07-07: 1000 mg via ORAL

## 2022-07-07 MED ORDER — SODIUM CHLORIDE 0.9 % IV SOLN
2.0000 g | INTRAVENOUS | Status: AC
  Administered 2022-07-07: 2 g via INTRAVENOUS

## 2022-07-07 MED ORDER — ACETAMINOPHEN 500 MG PO TABS
ORAL_TABLET | ORAL | Status: AC
  Filled 2022-07-07: qty 1

## 2022-07-07 MED ORDER — ALUM & MAG HYDROXIDE-SIMETH 200-200-20 MG/5ML PO SUSP: 30.0000 mL | ORAL | Status: DC | PRN

## 2022-07-07 MED ORDER — SODIUM CHLORIDE 0.9 % IR SOLN
Status: DC | PRN
  Administered 2022-07-07: 1000 mL

## 2022-07-07 MED ORDER — KETOROLAC TROMETHAMINE 30 MG/ML IJ SOLN
INTRAMUSCULAR | Status: AC
  Filled 2022-07-07: qty 1

## 2022-07-07 MED ORDER — HEMOSTATIC AGENTS (NO CHARGE) OPTIME
TOPICAL | Status: DC | PRN
  Administered 2022-07-07: 1

## 2022-07-07 MED ORDER — PROPOFOL 10 MG/ML IV BOLUS
INTRAVENOUS | Status: DC | PRN
  Administered 2022-07-07: 160 mg via INTRAVENOUS

## 2022-07-07 MED ORDER — SENNA 8.6 MG PO TABS
ORAL_TABLET | ORAL | Status: AC
  Filled 2022-07-07: qty 1

## 2022-07-07 MED ORDER — GABAPENTIN 300 MG PO CAPS
300.0000 mg | ORAL_CAPSULE | ORAL | Status: AC
  Administered 2022-07-07: 300 mg via ORAL

## 2022-07-07 MED ORDER — DEXAMETHASONE SODIUM PHOSPHATE 10 MG/ML IJ SOLN
INTRAMUSCULAR | Status: DC | PRN
  Administered 2022-07-07: 10 mg via INTRAVENOUS

## 2022-07-07 MED ORDER — SIMETHICONE 80 MG PO CHEW: 80.0000 mg | CHEWABLE_TABLET | Freq: Four times a day (QID) | ORAL | Status: DC | PRN

## 2022-07-07 MED ORDER — OXYCODONE HCL 5 MG/5ML PO SOLN: 5.0000 mg | Freq: Once | ORAL | Status: DC | PRN

## 2022-07-07 MED ORDER — OXYCODONE HCL 5 MG PO TABS
ORAL_TABLET | ORAL | Status: AC
  Filled 2022-07-07: qty 2

## 2022-07-07 MED ORDER — LIDOCAINE HCL (PF) 2 % IJ SOLN
INTRAMUSCULAR | Status: AC
  Filled 2022-07-07: qty 5

## 2022-07-07 MED ORDER — FENTANYL CITRATE (PF) 250 MCG/5ML IJ SOLN
INTRAMUSCULAR | Status: AC
  Filled 2022-07-07: qty 5

## 2022-07-07 MED ORDER — ACETAMINOPHEN 160 MG/5ML PO SOLN: 325.0000 mg | ORAL | Status: DC | PRN

## 2022-07-07 MED ORDER — MENTHOL 3 MG MT LOZG: 1.0000 | LOZENGE | OROMUCOSAL | Status: DC | PRN

## 2022-07-07 MED ORDER — ROCURONIUM BROMIDE 10 MG/ML (PF) SYRINGE
PREFILLED_SYRINGE | INTRAVENOUS | Status: AC
  Filled 2022-07-07: qty 10

## 2022-07-07 MED ORDER — ZOLPIDEM TARTRATE 5 MG PO TABS: 5.0000 mg | ORAL_TABLET | Freq: Every evening | ORAL | Status: DC | PRN

## 2022-07-07 MED ORDER — FENTANYL CITRATE (PF) 100 MCG/2ML IJ SOLN
25.0000 ug | INTRAMUSCULAR | Status: DC | PRN
  Administered 2022-07-07: 50 ug via INTRAVENOUS

## 2022-07-07 MED ORDER — ONDANSETRON HCL 4 MG/2ML IJ SOLN: 4.0000 mg | Freq: Once | INTRAMUSCULAR | Status: DC | PRN

## 2022-07-07 MED ORDER — OXYCODONE HCL 5 MG PO TABS
5.0000 mg | ORAL_TABLET | ORAL | Status: DC | PRN
  Administered 2022-07-07 – 2022-07-08 (×4): 10 mg via ORAL

## 2022-07-07 MED ORDER — ONDANSETRON HCL 4 MG/2ML IJ SOLN: 4.0000 mg | Freq: Four times a day (QID) | INTRAMUSCULAR | Status: DC | PRN

## 2022-07-07 MED ORDER — GABAPENTIN 100 MG PO CAPS
ORAL_CAPSULE | ORAL | Status: AC
  Filled 2022-07-07: qty 1

## 2022-07-07 MED ORDER — SODIUM CHLORIDE 0.9 % IV SOLN
INTRAVENOUS | Status: DC | PRN
  Administered 2022-07-07: 120 mL

## 2022-07-07 MED ORDER — GABAPENTIN 300 MG PO CAPS
ORAL_CAPSULE | ORAL | Status: AC
  Filled 2022-07-07: qty 1

## 2022-07-07 MED ORDER — SODIUM CHLORIDE 0.9% IV SOLUTION: Freq: Once | INTRAVENOUS | Status: AC

## 2022-07-07 MED ORDER — SODIUM CHLORIDE 0.9 % IV SOLN
INTRAVENOUS | Status: AC
  Filled 2022-07-07: qty 2

## 2022-07-07 MED ORDER — ONDANSETRON HCL 4 MG PO TABS: 4.0000 mg | ORAL_TABLET | Freq: Four times a day (QID) | ORAL | Status: DC | PRN

## 2022-07-07 MED ORDER — POVIDONE-IODINE 10 % EX SWAB: 2.0000 | Freq: Once | CUTANEOUS | Status: DC

## 2022-07-07 MED ORDER — IRBESARTAN 150 MG PO TABS: 150.0000 mg | ORAL_TABLET | Freq: Every day | ORAL | Status: DC

## 2022-07-07 MED ORDER — MIDAZOLAM HCL 2 MG/2ML IJ SOLN
INTRAMUSCULAR | Status: AC
  Filled 2022-07-07: qty 2

## 2022-07-07 MED ORDER — SUGAMMADEX SODIUM 200 MG/2ML IV SOLN
INTRAVENOUS | Status: DC | PRN
  Administered 2022-07-07: 200 mg via INTRAVENOUS

## 2022-07-07 MED ORDER — HYDROMORPHONE HCL 1 MG/ML IJ SOLN
INTRAMUSCULAR | Status: AC
  Filled 2022-07-07: qty 1

## 2022-07-07 MED ORDER — HYDROMORPHONE HCL 1 MG/ML IJ SOLN
0.2000 mg | INTRAMUSCULAR | Status: DC | PRN
  Administered 2022-07-07: 0.6 mg via INTRAVENOUS

## 2022-07-07 MED ORDER — MEPERIDINE HCL 25 MG/ML IJ SOLN: 6.2500 mg | INTRAMUSCULAR | Status: DC | PRN

## 2022-07-07 MED ORDER — LIDOCAINE 2% (20 MG/ML) 5 ML SYRINGE
INTRAMUSCULAR | Status: DC | PRN
  Administered 2022-07-07: 100 mg via INTRAVENOUS

## 2022-07-07 MED ORDER — PANTOPRAZOLE SODIUM 40 MG PO TBEC
DELAYED_RELEASE_TABLET | ORAL | Status: AC
  Filled 2022-07-07: qty 1

## 2022-07-07 MED ORDER — GABAPENTIN 100 MG PO CAPS
100.0000 mg | ORAL_CAPSULE | Freq: Two times a day (BID) | ORAL | Status: DC
  Administered 2022-07-07: 100 mg via ORAL

## 2022-07-07 MED ORDER — ROCURONIUM BROMIDE 10 MG/ML (PF) SYRINGE
PREFILLED_SYRINGE | INTRAVENOUS | Status: DC | PRN
  Administered 2022-07-07 (×2): 20 mg via INTRAVENOUS
  Administered 2022-07-07: 70 mg via INTRAVENOUS

## 2022-07-07 MED ORDER — SENNA 8.6 MG PO TABS
1.0000 | ORAL_TABLET | Freq: Two times a day (BID) | ORAL | Status: DC
  Administered 2022-07-07: 8.6 mg via ORAL

## 2022-07-07 MED ORDER — DEXAMETHASONE SODIUM PHOSPHATE 10 MG/ML IJ SOLN
INTRAMUSCULAR | Status: AC
  Filled 2022-07-07: qty 1

## 2022-07-07 MED ORDER — OXYCODONE HCL 5 MG PO TABS: 5.0000 mg | ORAL_TABLET | Freq: Once | ORAL | Status: DC | PRN

## 2022-07-07 MED ORDER — ONDANSETRON HCL 4 MG/2ML IJ SOLN
INTRAMUSCULAR | Status: DC | PRN
  Administered 2022-07-07: 4 mg via INTRAVENOUS

## 2022-07-07 MED ORDER — FENTANYL CITRATE (PF) 100 MCG/2ML IJ SOLN
INTRAMUSCULAR | Status: AC
  Filled 2022-07-07: qty 2

## 2022-07-07 MED ORDER — ONDANSETRON HCL 4 MG/2ML IJ SOLN
INTRAMUSCULAR | Status: AC
  Filled 2022-07-07: qty 2

## 2022-07-07 MED ORDER — STERILE WATER FOR IRRIGATION IR SOLN
Status: DC | PRN
  Administered 2022-07-07: 500 mL

## 2022-07-07 MED ORDER — ACETAMINOPHEN 325 MG PO TABS: 325.0000 mg | ORAL_TABLET | ORAL | Status: DC | PRN

## 2022-07-07 MED ORDER — FENTANYL CITRATE (PF) 100 MCG/2ML IJ SOLN
INTRAMUSCULAR | Status: DC | PRN
  Administered 2022-07-07: 50 ug via INTRAVENOUS
  Administered 2022-07-07: 100 ug via INTRAVENOUS
  Administered 2022-07-07 (×2): 50 ug via INTRAVENOUS

## 2022-07-07 MED ORDER — ALBUTEROL SULFATE HFA 108 (90 BASE) MCG/ACT IN AERS
2.0000 | INHALATION_SPRAY | Freq: Four times a day (QID) | RESPIRATORY_TRACT | Status: DC | PRN
  Administered 2022-07-07: 2 via RESPIRATORY_TRACT

## 2022-07-07 SURGICAL SUPPLY — 64 items
ADH SKN CLS APL DERMABOND .7 (GAUZE/BANDAGES/DRESSINGS) ×1
APL SRG 38 LTWT LNG FL B (MISCELLANEOUS)
APPLICATOR ARISTA FLEXITIP XL (MISCELLANEOUS) IMPLANT
BARRIER ADHS 3X4 INTERCEED (GAUZE/BANDAGES/DRESSINGS) IMPLANT
BRR ADH 4X3 ABS CNTRL BYND (GAUZE/BANDAGES/DRESSINGS)
CATH FOLEY 3WAY  5CC 16FR (CATHETERS) ×1
CATH FOLEY 3WAY 5CC 16FR (CATHETERS) ×2 IMPLANT
COVER BACK TABLE 60X90IN (DRAPES) ×2 IMPLANT
COVER SURGICAL LIGHT HANDLE (MISCELLANEOUS) IMPLANT
COVER TIP SHEARS 8 DVNC (MISCELLANEOUS) ×2 IMPLANT
COVER TIP SHEARS 8MM DA VINCI (MISCELLANEOUS) ×1
DEFOGGER SCOPE WARMER CLEARIFY (MISCELLANEOUS) ×2 IMPLANT
DERMABOND ADVANCED .7 DNX12 (GAUZE/BANDAGES/DRESSINGS) ×2 IMPLANT
DILATOR CANAL MILEX (MISCELLANEOUS) ×2 IMPLANT
DRAPE ARM DVNC X/XI (DISPOSABLE) ×8 IMPLANT
DRAPE COLUMN DVNC XI (DISPOSABLE) ×2 IMPLANT
DRAPE DA VINCI XI ARM (DISPOSABLE) ×5
DRAPE DA VINCI XI COLUMN (DISPOSABLE) ×1
DRAPE UTILITY 15X26 TOWEL STRL (DRAPES) ×2 IMPLANT
DURAPREP 26ML APPLICATOR (WOUND CARE) ×2 IMPLANT
ELECT REM PT RETURN 9FT ADLT (ELECTROSURGICAL) ×1
ELECTRODE REM PT RTRN 9FT ADLT (ELECTROSURGICAL) ×2 IMPLANT
GLOVE BIOGEL M 6.5 STRL (GLOVE) ×6 IMPLANT
GLOVE BIOGEL M 7.0 STRL (GLOVE) ×6 IMPLANT
GLOVE BIOGEL PI IND STRL 6.5 (GLOVE) ×12 IMPLANT
GLOVE BIOGEL PI IND STRL 7.0 (GLOVE) ×12 IMPLANT
HEMOSTAT ARISTA ABSORB 3G PWDR (HEMOSTASIS) IMPLANT
IRRIG SUCT STRYKERFLOW 2 WTIP (MISCELLANEOUS) ×1
IRRIGATION SUCT STRKRFLW 2 WTP (MISCELLANEOUS) ×2 IMPLANT
IV NS 1000ML (IV SOLUTION) ×1
IV NS 1000ML BAXH (IV SOLUTION) IMPLANT
KIT TURNOVER CYSTO (KITS) ×2 IMPLANT
LEGGING LITHOTOMY PAIR STRL (DRAPES) ×2 IMPLANT
MANIFOLD NEPTUNE II (INSTRUMENTS) ×2 IMPLANT
NDL SPNL 18GX3.5 QUINCKE PK (NEEDLE) IMPLANT
NEEDLE SPNL 18GX3.5 QUINCKE PK (NEEDLE) ×1 IMPLANT
OBTURATOR OPTICAL STANDARD 8MM (TROCAR) ×1
OBTURATOR OPTICAL STND 8 DVNC (TROCAR) ×1
OBTURATOR OPTICALSTD 8 DVNC (TROCAR) ×2 IMPLANT
OCCLUDER COLPOPNEUMO (BALLOONS) ×2 IMPLANT
PACK ROBOT WH (CUSTOM PROCEDURE TRAY) ×2 IMPLANT
PACK ROBOTIC GOWN (GOWN DISPOSABLE) ×2 IMPLANT
PACK TRENDGUARD 450 HYBRID PRO (MISCELLANEOUS) IMPLANT
PAD OB MATERNITY 4.3X12.25 (PERSONAL CARE ITEMS) ×2 IMPLANT
PAD PREP 24X48 CUFFED NSTRL (MISCELLANEOUS) ×2 IMPLANT
PROTECTOR NERVE ULNAR (MISCELLANEOUS) ×2 IMPLANT
SEAL CANN UNIV 5-8 DVNC XI (MISCELLANEOUS) ×8 IMPLANT
SEAL XI 5MM-8MM UNIVERSAL (MISCELLANEOUS) ×4
SEALER VESSEL DA VINCI XI (MISCELLANEOUS) ×1
SEALER VESSEL EXT DVNC XI (MISCELLANEOUS) IMPLANT
SET TRI-LUMEN FLTR TB AIRSEAL (TUBING) ×2 IMPLANT
SPIKE FLUID TRANSFER (MISCELLANEOUS) ×4 IMPLANT
SUT VIC AB 0 CT1 27 (SUTURE) ×2
SUT VIC AB 0 CT1 27XBRD ANBCTR (SUTURE) ×4 IMPLANT
SUT VICRYL 0 UR6 27IN ABS (SUTURE) IMPLANT
SUT VICRYL RAPIDE 4/0 PS 2 (SUTURE) ×6 IMPLANT
SUT VLOC 180 0 9IN  GS21 (SUTURE) ×1
SUT VLOC 180 0 9IN GS21 (SUTURE) ×2 IMPLANT
TIP UTERINE 6.7X10CM GRN DISP (MISCELLANEOUS) IMPLANT
TOWEL OR 17X24 6PK STRL BLUE (TOWEL DISPOSABLE) IMPLANT
TOWEL OR 17X26 10 PK STRL BLUE (TOWEL DISPOSABLE) ×2 IMPLANT
TRAP SPECIMEN MUCUS 40CC (MISCELLANEOUS) IMPLANT
TRENDGUARD 450 HYBRID PRO PACK (MISCELLANEOUS) ×1
TROCAR PORT AIRSEAL 8X120 (TROCAR) ×2 IMPLANT

## 2022-07-07 NOTE — Op Note (Signed)
07/07/2022  3:56 PM  PATIENT:  Shari Gibson  56 y.o. female  PRE-OPERATIVE DIAGNOSIS:  Abnormal Uterine Bleeding, Fibroids, Adnexal Mass  POST-OPERATIVE DIAGNOSIS:  Abnormal Uterine Bleeding Fibroids Adnexal Mass  PROCEDURE:  Procedure(s): XI ROBOTIC ASSISTED LAPAROSCOPIC HYSTERECTOMY AND BILATERAL SALPINGO-OOPHORECTOMY (Bilateral)  SURGEON:  Surgeon(s) and Role:    Christophe Louis, MD - Primary    Drema Dallas, DO - Assisting  PHYSICIAN ASSISTANT:   ASSISTANTS: Dr. Drema Dallas assisted due to complexity of the anatomy and concern for possible adhesive disease    ANESTHESIA:   general  EBL:  50 mL   BLOOD ADMINISTERED: Ms. Abella received 1 unit of pRBC prior to start of the surgery due to low Hgb 7.7.   DRAINS: Urinary Catheter (Foley)   LOCAL MEDICATIONS USED:  OTHER ropivacaine  SPECIMEN:  Source of Specimen:  uterus cervix bilateral fallopian tubes and ovaries. Left ovarian mass   DISPOSITION OF SPECIMEN:  PATHOLOGY  COUNTS:  YES  TOURNIQUET:  * No tourniquets in log *  DICTATION: .Note written in EPIC  PLAN OF CARE: Admit for overnight observation  PATIENT DISPOSITION:  PACU - hemodynamically stable.   Delay start of Pharmacological VTE agent (>24 hrs) due to surgical blood loss or risk of bleeding: not applicable  Findings: normal external genitalia. Normal appearing cervix. 18 week size uterus. Evidence of previous tubal ligation. Normal right ovary. Left ovary with 7 cm mass.   Procedure: The patient was taken to the operating room #5 [facility] where she was placed under general anesthesia.Time out was performed. Marland Kitchen She was placed in dorsal lithotomy position and prepped and draped in the usual sterile fashion. A weighted speculum was placed into the vagina. A Deaver was placed anteriorly for retraction. The anterior lip of the cervix was grasped with a single-tooth tenaculum. The vaginal mucosa was injected with 2.5 cc of ropivacaine at the 2/4/ 8  and 10 o'clock positions. The uterus was sounded to 10 cm. the cervix was dilated to 6 mm . 0 vicryl suture placed at the 12 and 6:00 positions Of the cervix to facilitate placement of a Ru mi uterine manipulator. The manipulator was placed without difficulty. Weighted speculum and Deaver were removed .  Attention was turned to the patient's abdomen where a 8 mm trocar was placed 2 cm above the umbilicus. under direct visualization . The pneumoperitoneum was achieved with PCO2 gas. The laparoscope was removed. 60 cc of ropivacaine were injected into the abdominal cavity. The laparoscope was reinserted. An 8 mm trocar was placed in the right upper quadrant 16 centimeters from the umbilicus.later connected to robotic arm #4). An 8MM incision was made in the Right upper quadrant TROCAR WAS PLACED 8 cm from the umbilicus. Later connected to robotic arm #3. An 8 mm incision was made in the left upper quadrant 16 cm from the umbilicus and connected to robot arm #1. Marland Kitchen Attention was turned to the left upper quadrant where a 8 mm midclavicular assistant trocar was placed. ( All incision sites were injected with 10 cc of ropivacaine prior to port placement. )  Once all ports had been placed under direct visualization.The laparoscope was removed and the [place] robotic system was thin right-sided docked. The robotic arms were connected to the corresponding trocars as listed above. The laparoscope was then reinserted. The long tip bipolar forceps were placed into port #1. The prograsp  placed in the port #4. A vessel sealer ( alternating with monopolar scissors) was  placed in port #3. All instruments were directed into the pelvis under direct visualization.    Attention was turned to the surgeons console.. The left infundibulo-pelvic  ligament was cauterized and transected with the vessel sealer The broad ligament was cauterized and transected with the vessel sealer .The round ligament was cauterized and transected with  the vessel sealer  The anterior leaf of broad ligament was incised along the bladder reflection to the midline.  The right  infundibulo-pelvic  ligament was cauterized and transected with the vessel sealer. The right broad ligament was cauterized and transected with the vessel sealer. The right round ligament was cauterized and transected with the vessel sealer The broad ligament was incised to the midline. The bladder was dissected off the lower uterine segments of the cervix via sharp and blunt dissection.   The uterine arteries were skeleton bilaterally. They were  cauterized and transected with the vessel sealer The KOH ring was identified. The anterior colpotomy was performed followed by the posterior colpotomy. Once the uterus,cervix and bilateral fallopian tubes and ovaries  were completely excised the uterus was bivalved with laparoscopic hook to facilitate removal through the vagina.   Cobra forceps were placed in the port #1 and the mega needle driver was placed in to port #3.  The vaginal cuff was closed with running suture if 0 v-lock. The pelvis was irrigated. Marland KitchenMarland KitchenMarland KitchenExcellent hemostasis was noted. Arista was placed along the vaginal cuff.  All pelvic pedicles were examined and hemostasis was noted.  All instruments removed from the ports. All ports were removed under direct Visualization. The pneumoperitoneum was released. The skin incisions were closed with 4-0 Vicryl and then covered with Derma bond.     Sponge lap and needle counts were correct x. The patient was awakened from anesthesia and taken to the recovery room in stable condition.

## 2022-07-07 NOTE — Progress Notes (Signed)
Called to room by pt. Pt states "something's different with my breathing, like when I exhale". Pt denies SOB, dyspnea, chest pain, difficulty swallowing. O2 sat 100% RA, other VS WNL. No tachypnea noted. HOB elevated, ISP demonstrated and pt completed 5 times to level of 1200 without difficulty. Pt given albuterol 2 puffs per MD order. Ambulated pt in hall without difficulty, pt tolerated well. Pt reports abdominal pain level 2/10, abdomen soft, non-tender, incisions C/D/I. Pt reports PMH of asthma, denies feelings of current asthma attack/exacerbation. Pt advised to call RN immediately if s/s worsen or any new s/s, pt verbalized understanding. Will continue to monitor.   Lyndel Pleasure, RN

## 2022-07-07 NOTE — Transfer of Care (Signed)
Immediate Anesthesia Transfer of Care Note  Patient: Shari Gibson  Procedure(s) Performed: XI ROBOTIC ASSISTED LAPAROSCOPIC HYSTERECTOMY AND BILATERAL SALPINGO-OOPHORECTOMY (Bilateral: Abdomen)  Patient Location: PACU  Anesthesia Type:General  Level of Consciousness: drowsy  Airway & Oxygen Therapy: Patient Spontanous Breathing and Patient connected to nasal cannula oxygen  Post-op Assessment: Report given to RN  Post vital signs: Reviewed and stable  Last Vitals:  Vitals Value Taken Time  BP 175/96 07/07/22 1608  Temp 36.9 C 07/07/22 1608  Pulse 89 07/07/22 1615  Resp 18 07/07/22 1615  SpO2 100 % 07/07/22 1615  Vitals shown include unvalidated device data.  Last Pain:  Vitals:   07/07/22 1150  TempSrc: Oral  PainSc:       Patients Stated Pain Goal: 5 (89/21/19 4174)  Complications: No notable events documented.

## 2022-07-07 NOTE — H&P (Signed)
Date of Initial H&P: 07/06/2022  History reviewed, patient examined, no change in status, stable for surgery.

## 2022-07-07 NOTE — Anesthesia Procedure Notes (Signed)
Procedure Name: Intubation Date/Time: 07/07/2022 1:04 PM  Performed by: Rogers Blocker, CRNAPre-anesthesia Checklist: Patient identified, Emergency Drugs available, Suction available and Patient being monitored Patient Re-evaluated:Patient Re-evaluated prior to induction Oxygen Delivery Method: Circle system utilized Preoxygenation: Pre-oxygenation with 100% oxygen Induction Type: IV induction Ventilation: Mask ventilation without difficulty Laryngoscope Size: Mac and 3 Grade View: Grade I Tube type: Oral Tube size: 7.0 mm Number of attempts: 1 Airway Equipment and Method: Stylet Placement Confirmation: ETT inserted through vocal cords under direct vision, positive ETCO2 and breath sounds checked- equal and bilateral Secured at: 21 cm Tube secured with: Tape Dental Injury: Teeth and Oropharynx as per pre-operative assessment

## 2022-07-07 NOTE — Anesthesia Preprocedure Evaluation (Addendum)
Anesthesia Evaluation  Patient identified by MRN, date of birth, ID band Patient awake    Reviewed: Allergy & Precautions, H&P , NPO status , Patient's Chart, lab work & pertinent test results  Airway Mallampati: I  TM Distance: >3 FB Neck ROM: Full    Dental no notable dental hx. (+) Teeth Intact, Dental Advisory Given   Pulmonary former smoker   Pulmonary exam normal breath sounds clear to auscultation       Cardiovascular hypertension, Pt. on medications Normal cardiovascular exam Rhythm:Regular Rate:Normal  ECHO 6/23 NL EF, NL Valves   Neuro/Psych   Anxiety     Possible TIA vs food allergy. Pt experienced ataxia, slurred speech, perioral numbness and tingling while eating breakfast. CT and MRI of head were negative for a stroke.  TIA negative psych ROS   GI/Hepatic negative GI ROS, Neg liver ROS,,,  Endo/Other  negative endocrine ROS    Renal/GU negative Renal ROS  negative genitourinary   Musculoskeletal negative musculoskeletal ROS (+)    Abdominal   Peds negative pediatric ROS (+)  Hematology  (+) Blood dyscrasia, anemia   Anesthesia Other Findings   Reproductive/Obstetrics negative OB ROS                             Anesthesia Physical Anesthesia Plan  ASA: 3  Anesthesia Plan: General   Post-op Pain Management: Ofirmev IV (intra-op)*   Induction: Intravenous  PONV Risk Score and Plan: 2 and Ondansetron, Dexamethasone and Treatment may vary due to age or medical condition  Airway Management Planned: Oral ETT  Additional Equipment: None  Intra-op Plan:   Post-operative Plan: Extubation in OR  Informed Consent:   Plan Discussed with: Anesthesiologist and CRNA  Anesthesia Plan Comments: (  )       Anesthesia Quick Evaluation

## 2022-07-08 ENCOUNTER — Encounter (HOSPITAL_BASED_OUTPATIENT_CLINIC_OR_DEPARTMENT_OTHER): Payer: Self-pay | Admitting: Obstetrics and Gynecology

## 2022-07-08 DIAGNOSIS — D251 Intramural leiomyoma of uterus: Secondary | ICD-10-CM | POA: Diagnosis not present

## 2022-07-08 LAB — HEMOGLOBIN: Hemoglobin: 8.3 g/dL — ABNORMAL LOW (ref 12.0–15.0)

## 2022-07-08 MED ORDER — OXYCODONE HCL 5 MG PO TABS: 5.0000 mg | ORAL_TABLET | Freq: Four times a day (QID) | ORAL | 0 refills | Status: DC | PRN

## 2022-07-08 MED ORDER — ACETAMINOPHEN 500 MG PO TABS
ORAL_TABLET | ORAL | Status: AC
  Filled 2022-07-08: qty 2

## 2022-07-08 MED ORDER — OXYCODONE HCL 5 MG PO TABS
ORAL_TABLET | ORAL | Status: AC
  Filled 2022-07-08: qty 2

## 2022-07-08 MED ORDER — IBUPROFEN 800 MG PO TABS: 800.0000 mg | ORAL_TABLET | Freq: Three times a day (TID) | ORAL | 1 refills | Status: DC | PRN

## 2022-07-08 NOTE — Discharge Summary (Signed)
Physician Discharge Summary  Patient ID: Shari Gibson MRN: [id number] DOB/AGE: Sep 14, 1965 56 y.o.  Admit date: 07/07/2022 Discharge date: 07/08/2022  Admission Diagnoses:Fibroids/ abnormal uterine bleeding / iron deficiency anemia   Discharge Diagnoses:  Principal Problem:   Fibroids Active Problems:   Abnormal uterine bleeding   S/P hysterectomy with oophorectomy   Discharged Condition: stable  Hospital Course: pt was admitted for observation after undergoing a robotic assisted laparoscopic  hysterectomy with bilateral salpingo-oophorectomy. She did well postoperatively with return of bowel and bladder function. She is tolerating po and ambulating without difficulty. She is discharged on pod #1  Consults: None  Significant Diagnostic Studies: labs: pod #1 8.3  ... Presurgery HGB 7.7 pt received 1 unit pRBC prior to surgery   Treatments: surgery: robotic assisted laparoscopic  hysterectomy with bilateral salpingo-oophorectomy  Discharge Exam: Blood pressure (!) 151/73, pulse 78, temperature 98.5 F (36.9 C), resp. rate 18, height '5\' 7"'$  (1.702 m), weight 99.2 kg, last menstrual period 06/24/2022, SpO2 100 %. General appearance: alert, cooperative, and no distress Resp: no distress  GI: soft appropriately tender nondistended  Extremities: extremities normal, atraumatic, no cyanosis or edema Incision/Wound: well approximated no erythema or exudate   Disposition: Discharge disposition: 01-Home or Self Care       Discharge Instructions     Call MD for:  persistant nausea and vomiting   Complete by: As directed    Call MD for:  redness, tenderness, or signs of infection (pain, swelling, redness, odor or green/yellow discharge around incision site)   Complete by: As directed    Call MD for:  severe uncontrolled pain   Complete by: As directed    Call MD for:  temperature >100.4   Complete by: As directed    Diet - low sodium heart healthy   Complete by: As  directed    Driving Restrictions   Complete by: As directed    Avoid driving for 1 week   Increase activity slowly   Complete by: As directed    Lifting restrictions   Complete by: As directed    Avoid lifting over 10 lbs for at least 6 weeks and until approved by Dr. Landry Mellow   May shower / Bathe   Complete by: As directed    May walk up steps   Complete by: As directed    No wound care   Complete by: As directed    Sexual Activity Restrictions   Complete by: As directed    Avoid sex for at least 6 weeks and until approved by Dr. Landry Mellow      Allergies as of 07/08/2022   No Known Allergies      Medication List     TAKE these medications    acetaminophen 500 MG tablet Commonly known as: TYLENOL Take 1,000 mg by mouth every 6 (six) hours as needed for mild pain (muscle aches).   albuterol 108 (90 Base) MCG/ACT inhaler Commonly known as: VENTOLIN HFA Inhale 2 puffs into the lungs every 6 (six) hours as needed for wheezing or shortness of breath.   aspirin EC 81 MG tablet Take 81 mg by mouth daily. Swallow whole. Per pt, Dr. Landry Mellow told her to stop taking ASA before 07/07/22. Stopped around 06/23/22.   budesonide-formoterol 80-4.5 MCG/ACT inhaler Commonly known as: SYMBICORT Inhale 2 puffs into the lungs in the morning and at bedtime.   diphenhydramine-acetaminophen 25-500 MG Tabs tablet Commonly known as: TYLENOL PM Take 1 tablet by mouth at bedtime as needed.  EPINEPHrine 0.3 mg/0.3 mL Soaj injection Commonly known as: EPI-PEN Inject 0.3 mg into the muscle as needed for anaphylaxis.   esomeprazole 20 MG capsule Commonly known as: NEXIUM Take 20 mg by mouth daily.   folic acid 1 MG tablet Commonly known as: FOLVITE Take 1 tablet (1 mg total) by mouth daily.   ibuprofen 800 MG tablet Commonly known as: ADVIL Take 1 tablet (800 mg total) by mouth every 8 (eight) hours as needed for moderate pain, cramping or mild pain.   iron polysaccharides 150 MG  capsule Commonly known as: NIFEREX Take 1 capsule (150 mg total) by mouth daily. What changed: when to take this   multivitamin with minerals Tabs tablet Take 1 tablet by mouth daily. Takes every few days per pt on 06/30/22.   oxyCODONE 5 MG immediate release tablet Commonly known as: Oxy IR/ROXICODONE Take 1-2 tablets (5-10 mg total) by mouth every 6 (six) hours as needed for moderate pain.   rosuvastatin 20 MG tablet Commonly known as: CRESTOR Take 1 tablet (20 mg total) by mouth daily.   valsartan 160 MG tablet Commonly known as: Diovan Take 1 tablet (160 mg total) by mouth daily.        Follow-up Information     Christophe Louis, MD. Go in 2 week(s).   Specialty: Obstetrics and Gynecology Contact information: [address] [place] [address] [phone]                 Signed: Christophe Louis 07/08/2022, 7:39 AM

## 2022-07-08 NOTE — Anesthesia Postprocedure Evaluation (Signed)
Anesthesia Post Note  Patient: Shari Gibson  Procedure(s) Performed: XI ROBOTIC ASSISTED LAPAROSCOPIC HYSTERECTOMY AND BILATERAL SALPINGO-OOPHORECTOMY (Bilateral: Abdomen)     Patient location during evaluation: PACU Anesthesia Type: General Level of consciousness: awake and alert Pain management: pain level controlled Vital Signs Assessment: post-procedure vital signs reviewed and stable Respiratory status: spontaneous breathing, nonlabored ventilation, respiratory function stable and patient connected to nasal cannula oxygen Cardiovascular status: blood pressure returned to baseline and stable Postop Assessment: no apparent nausea or vomiting Anesthetic complications: no  No notable events documented.  Last Vitals:  Vitals:   07/08/22 0600 07/08/22 0745  BP: (!) 151/73 (!) 142/70  Pulse: 78 93  Resp: 18 18  Temp: 36.9 C 36.6 C  SpO2: 100% 100%    Last Pain:  Vitals:   07/08/22 0745  TempSrc:   PainSc: 4    Pain Goal: Patients Stated Pain Goal: 5 (07/07/22 1800)                 Banjamin Stovall

## 2022-07-11 LAB — TYPE AND SCREEN
ABO/RH(D): B POS
Antibody Screen: NEGATIVE
Unit division: 0
Unit division: 0
Unit division: 0

## 2022-07-11 LAB — BPAM RBC
Blood Product Expiration Date: 202311292359
Blood Product Expiration Date: 202311302359
Blood Product Expiration Date: 202312012359
ISSUE DATE / TIME: 202311211119
Unit Type and Rh: 7300
Unit Type and Rh: 7300
Unit Type and Rh: 7300

## 2022-07-16 LAB — CYTOLOGY - NON PAP

## 2022-07-17 LAB — SURGICAL PATHOLOGY

## 2022-07-23 ENCOUNTER — Encounter: Payer: Self-pay | Admitting: Gynecologic Oncology

## 2022-07-23 ENCOUNTER — Telehealth: Payer: Self-pay

## 2022-07-23 NOTE — Telephone Encounter (Signed)
Spoke with Shari Gibson regarding her referral to GYN oncology. She has an appointment scheduled with Dr. Berline Lopes on 07/24/22 at 10:30. Patient agrees to date and time. She has been provided with office address and location. She is also aware of our mask and visitor policy. Patient verbalized understanding and will call with any questions.

## 2022-07-23 NOTE — Telephone Encounter (Signed)
LVM for Shari Gibson to call office regarding scheduling a new pt appointment with Dr. Berline Lopes

## 2022-07-24 ENCOUNTER — Encounter: Payer: Self-pay | Admitting: Gynecologic Oncology

## 2022-07-24 ENCOUNTER — Inpatient Hospital Stay: Payer: Managed Care, Other (non HMO) | Attending: Gynecologic Oncology | Admitting: Gynecologic Oncology

## 2022-07-24 ENCOUNTER — Inpatient Hospital Stay: Payer: Managed Care, Other (non HMO)

## 2022-07-24 VITALS — BP 168/75 | HR 98 | Temp 98.5°F | Resp 16 | Ht 66.54 in | Wt 217.2 lb

## 2022-07-24 DIAGNOSIS — J45909 Unspecified asthma, uncomplicated: Secondary | ICD-10-CM | POA: Diagnosis not present

## 2022-07-24 DIAGNOSIS — Z90710 Acquired absence of both cervix and uterus: Secondary | ICD-10-CM | POA: Insufficient documentation

## 2022-07-24 DIAGNOSIS — Z7951 Long term (current) use of inhaled steroids: Secondary | ICD-10-CM | POA: Diagnosis not present

## 2022-07-24 DIAGNOSIS — I10 Essential (primary) hypertension: Secondary | ICD-10-CM | POA: Insufficient documentation

## 2022-07-24 DIAGNOSIS — D3912 Neoplasm of uncertain behavior of left ovary: Secondary | ICD-10-CM

## 2022-07-24 DIAGNOSIS — Z8673 Personal history of transient ischemic attack (TIA), and cerebral infarction without residual deficits: Secondary | ICD-10-CM | POA: Diagnosis not present

## 2022-07-24 DIAGNOSIS — B009 Herpesviral infection, unspecified: Secondary | ICD-10-CM | POA: Diagnosis not present

## 2022-07-24 DIAGNOSIS — Z90722 Acquired absence of ovaries, bilateral: Secondary | ICD-10-CM | POA: Diagnosis not present

## 2022-07-24 DIAGNOSIS — K219 Gastro-esophageal reflux disease without esophagitis: Secondary | ICD-10-CM | POA: Diagnosis not present

## 2022-07-24 DIAGNOSIS — N939 Abnormal uterine and vaginal bleeding, unspecified: Secondary | ICD-10-CM

## 2022-07-24 DIAGNOSIS — Z79899 Other long term (current) drug therapy: Secondary | ICD-10-CM | POA: Diagnosis not present

## 2022-07-24 DIAGNOSIS — D219 Benign neoplasm of connective and other soft tissue, unspecified: Secondary | ICD-10-CM

## 2022-07-24 DIAGNOSIS — E669 Obesity, unspecified: Secondary | ICD-10-CM

## 2022-07-24 NOTE — Progress Notes (Unsigned)
GYNECOLOGIC ONCOLOGY NEW PATIENT CONSULTATION   Patient Name: Shari Gibson  Patient Age: 56 y.o. Date of Service: 07/24/22 Referring Provider: Dr. 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  The patient is doing very well from a post-operative standpoint. She has follow-up with her surgeon in the next 2 weeks.  We discussed pathology from recent surgery. The left ovary revealed an adult-type granulosa cell tumor. I spoke with Dr. Landry Mellow prior to the patient's visit to confirm that there was in fact no surgical spillage from the left ovary during its extraction. Pelvic washings were negative.   Given frequent estrogen production by granulosa cell tumors, I suspect that the patient's abnormal uterine bleeding was caused by her tumor.  We discussed the excellent prognosis for patients with stage IA GCT of the ovary. Luckily, the patient does not have risk factors that increase her risk of recurrence (eg size of tumor, surgical spillage). Even so, we discussed that there is a risk of recurrence (described between 5 and 30% with most estimates in the literature around 20% for early stage disease). Recurrences can often be quite delayed after the initial diagnosis making long-term surveillance very important.   I have recommended CT of the abdomen and pelvis to assure no evidence of metastatic disease that would require a second surgery. We will plan to get inhibin B and AMH today. I expect these to be normal. Unfortunately, we don't know if they were elevated prior to surgery, but I would recommend surveillance with at least an inhibin B for future visits.  In terms of surveillance, given low-risk disease, we discussed NCCN surveillance recommendations for visits every 6 months. I think these  can be alternating between my office and the patient's OBGYN. We will plan for an exam and tumor markers at each visit.  We discussed signs and symptoms that would be concerning for recurrent granulosa cell tumor and I stressed the importance of calling if she develops any symptoms.   A copy of this note was sent to the patient's referring provider.   50 minutes of total time was spent for this patient encounter, including preparation, face-to-face counseling with the patient and coordination of care, and documentation of the encounter.  Jeral Pinch, MD  Division of Gynecologic Oncology  Department of Obstetrics and Gynecology  [facility]  ___________________________________________  Chief Complaint: No chief complaint on file.   History of Present Illness:  Shari Gibson is a 56 y.o. y.o. female who is seen in consultation at the request of Dr. Landry Mellow for an evaluation of granulosa cell tumor diagnosed after recent hysterectomy.   The patient reports a change in bleeding within the last year. She would have months with no menses and then have some menses that would last for 8-21 days. When she bled, menses were often quite heavy with passage of clots. She required multiple transfusions in the year prior to surgery. She denies any change in weight during this period. She describes having had brief, intermittent episodes of a sharp contraction-like pain in her vagina.  Pelvic ultrasound exam at [place] on 03/25/2022 revealed a uterus measuring 16.7 x 11.1 x 10.1 cm.  Multiple fibroids noted measuring up to 4.6 cm.  Ovary normal in appearance, left ovary not visualized but a left hypoechoic solid mass measuring 7 x  6 x 5.9 cm noted with some blood flow around the mass.  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  Endometrial biopsy in mid August revealed proliferative  endometrium, no hyperplasia or malignancy.  Preoperative labs included a normal CA125 (8.3).  On 06/25/22, the patient underwent robotic total hysterectomy and BSO.  This was performed in the setting of uterine fibroids, abnormal uterine bleeding, and an adnexal mass.  Findings at the time of surgery included an 18-week size uterus with, normal right ovary, left ovary with 7 cm mass.  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  Specimen was received disrupted but no surface involvement noted.  Pelvic washings were negative.  Today, she reports doing well. Denies significant abdominal pain. She endorses a good appetite, normal bowel and baseline bladder function. She denies any vaginal bleeding.   PAST MEDICAL HISTORY:  Past Medical History:  Diagnosis Date   Anemia 01/14/2017   hx of blood transfusion on 02/16/22   Asthma    see 05/11/22 PFT in Epic, follows with Dr. Lenice Llamas at [place], [place] 05/11/22 in [place] as of 06/24/22.   Fibroids    GERD (gastroesophageal reflux disease)    Follows with [facility], [facility] 04/28/22 w/ Michail Sermon, PA as of 06/24/22.   Granulosa cell tumor of left ovary    Herpes    never had symptoms   History of hiatal hernia 05/19/2022   1 cm hiatal hernia found during EGD   Hypertension    Follows w/ [facility]. Lindell No[facility] at [facility].   TIA (transient ischemic attack) 01/27/2022   Possible TIA vs food allergy. Pt experienced ataxia, slurred speech, perioral numbness and tingling while eating breakfast. CT and MRI of head were negative for a stroke. See 01/27/22 ED visit in [place]. Patient follows w/ [facility], [facility] 05/21/22 w/ Frann Rider, NP (as of 06/24/22.)   Wears glasses      PAST SURGICAL HISTORY:  Past Surgical History:  Procedure Laterality Date   COLONOSCOPY  02/28/2020   [place]. Dr. Juanita Craver. The entire portion of the colon appeared normal.   ROBOTIC ASSISTED  LAPAROSCOPIC HYSTERECTOMY AND SALPINGECTOMY Bilateral 07/07/2022   Procedure: XI ROBOTIC ASSISTED LAPAROSCOPIC HYSTERECTOMY AND BILATERAL SALPINGO-OOPHORECTOMY;  Surgeon: Christophe Louis, MD;  Location: [facility];  Service: Gynecology;  Laterality: Bilateral;   TUBAL LIGATION  1991   UPPER GASTROINTESTINAL ENDOSCOPY  2023    OB/GYN HISTORY:  OB History  Gravida Para Term Preterm AB Living  4 3          SAB IAB Ectopic Multiple Live Births               # Outcome Date GA Lbr Len/2nd Weight Sex Delivery Anes PTL Lv  4 Gravida           3 Para           2 Para           1 Para             Patient's last menstrual period was 06/24/2022 (exact date).  Age at menarche: 35  Age at menopause: 63 Hx of HRT: denies Hx of STDs: HSV Last pap: 2021 - [facility] History of abnormal pap smears: yes  SCREENING STUDIES:  Last mammogram: 2020 or 2021 per patient  Last colonoscopy: within last 3-4 years  per patient  MEDICATIONS: Outpatient Encounter Medications as of 07/24/2022  Medication Sig   acetaminophen (TYLENOL) 500 MG tablet Take 1,000 mg by mouth every 6 (six) hours as needed for mild pain (muscle aches).   albuterol (PROVENTIL HFA;VENTOLIN HFA) 108 (90 Base) MCG/ACT inhaler Inhale 2 puffs into the lungs every 6 (six) hours as needed for wheezing or shortness of breath.   budesonide-formoterol (SYMBICORT) 80-4.5 MCG/ACT inhaler Inhale 2 puffs into the lungs in the morning and at bedtime.   diphenhydramine-acetaminophen (TYLENOL PM) 25-500 MG TABS tablet Take 1 tablet by mouth at bedtime as needed.   EPINEPHrine 0.3 mg/0.3 mL IJ SOAJ injection Inject 0.3 mg into the muscle as needed for anaphylaxis.   esomeprazole (NEXIUM) 20 MG capsule Take 20 mg by mouth daily.   folic acid (FOLVITE) 1 MG tablet Take 1 tablet (1 mg total) by mouth daily.   ibuprofen (ADVIL) 800 MG tablet Take 1 tablet (800 mg total) by mouth every 8 (eight) hours as needed for moderate pain, cramping or mild  pain.   iron polysaccharides (NIFEREX) 150 MG capsule Take 1 capsule (150 mg total) by mouth daily. (Patient taking differently: Take 150 mg by mouth every other day.)   Multiple Vitamin (MULTIVITAMIN WITH MINERALS) TABS tablet Take 1 tablet by mouth daily. Takes every few days per pt on 06/30/22.   rosuvastatin (CRESTOR) 20 MG tablet Take 1 tablet (20 mg total) by mouth daily.   valsartan (DIOVAN) 160 MG tablet Take 1 tablet (160 mg total) by mouth daily.   [DISCONTINUED] oxyCODONE (OXY IR/ROXICODONE) 5 MG immediate release tablet Take 1-2 tablets (5-10 mg total) by mouth every 6 (six) hours as needed for moderate pain.   [DISCONTINUED] aspirin EC 81 MG tablet Take 81 mg by mouth daily. Swallow whole. Per pt, Dr. Landry Mellow told her to stop taking ASA before 07/07/22. Stopped around 06/23/22.   No facility-administered encounter medications on file as of 07/24/2022.    ALLERGIES:  No Known Allergies   FAMILY HISTORY:  Family History  Adopted: Yes  Problem Relation Age of Onset   Diabetes Mother    Hypertension Mother    Alzheimer's disease Maternal Grandmother    Diabetes Maternal Aunt    Hypertension Maternal Aunt    Asthma Daughter    Lung disease Neg Hx      SOCIAL HISTORY:  Social Connections: Not on file    REVIEW OF SYSTEMS:  Denies appetite changes, fevers, chills, fatigue, unexplained weight changes. Denies hearing loss, neck lumps or masses, mouth sores, ringing in ears or voice changes. Denies cough or wheezing.  Denies shortness of breath. Denies chest pain or palpitations. Denies leg swelling. Denies abdominal distention, pain, blood in stools, constipation, diarrhea, nausea, vomiting, or early satiety. Denies pain with intercourse, dysuria, frequency, hematuria or incontinence. Denies hot flashes, pelvic pain, vaginal bleeding or vaginal discharge.   Denies joint pain, back pain or muscle pain/cramps. Denies itching, rash, or wounds. Denies dizziness, headaches,  numbness or seizures. Denies swollen lymph nodes or glands, denies easy bruising or bleeding. Denies anxiety, depression, confusion, or decreased concentration.  Physical Exam:  Vital Signs for this encounter:  Blood pressure (!) 168/75, pulse 98, temperature 98.5 F (36.9 C), temperature source Oral, resp. rate 16, height 5' 6.54" (1.69 m), weight 217 lb 3.2 oz (98.5 kg), last menstrual period 06/24/2022, SpO2 100 %. Body mass index is 34.5 kg/m. General: Alert, oriented, no acute distress.  HEENT: Normocephalic, atraumatic. Sclera anicteric.  Chest: Clear to auscultation  bilaterally. No wheezes, rhonchi, or rales. Cardiovascular: Regular rate and rhythm, no murmurs, rubs, or gallops.  Abdomen: Obese. Normoactive bowel sounds. Soft, nondistended, nontender to palpation. No masses or hepatosplenomegaly appreciated. No palpable fluid wave.  Well healed incisions. Extremities: Grossly normal range of motion. Warm, well perfused. No edema bilaterally.  Skin: No rashes or lesions.  Lymphatics: No cervical, supraclavicular, or inguinal adenopathy.  GU: Deferred.  LABORATORY AND RADIOLOGIC DATA:  Outside medical records were reviewed to synthesize the above history, along with the history and physical obtained during the visit.   Lab Results  Component Value Date   WBC 6.4 07/07/2022   HGB 8.3 (L) 07/08/2022   HCT 25.4 (L) 07/07/2022   PLT 360 07/07/2022   GLUCOSE 106 (H) 07/06/2022   CHOL 173 01/27/2022   TRIG 144 01/27/2022   HDL 69 01/27/2022   LDLCALC 75 01/27/2022   ALT 18 02/13/2022   AST 22 02/13/2022   NA 137 07/06/2022   K 3.6 07/06/2022   CL 104 07/06/2022   CREATININE 0.63 07/06/2022   BUN 9 07/06/2022   CO2 23 07/06/2022   TSH 0.914 06/09/2018   INR 1.0 02/14/2022   HGBA1C 5.0 01/27/2022

## 2022-07-24 NOTE — Patient Instructions (Addendum)
It was very nice to meet you today.  I am glad you are recovering so well from surgery.  Today, we discussed your recent diagnosis of a granulosa cell tumor.  This is a rare type of cancer of the ovary.  Based on pathology from surgery, the tumor had not spread to anything else that was removed.  We are going to get a CT scan to make sure that there is nothing else in your abdomen or pelvis that looks concerning for possible cancer spread.  As long as CT scan looks normal, it given your early stage cancer, no additional treatment is needed.  We will plan on visits every 6 months, alternating between your OB/GYN in my office.  We will plan to perform a pelvic exam and get blood work at each of these visits.  We will get this blood work today to establish what your baseline is after surgery.  As we discussed today, we will plan on your first follow-up visit with me in 6 months.

## 2022-07-27 LAB — INHIBIN B: Inhibin B: <7 pg/mL

## 2022-07-29 ENCOUNTER — Ambulatory Visit (HOSPITAL_COMMUNITY): Admission: RE | Admit: 2022-07-29 | Payer: Managed Care, Other (non HMO) | Source: Ambulatory Visit

## 2022-07-31 LAB — ANTI MULLERIAN HORMONE: ANTI-MULLERIAN HORMONE (AMH): 1.07 ng/mL

## 2022-08-03 ENCOUNTER — Telehealth: Payer: Self-pay | Admitting: *Deleted

## 2022-08-03 NOTE — Telephone Encounter (Signed)
Late entry---on 12/14 checked patient's chart to see if she attended her CT scan on 12/13. Patient had not scan, checked appt list. CT scan was canceled. Appt rescheduled and patient aware

## 2022-08-06 ENCOUNTER — Encounter: Payer: Self-pay | Admitting: Gynecologic Oncology

## 2022-08-11 ENCOUNTER — Ambulatory Visit (HOSPITAL_COMMUNITY)
Admission: RE | Admit: 2022-08-11 | Discharge: 2022-08-11 | Disposition: A | Discharge status: Home / Self Care | Payer: Managed Care, Other (non HMO) | Source: Ambulatory Visit | Attending: Gynecologic Oncology | Admitting: Gynecologic Oncology

## 2022-08-11 DIAGNOSIS — D3912 Neoplasm of uncertain behavior of left ovary: Secondary | ICD-10-CM | POA: Diagnosis present

## 2022-08-11 MED ORDER — IOHEXOL 300 MG/ML  SOLN
100.0000 mL | Freq: Once | INTRAMUSCULAR | Status: AC | PRN
  Administered 2022-08-11: 100 mL via INTRAVENOUS

## 2022-08-11 MED ORDER — IOHEXOL 9 MG/ML PO SOLN: 1000.0000 mL | Freq: Once | ORAL | Status: DC

## 2022-08-12 ENCOUNTER — Telehealth: Payer: Self-pay | Admitting: Gynecologic Oncology

## 2022-08-12 NOTE — Telephone Encounter (Signed)
Called patient, discussed CT results (no findings of metastatic disease). Plan for follow-up as scheduled in June. Reviewed symptoms that should prompt a phone call before her next visit.  Jeral Pinch MD Gynecologic Oncology

## 2022-09-09 ENCOUNTER — Other Ambulatory Visit: Payer: Self-pay | Admitting: Family Medicine

## 2022-09-09 DIAGNOSIS — Z1231 Encounter for screening mammogram for malignant neoplasm of breast: Secondary | ICD-10-CM

## 2022-10-23 ENCOUNTER — Emergency Department (HOSPITAL_COMMUNITY)
Admission: EM | Admit: 2022-10-23 | Discharge: 2022-10-23 | Disposition: A | Discharge status: Home / Self Care | Payer: Managed Care, Other (non HMO) | Attending: Emergency Medicine | Admitting: Emergency Medicine

## 2022-10-23 ENCOUNTER — Other Ambulatory Visit: Payer: Self-pay

## 2022-10-23 ENCOUNTER — Encounter (HOSPITAL_COMMUNITY): Payer: Self-pay

## 2022-10-23 DIAGNOSIS — Z8673 Personal history of transient ischemic attack (TIA), and cerebral infarction without residual deficits: Secondary | ICD-10-CM | POA: Insufficient documentation

## 2022-10-23 DIAGNOSIS — R42 Dizziness and giddiness: Secondary | ICD-10-CM | POA: Diagnosis present

## 2022-10-23 DIAGNOSIS — Z79899 Other long term (current) drug therapy: Secondary | ICD-10-CM | POA: Diagnosis not present

## 2022-10-23 DIAGNOSIS — E876 Hypokalemia: Secondary | ICD-10-CM | POA: Diagnosis not present

## 2022-10-23 DIAGNOSIS — I10 Essential (primary) hypertension: Secondary | ICD-10-CM | POA: Insufficient documentation

## 2022-10-23 LAB — CBC
HCT: 38 % (ref 36.0–46.0)
Hemoglobin: 11.9 g/dL — ABNORMAL LOW (ref 12.0–15.0)
MCH: 28.3 pg (ref 26.0–34.0)
MCHC: 31.3 g/dL (ref 30.0–36.0)
MCV: 90.3 fL (ref 80.0–100.0)
Platelets: 326 10*3/uL (ref 150–400)
RBC: 4.21 MIL/uL (ref 3.87–5.11)
RDW: 14.9 % (ref 11.5–15.5)
WBC: 8.4 10*3/uL (ref 4.0–10.5)
nRBC: 0 % (ref 0.0–0.2)

## 2022-10-23 LAB — BASIC METABOLIC PANEL
Anion gap: 14 (ref 5–15)
BUN: 20 mg/dL (ref 6–20)
CO2: 24 mmol/L (ref 22–32)
Calcium: 9.7 mg/dL (ref 8.9–10.3)
Chloride: 100 mmol/L (ref 98–111)
Creatinine, Ser: 0.89 mg/dL (ref 0.44–1.00)
GFR, Estimated: >60 mL/min (ref >60)
Glucose, Bld: 104 mg/dL — ABNORMAL HIGH (ref 70–99)
Potassium: 3.4 mmol/L — ABNORMAL LOW (ref 3.5–5.1)
Sodium: 138 mmol/L (ref 135–145)

## 2022-10-23 MED ORDER — IRBESARTAN 75 MG PO TABS
75.0000 mg | ORAL_TABLET | Freq: Every day | ORAL | Status: DC
  Administered 2022-10-23: 75 mg via ORAL
  Filled 2022-10-23: qty 1

## 2022-10-23 MED ORDER — POTASSIUM CHLORIDE CRYS ER 20 MEQ PO TBCR
40.0000 meq | EXTENDED_RELEASE_TABLET | Freq: Once | ORAL | Status: AC
  Administered 2022-10-23: 40 meq via ORAL
  Filled 2022-10-23: qty 2

## 2022-10-23 NOTE — Discharge Instructions (Addendum)
It was our pleasure to provide your ER care today - we hope that you feel better.  Continue your blood pressure meds, limit salt intake, follow heart health meal plan, and follow up with primary care doctor in the next 1-2 weeks for recheck.   From today's labs, your potassium level is slightly low (3.4) - eat plenty of fruits and vegetables, and follow up with primary care doctor.   Return to ER if worse, new symptoms, fevers, chest pain, trouble breathing, or other emergency concern.

## 2022-10-23 NOTE — ED Triage Notes (Signed)
Pt to ED via EMS from work with c/o HTN. Pt states that she was feeling dizzy and heart flutters. Per EMS pt was seen by medic at her job and her BP was 123456 systolic and [id number] diastolic. Pt has Hx of CVA. Pt reports she take valsartan and has been compliant with her meds. Upon arrival to ED pt reports improvement in symptoms.

## 2022-10-23 NOTE — ED Provider Notes (Signed)
[place] Provider Note   CSN: [id number] Arrival date & time: 10/23/22  1159     History  Chief Complaint  Patient presents with   Hypertension    Shari Gibson is a 57 y.o. female.  Patient with hx htn, c/o blood pressure being high at work. Hx htn, states takes bp med in AM, and did take today. No recent change in meds. States felt as if heart was palpating or skipping beat. No syncope. No hx dysrhythmia. No headache. No eye pain or change in vision. No problems w speech. No new numbness/weakness, or change in balance, coordination or normal functional ability. No chest pain/discomfort or sob, or unusual doe. No swelling.   The history is provided by the patient, medical records and the EMS personnel.  Hypertension Pertinent negatives include no chest pain, no abdominal pain, no headaches and no shortness of breath.       Home Medications Prior to Admission medications   Medication Sig Start Date End Date Taking? Authorizing Provider  acetaminophen (TYLENOL) 500 MG tablet Take 1,000 mg by mouth every 6 (six) hours as needed for mild pain (muscle aches).    [provider]  albuterol (PROVENTIL HFA;VENTOLIN HFA) 108 (90 Base) MCG/ACT inhaler Inhale 2 puffs into the lungs every 6 (six) hours as needed for wheezing or shortness of breath. 11/18/18   Henson, Vickie L, NP-C  budesonide-formoterol (SYMBICORT) 80-4.5 MCG/ACT inhaler Inhale 2 puffs into the lungs in the morning and at bedtime. 12/28/21   [provider]  diphenhydramine-acetaminophen (TYLENOL PM) 25-500 MG TABS tablet Take 1 tablet by mouth at bedtime as needed.    [provider]  EPINEPHrine 0.3 mg/0.3 mL IJ SOAJ injection Inject 0.3 mg into the muscle as needed for anaphylaxis. 01/28/22   Gaylan Gerold, DO  esomeprazole (NEXIUM) 20 MG capsule Take 20 mg by mouth daily.    [provider]  ibuprofen (ADVIL) 800 MG tablet Take 1 tablet  (800 mg total) by mouth every 8 (eight) hours as needed for moderate pain, cramping or mild pain. 07/08/22   Christophe Louis, MD  iron polysaccharides (NIFEREX) 150 MG capsule Take 1 capsule (150 mg total) by mouth daily. Patient taking differently: Take 150 mg by mouth every other day. 05/28/18   Raiford Noble Latif, DO  Multiple Vitamin (MULTIVITAMIN WITH MINERALS) TABS tablet Take 1 tablet by mouth daily. Takes every few days per pt on 06/30/22.    [provider]  valsartan (DIOVAN) 160 MG tablet Take 1 tablet (160 mg total) by mouth daily. 02/15/22 02/15/23  Virl Axe, MD      Allergies    Patient has no known allergies.    Review of Systems   Review of Systems  Constitutional:  Negative for fever.  HENT:  Negative for sore throat.   Eyes:  Negative for visual disturbance.  Respiratory:  Negative for cough and shortness of breath.   Cardiovascular:  Positive for palpitations. Negative for chest pain and leg swelling.  Gastrointestinal:  Negative for abdominal pain, diarrhea and vomiting.  Genitourinary:  Negative for flank pain.  Musculoskeletal:  Negative for back pain and neck pain.  Skin:  Negative for rash.  Neurological:  Negative for syncope, speech difficulty, weakness, numbness and headaches.  Hematological:  Does not bruise/bleed easily.  Psychiatric/Behavioral:  Negative for confusion.     Physical Exam Updated Vital Signs BP (!) 142/119   Pulse 94   Temp 98.1 F (  36.7 C) (Oral)   Resp 18   Ht 1.676 m ('5\' 6"'$ )   Wt 99 kg   SpO2 100%   BMI 35.23 kg/m  Physical Exam Vitals and nursing note reviewed.  Constitutional:      Appearance: Normal appearance. She is well-developed.  HENT:     Head: Atraumatic.     Nose: Nose normal.     Mouth/Throat:     Mouth: Mucous membranes are moist.  Eyes:     General: No scleral icterus.    Conjunctiva/sclera: Conjunctivae normal.     Pupils: Pupils are equal, round, and reactive to light.  Neck:     Trachea: No  tracheal deviation.     Comments: Trachea midline. Thyroid not grossly enlarged or tender. No bruit.  Cardiovascular:     Rate and Rhythm: Normal rate and regular rhythm.     Pulses: Normal pulses.     Heart sounds: Normal heart sounds. No murmur heard.    No friction rub. No gallop.  Pulmonary:     Effort: Pulmonary effort is normal. No respiratory distress.     Breath sounds: Normal breath sounds.  Abdominal:     General: There is no distension.     Palpations: Abdomen is soft.     Tenderness: There is no abdominal tenderness.  Musculoskeletal:        General: No swelling or tenderness.     Cervical back: Normal range of motion and neck supple. No rigidity or tenderness. No muscular tenderness.     Right lower leg: No edema.     Left lower leg: No edema.  Skin:    General: Skin is warm and dry.     Findings: No rash.  Neurological:     Mental Status: She is alert.     Comments: Alert, speech normal. Motor/sens grossly intact bil.   Psychiatric:        Mood and Affect: Mood normal.     ED Results / Procedures / Treatments   Labs (all labs ordered are listed, but only abnormal results are displayed) Results for orders placed or performed during the hospital encounter of 10/23/22  CBC  Result Value Ref Range   WBC 8.4 4.0 - 10.5 K/uL   RBC 4.21 3.87 - 5.11 MIL/uL   Hemoglobin 11.9 (L) 12.0 - 15.0 g/dL   HCT 38.0 36.0 - 46.0 %   MCV 90.3 80.0 - 100.0 fL   MCH 28.3 26.0 - 34.0 pg   MCHC 31.3 30.0 - 36.0 g/dL   RDW 14.9 11.5 - 15.5 %   Platelets 326 150 - 400 K/uL   nRBC 0.0 0.0 - 0.2 %  Basic metabolic panel  Result Value Ref Range   Sodium 138 135 - 145 mmol/L   Potassium 3.4 (L) 3.5 - 5.1 mmol/L   Chloride 100 98 - 111 mmol/L   CO2 24 22 - 32 mmol/L   Glucose, Bld 104 (H) 70 - 99 mg/dL   BUN 20 6 - 20 mg/dL   Creatinine, Ser 0.89 0.44 - 1.00 mg/dL   Calcium 9.7 8.9 - 10.3 mg/dL   GFR, Estimated >60 >60 mL/min   Anion gap 14 5 - 15    EKG EKG  Interpretation  Date/Time:  Friday October 23 2022 12:05:54 EST Ventricular Rate:  95 PR Interval:  163 QRS Duration: 68 QT Interval:  346 QTC Calculation: 435 R Axis:   -21 Text Interpretation: Sinus rhythm No significant change since last tracing Confirmed  by Lajean Saver [phone]) on 10/23/2022 2:01:44 PM  Radiology No results found.  Procedures Procedures    Medications Ordered in ED Medications  potassium chloride SA (KLOR-CON M) CR tablet 40 mEq (has no administration in time range)  irbesartan (AVAPRO) tablet 75 mg (has no administration in time range)    ED Course/ Medical Decision Making/ A&P                             Medical Decision Making Problems Addressed: Essential hypertension: chronic illness or injury with exacerbation, progression, or side effects of treatment that poses a threat to life or bodily functions Hypokalemia: acute illness or injury Uncontrolled hypertension: acute illness or injury with systemic symptoms that poses a threat to life or bodily functions  Amount and/or Complexity of Data Reviewed Independent Historian: EMS    Details: hx External Data Reviewed: notes. Labs: ordered. Decision-making details documented in ED Course. ECG/medicine tests: ordered and independent interpretation performed. Decision-making details documented in ED Course.  Risk Prescription drug management. Decision regarding hospitalization.   Iv ns. Continuous pulse ox and cardiac monitoring. Labs ordered/sent.  Differential diagnosis includes  uncontrolled htn, aki, dehydration, anemia, etc . Dispo decision including potential need for admission considered - will get labs  and reassess.   Reviewed nursing notes and prior charts for additional history. External reports reviewed. Additional history from: EMS.  Note initial bp (201/166) not accurate as bp cuff noted not on correctly, I adjusted and immediate repeat was 178/88.   Cardiac monitor: sinus rhythm,  rate 94.  Labs reviewed/interpreted by me - k sl low. Wbc normal. Hct 38, normal. Kcl po.   Pt given incremental dose of her bp med in ED.  Bp is improved. No current symptoms.   Pt appears stable for d/c.  Return precautions provided.            Final Clinical Impression(s) / ED Diagnoses Final diagnoses:  Essential hypertension  Uncontrolled hypertension  Hypokalemia    Rx / DC Orders ED Discharge Orders     None         Lajean Saver, MD 10/23/22 1402

## 2022-10-29 ENCOUNTER — Ambulatory Visit: Payer: Managed Care, Other (non HMO)

## 2022-10-30 ENCOUNTER — Ambulatory Visit: Payer: Managed Care, Other (non HMO)

## 2022-12-15 ENCOUNTER — Ambulatory Visit
Admission: RE | Admit: 2022-12-15 | Discharge: 2022-12-15 | Disposition: A | Discharge status: Home / Self Care | Payer: Managed Care, Other (non HMO) | Source: Ambulatory Visit | Attending: Family Medicine | Admitting: Family Medicine

## 2022-12-15 DIAGNOSIS — Z1231 Encounter for screening mammogram for malignant neoplasm of breast: Secondary | ICD-10-CM

## 2023-01-13 ENCOUNTER — Encounter (HOSPITAL_BASED_OUTPATIENT_CLINIC_OR_DEPARTMENT_OTHER): Payer: Self-pay

## 2023-01-14 ENCOUNTER — Institutional Professional Consult (permissible substitution) (HOSPITAL_BASED_OUTPATIENT_CLINIC_OR_DEPARTMENT_OTHER): Payer: Managed Care, Other (non HMO) | Admitting: Cardiovascular Disease

## 2023-01-14 ENCOUNTER — Encounter (HOSPITAL_BASED_OUTPATIENT_CLINIC_OR_DEPARTMENT_OTHER): Payer: Self-pay

## 2023-01-21 NOTE — Progress Notes (Signed)
Gynecologic Oncology Return Clinic Visit  01/22/23  Reason for Visit: surveillance  Treatment History: The patient reports a change in bleeding in 2022-2023. She would have months with no menses and then have some menses that would last for 8-21 days. When she bled, menses were often quite heavy with passage of clots. She required multiple transfusions in the year prior to surgery. She denies any change in weight during this period. She describes having had brief, intermittent episodes of a sharp contraction-like pain in her vagina.   Pelvic ultrasound exam at [facility] on 03/25/2022 revealed a uterus measuring 16.7 x 11.1 x 10.1 cm.  Multiple fibroids noted measuring up to 4.6 cm.  Ovary normal in appearance, left ovary not visualized but a left hypoechoic solid mass measuring 7 x 6 x 5.9 cm noted with some blood flow around the mass.  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  Endometrial biopsy in mid August revealed proliferative endometrium, no hyperplasia or malignancy.   Preoperative labs included a normal CA125 (8.3).   On 06/25/22, the patient underwent robotic total hysterectomy and BSO.  This was performed in the setting of uterine fibroids, abnormal uterine bleeding, and an adnexal mass.  Findings at the time of surgery included an 18-week size uterus with, normal right ovary, left ovary with 7 cm mass.  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  Specimen was received disrupted but no surface involvement noted.  Pelvic washings were negative.  07/24/22: Inhibin B < 7, AMH 1.07  CT A/P on 08/11/22: No acute process or evidence of metastatic disease in the abdomen or pelvis.  Hepatomegaly.  Interval History: The patient reports overall doing well.  She had 1 episode of vaginal bleeding several weeks ago, otherwise denies any vaginal bleeding or discharge.  Endorses  baseline bowel bladder function.  She denies any abdominal or pelvic pain.  Past Medical/Surgical History: Past Medical History:  Diagnosis Date   Anemia 01/14/2017   hx of blood transfusion on 02/16/22   Asthma    see 05/11/22 PFT in Epic, follows with Dr. Durel Salts at [facility], LOV 05/11/22 in Epic as of 06/24/22.   Fibroids    GERD (gastroesophageal reflux disease)    Follows with [facility], LOV 04/28/22 w/ Eliott Nine, PA as of 06/24/22.   Granulosa cell tumor of left ovary    Herpes    never had symptoms   History of hiatal hernia 05/19/2022   1 cm hiatal hernia found during EGD   Hyperlipidemia    Hypertension    Follows w/ Dr. Chanetta Marshall at [facility].   TIA (transient ischemic attack) 01/27/2022   Possible TIA vs food allergy. Pt experienced ataxia, slurred speech, perioral numbness and tingling while eating breakfast. CT and MRI of head were negative for a stroke. See 01/27/22 ED visit in Epic. Patient follows w/ [facility], LOV 05/21/22 w/ Ihor Austin, NP (as of 06/24/22.)   Wears glasses     Past Surgical History:  Procedure Laterality Date   BREAST BIOPSY Bilateral    COLONOSCOPY  02/28/2020   [facility]. Dr. Charna Elizabeth. The entire portion of the colon appeared normal.   ROBOTIC ASSISTED LAPAROSCOPIC HYSTERECTOMY AND SALPINGECTOMY Bilateral 07/07/2022   Procedure: XI ROBOTIC ASSISTED LAPAROSCOPIC HYSTERECTOMY AND BILATERAL SALPINGO-OOPHORECTOMY;  Surgeon: Gerald Leitz, MD;  Location: [facility];  Service:  Gynecology;  Laterality: Bilateral;   TUBAL LIGATION  1991   UPPER GASTROINTESTINAL ENDOSCOPY  2023    Family History  Adopted: Yes  Problem Relation Age of Onset   Breast cancer Mother        in 67's   Diabetes Mother    Hypertension Mother    Asthma Daughter    Diabetes Maternal Aunt    Hypertension Maternal Aunt    Alzheimer's disease Maternal Grandmother    Lung disease Neg Hx      Social History   Socioeconomic History   Marital status: Single    Spouse name: Not on file   Number of children: 3   Years of education: Not on file   Highest education level: Not on file  Occupational History   Not on file  Tobacco Use   Smoking status: Former    Packs/day: 1.00    Years: 20.00    Additional pack years: 0.00    Total pack years: 20.00    Types: Cigarettes    Quit date: 03/18/2015    Years since quitting: 7.8    Passive exposure: Never   Smokeless tobacco: Never  Vaping Use   Vaping Use: Never used  Substance and Sexual Activity   Alcohol use: Yes    Comment: about 4 drinks per week   Drug use: No   Sexual activity: Not Currently    Birth control/protection: Surgical    Comment: tubal ligation  Other Topics Concern   Not on file  Social History Narrative   ** Merged History Encounter **       Social Determinants of Health   Financial Resource Strain: Not on file  Food Insecurity: Not on file  Transportation Needs: Not on file  Physical Activity: Not on file  Stress: Not on file  Social Connections: Not on file    Current Medications:  Current Outpatient Medications:    albuterol (PROVENTIL HFA;VENTOLIN HFA) 108 (90 Base) MCG/ACT inhaler, Inhale 2 puffs into the lungs every 6 (six) hours as needed for wheezing or shortness of breath., Disp: 1 Inhaler, Rfl: 1   esomeprazole (NEXIUM) 20 MG capsule, Take 20 mg by mouth daily., Disp: , Rfl:    Multiple Vitamin (MULTIVITAMIN WITH MINERALS) TABS tablet, Take 1 tablet by mouth daily. Takes every few days per pt on 06/30/22., Disp: , Rfl:    spironolactone (ALDACTONE) 25 MG tablet, Take 25 mg by mouth daily., Disp: , Rfl:    traZODone (DESYREL) 50 MG tablet, Take 50 mg by mouth at bedtime as needed., Disp: , Rfl:    valsartan (DIOVAN) 320 MG tablet, Take 320 mg by mouth daily., Disp: , Rfl:   Review of Systems: Denies appetite changes, fevers, chills, fatigue, unexplained weight changes. Denies  hearing loss, neck lumps or masses, mouth sores, ringing in ears or voice changes. Denies cough or wheezing.  Denies shortness of breath. Denies chest pain or palpitations. Denies leg swelling. Denies abdominal distention, pain, blood in stools, constipation, diarrhea, nausea, vomiting, or early satiety. Denies pain with intercourse, dysuria, frequency, hematuria or incontinence. Denies hot flashes, pelvic pain, vaginal bleeding or vaginal discharge.   Denies joint pain, back pain or muscle pain/cramps. Denies itching, rash, or wounds. Denies dizziness, headaches, numbness or seizures. Denies swollen lymph nodes or glands, denies easy bruising or bleeding. Denies anxiety, depression, confusion, or decreased concentration.  Physical Exam: BP (!) 165/77 (BP Location: Left Arm, Patient Position: Sitting)   Pulse 82   Temp (!) 97  F (36.1 C)   Resp 16   Ht 5' 7.72" (1.72 m)   Wt 209 lb 3.2 oz (94.9 kg)   SpO2 100%   BMI 32.08 kg/m  General: Alert, oriented, no acute distress.  HEENT: Normocephalic, atraumatic. Sclera anicteric.  Chest: Clear to auscultation bilaterally. No wheezes, rhonchi, or rales. Cardiovascular: Regular rate and rhythm, no murmurs, rubs, or gallops.  Abdomen: Obese. Normoactive bowel sounds. Soft, nondistended, nontender to palpation. No masses or hepatosplenomegaly appreciated. No palpable fluid wave.  Well healed incisions. Extremities: Grossly normal range of motion. Warm, well perfused. No edema bilaterally.  Skin: No rashes or lesions.  Lymphatics: No cervical, supraclavicular, or inguinal adenopathy.  GU: Normal-appearing external female genitalia.  Speculum exam reveals well rugated vaginal mucosa.  Cuff intact.  Small amount of older appearing blood within the vaginal vault, removed with scopettes.  No active bleeding noted.  No visible lesions.  On bimanual exam, no masses or nodularity appreciated.  Laboratory & Radiologic Studies: None new  Assessment &  Plan: ASYIA CIHLAR is a 57 y.o. 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 CT A/P post-op in 07/2022 negative for metastatic disease.  Patient is overall doing well, NED on exam today.  Some older appearing blood was noted in the vagina with no obvious source or ongoing bleeding noted.  Plan for Inhibin B today.  In terms of surveillance, given low-risk disease, we discussed NCCN surveillance recommendations for visits every 6 months. I think these can be alternating between my office and the patient's OBGYN. Her next visit will be with her OBGYN. I will see her back in 1 year. We will plan for an exam and tumor markers at each visit.   20 minutes of total time was spent for this patient encounter, including preparation, face-to-face counseling with the patient and coordination of care, and documentation of the encounter.  Eugene Garnet, MD  Division of Gynecologic Oncology  Department of Obstetrics and Gynecology  [facility]

## 2023-01-22 ENCOUNTER — Encounter: Payer: Self-pay | Admitting: Gynecologic Oncology

## 2023-01-22 ENCOUNTER — Other Ambulatory Visit: Payer: Self-pay

## 2023-01-22 ENCOUNTER — Inpatient Hospital Stay: Payer: Managed Care, Other (non HMO)

## 2023-01-22 ENCOUNTER — Inpatient Hospital Stay: Payer: Managed Care, Other (non HMO) | Attending: Gynecologic Oncology | Admitting: Gynecologic Oncology

## 2023-01-22 VITALS — BP 165/77 | HR 82 | Temp 97.0°F | Resp 16 | Ht 67.72 in | Wt 209.2 lb

## 2023-01-22 DIAGNOSIS — Z803 Family history of malignant neoplasm of breast: Secondary | ICD-10-CM | POA: Insufficient documentation

## 2023-01-22 DIAGNOSIS — Z87891 Personal history of nicotine dependence: Secondary | ICD-10-CM | POA: Diagnosis not present

## 2023-01-22 DIAGNOSIS — Z90710 Acquired absence of both cervix and uterus: Secondary | ICD-10-CM | POA: Diagnosis not present

## 2023-01-22 DIAGNOSIS — D3912 Neoplasm of uncertain behavior of left ovary: Secondary | ICD-10-CM | POA: Insufficient documentation

## 2023-01-22 DIAGNOSIS — Z90722 Acquired absence of ovaries, bilateral: Secondary | ICD-10-CM | POA: Diagnosis not present

## 2023-01-22 NOTE — Patient Instructions (Signed)
It was good to see you today.  I do not see or feel any evidence of cancer recurrence on your exam.  I will see you for follow-up in 12 months.  You will see your OB/GYN in 6 months.  As always, if you develop any new and concerning symptoms before your next visit, please call to see me sooner.

## 2023-01-25 LAB — INHIBIN B: Inhibin B: <7 pg/mL

## 2023-01-28 ENCOUNTER — Telehealth: Payer: Self-pay | Admitting: Internal Medicine

## 2023-01-28 NOTE — Telephone Encounter (Signed)
Patient sent Mychart message to front staff stating that her insurance would no longer cover symbicort. She is asking for the gneric of that prescription if possible. Please advise.

## 2023-01-28 NOTE — Telephone Encounter (Signed)
Per test claim, Symbicort (brand) is able to be filled with a co-pay of $50.00

## 2023-02-02 ENCOUNTER — Encounter: Payer: Self-pay | Admitting: *Deleted

## 2023-02-02 NOTE — Telephone Encounter (Signed)
Mychart msg has been sent with pharm team recommendations. Closing encounter.

## 2023-02-03 ENCOUNTER — Encounter (HOSPITAL_BASED_OUTPATIENT_CLINIC_OR_DEPARTMENT_OTHER): Payer: Self-pay | Admitting: Cardiovascular Disease

## 2023-02-03 NOTE — Telephone Encounter (Signed)
Pt is asking where can she get the Symbicort for $50 copay   Can you please help? I am not sure if it has to go to specific pharmacy bc she still is stating insurance will not cover   Per pt:  Where or how can I get this prescription filled when my insurance will not cover for that price?

## 2023-02-04 ENCOUNTER — Other Ambulatory Visit (HOSPITAL_COMMUNITY): Payer: Self-pay

## 2023-02-04 MED ORDER — BUDESONIDE-FORMOTEROL FUMARATE 80-4.5 MCG/ACT IN AERO
2.0000 | INHALATION_SPRAY | Freq: Two times a day (BID) | RESPIRATORY_TRACT | 5 refills | Status: DC
  Filled 2023-02-04: qty 1, fill #0
  Filled 2023-02-19 – 2023-03-19 (×2): qty 10.2, 30d supply, fill #0

## 2023-02-04 NOTE — Telephone Encounter (Signed)
These prices are for [facility] pharmacies!

## 2023-02-19 ENCOUNTER — Other Ambulatory Visit (HOSPITAL_COMMUNITY): Payer: Self-pay

## 2023-03-20 ENCOUNTER — Other Ambulatory Visit (HOSPITAL_COMMUNITY): Payer: Self-pay

## 2023-04-15 ENCOUNTER — Ambulatory Visit (INDEPENDENT_AMBULATORY_CARE_PROVIDER_SITE_OTHER): Payer: Managed Care, Other (non HMO) | Admitting: Family

## 2023-04-15 ENCOUNTER — Telehealth: Payer: Self-pay | Admitting: Licensed Clinical Social Worker

## 2023-04-15 ENCOUNTER — Encounter (HOSPITAL_BASED_OUTPATIENT_CLINIC_OR_DEPARTMENT_OTHER): Payer: Self-pay | Admitting: Family

## 2023-04-15 VITALS — BP 146/83 | HR 74 | Ht 67.0 in | Wt 213.6 lb

## 2023-04-15 DIAGNOSIS — Z006 Encounter for examination for normal comparison and control in clinical research program: Secondary | ICD-10-CM

## 2023-04-15 DIAGNOSIS — I10 Essential (primary) hypertension: Secondary | ICD-10-CM

## 2023-04-15 DIAGNOSIS — Z789 Other specified health status: Secondary | ICD-10-CM | POA: Diagnosis not present

## 2023-04-15 MED ORDER — AMLODIPINE BESYLATE 5 MG PO TABS: 5.0000 mg | ORAL_TABLET | Freq: Every day | ORAL | 3 refills | Status: DC

## 2023-04-15 NOTE — Progress Notes (Signed)
Advanced Hypertension Clinic Initial Assessment:    Date:  04/15/2023   ID:  Shari Gibson, DOB Oct 13, 1965, MRN [id number]  PCP:  Shon Hale, MD  Cardiologist:  None  Nephrologist:  Referring MD: Shon Hale, *   CC: Hypertension  History of Present Illness:    Shari Gibson is a 57 y.o. 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   Prior testing: Echocardiogram 01/2022 normal LVEF 65 to 70%, no R WMA, trivial MR.   CT angio head neck 01/2022 aortic arch unremarkable, right carotid <50% steenosis, L carotid partially retropharyngeal course with no stenosis  CT abdomen pelvis with contrast 07/2022 with normal adrenal glands.  Admitted 01/2022 with TIA vs food allergy. Discharged on ASA/Plavix for 3 weeks and Rosuvastatin.   ED visit 10/23/2022 with palpitations, hypertension with BP 178/88.  She was hypokalemic which was repleted.  PCP has increased valsartan and initiated spironolactone.  Shari Gibson was diagnosed with hypertension within the last year. It has been difficult to control. Blood pressure checked with arm cuff at home. Readings have been checked very intermittently 175/100-190/105. When her blood pressure is high she feels palpitations and dizziness.  she reports tobacco use previously having quit in 2016. Alcohol use with 2 beers 4 times per week.  Previously drinking liquor 5 times per week and has cut back significantly.  For exercise she has a gym membership and likes to lift weights, walks on her treadmill. she eats at home and does follow low sodium diet.  Her grandson has told her that she snores sometimes but she does wake feeling well rested.  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  PCP previously adjusted antihypertensive regimen as followed : Initiated  valsartan ? dose increased ? Spironolactone added ? Amlodipine added.   Labs via [facility]: 01/2020 4 TSH 0.58 02/11/2023 total cholesterol 199, HDL 94, LDL 75, triglycerides 171 11/23/8117 creatinine 0.61, GFR 105, K 4.5, Na 140  Previous antihypertensives: Valsartan-hydrochlorothiazide - hypokalemia   Past Medical History:  Diagnosis Date   Anemia 01/14/2017   hx of blood transfusion on 02/16/22   Asthma    see 05/11/22 PFT in Epic, follows with Dr. Durel Salts at [facility], LOV 05/11/22 in Epic as of 06/24/22.   Fibroids    GERD (gastroesophageal reflux disease)    Follows with [facility], LOV 04/28/22 w/ Eliott Nine, PA as of 06/24/22.   Granulosa cell tumor of left ovary    Herpes    never had symptoms   History of hiatal hernia 05/19/2022   1 cm hiatal hernia found during EGD   Hyperlipidemia    Hypertension    Follows w/ Dr. Chanetta Marshall at [facility]   TIA (transient ischemic attack) 01/27/2022   Possible TIA vs food allergy. Pt experienced ataxia, slurred speech, perioral numbness and tingling while eating breakfast. CT and MRI of head were negative for a stroke. See 01/27/22 ED visit in Epic. Patient follows w/ [facility], LOV 05/21/22 w/ Ihor Austin, NP (as of 06/24/22.)   Wears glasses     Past Surgical History:  Procedure Laterality Date   BREAST BIOPSY Bilateral    COLONOSCOPY  02/28/2020   [facility]. Dr. Charna Elizabeth. The entire portion of the colon appeared normal.   ROBOTIC ASSISTED LAPAROSCOPIC HYSTERECTOMY AND SALPINGECTOMY Bilateral  07/07/2022   Procedure: XI ROBOTIC ASSISTED LAPAROSCOPIC HYSTERECTOMY AND BILATERAL SALPINGO-OOPHORECTOMY;  Surgeon: Gerald Leitz, MD;  Location: [facility];  Service: Gynecology;  Laterality: Bilateral;   TUBAL LIGATION  1991   UPPER GASTROINTESTINAL ENDOSCOPY  2023    Current Medications: Current Meds  Medication Sig   albuterol (PROVENTIL HFA;VENTOLIN HFA) 108  (90 Base) MCG/ACT inhaler Inhale 2 puffs into the lungs every 6 (six) hours as needed for wheezing or shortness of breath.   esomeprazole (NEXIUM) 20 MG capsule Take 20 mg by mouth daily.   spironolactone (ALDACTONE) 25 MG tablet Take 25 mg by mouth daily.   traZODone (DESYREL) 50 MG tablet Take 50 mg by mouth at bedtime as needed.   valsartan (DIOVAN) 320 MG tablet Take 320 mg by mouth daily.   [DISCONTINUED] amLODipine (NORVASC) 5 MG tablet Take 5 mg by mouth daily.     Allergies:   Patient has no known allergies.   Social History   Socioeconomic History   Marital status: Single    Spouse name: Not on file   Number of children: 3   Years of education: Not on file   Highest education level: Not on file  Occupational History   Not on file  Tobacco Use   Smoking status: Former    Current packs/day: 0.00    Average packs/day: 1 pack/day for 20.0 years (20.0 ttl pk-yrs)    Types: Cigarettes    Start date: 03/18/1995    Quit date: 03/18/2015    Years since quitting: 8.0    Passive exposure: Never   Smokeless tobacco: Never  Vaping Use   Vaping status: Never Used  Substance and Sexual Activity   Alcohol use: Yes    Comment: about 4 drinks per week   Drug use: No   Sexual activity: Not Currently    Birth control/protection: Surgical    Comment: tubal ligation  Other Topics Concern   Not on file  Social History Narrative   ** Merged History Encounter **       Social Determinants of Health   Financial Resource Strain: Not on file  Food Insecurity: No Food Insecurity (04/15/2023)   Hunger Vital Sign    Worried About Running Out of Food in the Last Year: Never true    Ran Out of Food in the Last Year: Never true  Transportation Needs: No Transportation Needs (04/15/2023)   PRAPARE - Administrator, Civil Service (Medical): No    Lack of Transportation (Non-Medical): No  Physical Activity: Inactive (04/15/2023)   Exercise Vital Sign    Days of Exercise per Week: 0  days    Minutes of Exercise per Session: 0 min  Stress: Not on file  Social Connections: Unknown (12/30/2021)   Received from [facility]   Social Network    Social Network: Not on file     Family History: The patient's family history includes Alzheimer's disease in her maternal grandmother; Asthma in her daughter; Breast cancer in her mother; Diabetes in her maternal aunt and mother; Hypertension in her maternal aunt and mother. There is no history of Lung disease. She was adopted.  ROS:   Please see the history of present illness.     All other systems reviewed and are negative.  EKGs/Labs/Other Studies Reviewed:         Recent Labs: 10/23/2022: BUN 20; Creatinine, Ser 0.89; Hemoglobin 11.9; Platelets 326; Potassium 3.4; Sodium 138   Recent Lipid Panel  Component Value Date/Time   CHOL 173 01/27/2022 2146   TRIG 144 01/27/2022 2146   HDL 69 01/27/2022 2146   CHOLHDL 2.5 01/27/2022 2146   VLDL 29 01/27/2022 2146   LDLCALC 75 01/27/2022 2146    Physical Exam:   VS:  BP (!) 146/83 (BP Location: Left Arm, Patient Position: Sitting, Cuff Size: Normal)   Pulse 74   Ht 5\' 7"  (1.702 m)   Wt 213 lb 9.6 oz (96.9 kg)   SpO2 98%   BMI 33.45 kg/m  , BMI Body mass index is 33.45 kg/m. GENERAL:  Well appearing, overweight HEENT: Pupils equal round and reactive, fundi not visualized, oral mucosa unremarkable NECK:  No jugular venous distention, waveform within normal limits, carotid upstroke brisk and symmetric, no bruits, no thyromegaly LYMPHATICS:  No cervical adenopathy LUNGS:  Clear to auscultation bilaterally HEART:  RRR.  PMI not displaced or sustained,S1 and S2 within normal limits, no S3, no S4, no clicks, no rubs, no murmurs ABD:  Flat, positive bowel sounds normal in frequency in pitch, no bruits, no rebound, no guarding, no midline pulsatile mass, no hepatomegaly, no splenomegaly EXT:  2 plus pulses throughout, no edema, no cyanosis no clubbing SKIN:  No rashes no  nodules NEURO:  Cranial nerves II through XII grossly intact, motor grossly intact throughout PSYCH:  Cognitively intact, oriented to person place and time   ASSESSMENT/PLAN:    HTN -BP not at goal less than 130/80.  Has been out of her amlodipine for a couple of weeks.  Refill amlodipine 5 mg daily.  Continue spironolactone 25 mg daily, valsartan 320 mg daily.  Check in via MyChart in 1 week and if BP not at goal plan to increase dose of amlodipine. Renal duplex to rule out stenosis Labs today cortisol, renin-aldosterone. [facility] RPM research study today and BP cuff provided.  Snores-notes her grandson tells her she snores occasionally but wakes feeling well rested and no daytime somnolence.  As such, will defer sleep study for now.  If BP persistently difficult to control  Etoh use -previously drinking liquor 5 times a week and has cut back to drinking 2 beers 4 times per week.  Plans to continue decreasing.  Plans to continue gradually reducing.  Has previously been given AA resources by PCP.  Screening for Secondary Hypertension:     04/15/2023    1:00 PM  Causes  Renovascular HTN Screened  Thyroid Disease Screened  Pheochromocytoma Screened  Cushing's Syndrome Screened  Coarctation of the Aorta Screened     - Comments BP symmetrical  Compliance Screened    Relevant Labs/Studies:    Latest Ref Rng & Units 10/23/2022   12:05 PM 07/06/2022    8:24 AM 02/15/2022    1:45 AM  Basic Labs  Sodium 135 - 145 mmol/L 138  137  140   Potassium 3.5 - 5.1 mmol/L 3.4  3.6  3.8   Creatinine 0.44 - 1.00 mg/dL 1.02  7.25  3.66        Latest Ref Rng & Units 06/09/2018   10:20 AM 01/14/2017    8:45 AM  Thyroid   TSH 0.450 - 4.500 uIU/mL 0.914  0.87                 04/15/2023    8:46 AM  Renovascular   Renal Artery [place] Completed Yes      she consents to be monitored in our remote patient monitoring program through [employer].  she will track his  blood pressure twice daily and understands  that these trends will help [place] to adjust her medications as needed prior to his next appointment.   Disposition:    FU with MD/PharmD in 2 months    Medication Adjustments/Labs and Tests Ordered: Current medicines are reviewed at length with the patient today.  Concerns regarding medicines are outlined above.  Orders Placed This Encounter  Procedures   Cortisol   Aldosterone + renin activity w/ ratio   Referral to HRT/VAS Care Navigation   Cantril's Ladder Assessment   VAS US RENAL ARTERY DUPLEX   Meds ordered this encounter  Medications   amLODipine (NORVASC) 5 MG tablet    Sig: Take 1 tablet (5 mg total) by mouth daily.    Dispense:  90 tablet    Refill:  3    Order Specific Question:   Supervising Provider    Answer:   Jodelle Red [[id number]]     Signed, Alver Sorrow, NP  04/15/2023 1:00 PM    [facility]

## 2023-04-15 NOTE — Research (Unsigned)
  Subject Name: Shari Gibson met inclusion and exclusion criteria for the Virtual Care and Social Determinant Interventions for the management of hypertension trial.  The informed consent form, study requirements and expectations were reviewed with the subject by Dr. Duke Salvia and myself. The subject was given the opportunity to read the consent and ask questions. The subject verbalized understanding of the trial requirements.  All questions were addressed prior to the signing of the consent form. The subject agreed to participate in the trial and signed the informed consent. The informed consent was obtained prior to performance of any protocol-specific procedures for the subject.  A copy of the signed informed consent was given to the subject and a copy was placed in the subject's medical record.  Shari Gibson was randomized to Group 2.

## 2023-04-15 NOTE — Telephone Encounter (Addendum)
H&V Care Navigation CSW Progress Note  Clinical Social Worker contacted patient by phone to f/u on community resources for ETOH cessation. No answer this afternoon at [phone], I left voicemail requesting return call as able. Will re-attempt.   Patient is participating in a Managed Medicaid Plan:  No, [employer] commercial only  SDOH Screenings   Food Insecurity: No Food Insecurity (04/15/2023)  Housing: Low Risk  (04/15/2023)  Transportation Needs: No Transportation Needs (04/15/2023)  Utilities: Not At Risk (04/15/2023)  Alcohol Screen: Low Risk  (04/15/2023)  Physical Activity: Inactive (04/15/2023)  Social Connections: Unknown (12/30/2021)   Received from [facility]  Tobacco Use: Medium Risk (04/15/2023)   Octavio Graves, MSW, LCSW Clinical Social Worker II [facility] Health Heart/Vascular Care Navigation  [phone]- work cell phone (preferred) [phone]- desk phone

## 2023-04-15 NOTE — Patient Instructions (Addendum)
Medication Instructions:  Your physician recommends that you continue on your current medications as directed. Please refer to the Current Medication list given to you today.  We have refilled your amlodipine 5mg  today!    Labwork: Your physician recommends that you return for lab work today- Cortisol & renin- aldosterone    Testing/Procedures: Your physician has requested that you have a renal artery duplex. During this test, an ultrasound is used to evaluate blood flow to the kidneys. Allow one hour for this exam. Do not eat after midnight the day before and avoid carbonated beverages. Take your medications as you usually do.    Follow-Up:  2 month: 10/10 at 2:45pm with Gillian Shields, NP   4 month: 12/5 at 10:55am with Gillian Shields, NP     Referrals:  We have referred your to Amy, she will be in touch to get you started.    Special Instructions:  DASH Eating Plan DASH stands for Dietary Approaches to Stop Hypertension. The DASH eating plan is a healthy eating plan that has been shown to: Lower high blood pressure (hypertension). Reduce your risk for type 2 diabetes, heart disease, and stroke. Help with weight loss. What are tips for following this plan? Reading food labels Check food labels for the amount of salt (sodium) per serving. Choose foods with less than 5 percent of the Daily Value (DV) of sodium. In general, foods with less than 300 milligrams (mg) of sodium per serving fit into this eating plan. To find whole grains, look for the word "whole" as the first word in the ingredient list. Shopping Buy products labeled as "low-sodium" or "no salt added." Buy fresh foods. Avoid canned foods and pre-made or frozen meals. Cooking Try not to add salt when you cook. Use salt-free seasonings or herbs instead of table salt or sea salt. Check with your health care provider or pharmacist before using salt substitutes. Do not fry foods. Cook foods in healthy ways, such as baking,  boiling, grilling, roasting, or broiling. Cook using oils that are good for your heart. These include olive, canola, avocado, soybean, and sunflower oil. Meal planning  Eat a balanced diet. This should include: 4 or more servings of fruits and 4 or more servings of vegetables each day. Try to fill half of your plate with fruits and vegetables. 6-8 servings of whole grains each day. 6 or less servings of lean meat, poultry, or fish each day. 1 oz is 1 serving. A 3 oz (85 g) serving of meat is about the same size as the palm of your hand. One egg is 1 oz (28 g). 2-3 servings of low-fat dairy each day. One serving is 1 cup (237 mL). 1 serving of nuts, seeds, or beans 5 times each week. 2-3 servings of heart-healthy fats. Healthy fats called omega-3 fatty acids are found in foods such as walnuts, flaxseeds, fortified milks, and eggs. These fats are also found in cold-water fish, such as sardines, salmon, and mackerel. Limit how much you eat of: Canned or prepackaged foods. Food that is high in trans fat, such as fried foods. Food that is high in saturated fat, such as fatty meat. Desserts and other sweets, sugary drinks, and other foods with added sugar. Full-fat dairy products. Do not salt foods before eating. Do not eat more than 4 egg yolks a week. Try to eat at least 2 vegetarian meals a week. Eat more home-cooked food and less restaurant, buffet, and fast food. Lifestyle When eating at a  restaurant, ask if your food can be made with less salt or no salt. If you drink alcohol: Limit how much you have to: 0-1 drink a day if you are female. 0-2 drinks a day if you are female. Know how much alcohol is in your drink. In the [place] one drink is one 12 oz bottle of beer (355 mL), one 5 oz glass of wine (148 mL), or one 1 oz glass of hard liquor (44 mL). General information Avoid eating more than 2,300 mg of salt a day. If you have hypertension, you may need to reduce your sodium intake to 1,500  mg a day. Work with your provider to stay at a healthy body weight or lose weight. Ask what the best weight range is for you. On most days of the week, get at least 30 minutes of exercise that causes your heart to beat faster. This may include walking, swimming, or biking. Work with your provider or dietitian to adjust your eating plan to meet your specific calorie needs. What foods should I eat? Fruits All fresh, dried, or frozen fruit. Canned fruits that are in their natural juice and do not have sugar added to them. Vegetables Fresh or frozen vegetables that are raw, steamed, roasted, or grilled. Low-sodium or reduced-sodium tomato and vegetable juice. Low-sodium or reduced-sodium tomato sauce and tomato paste. Low-sodium or reduced-sodium canned vegetables. Grains Whole-grain or whole-wheat bread. Whole-grain or whole-wheat pasta. Brown rice. Orpah Cobb. Bulgur. Whole-grain and low-sodium cereals. Pita bread. Low-fat, low-sodium crackers. Whole-wheat flour tortillas. Meats and other proteins Skinless chicken or [place]. Ground chicken or [place]. Pork with fat trimmed off. Fish and seafood. Egg whites. Dried beans, peas, or lentils. Unsalted nuts, nut butters, and seeds. Unsalted canned beans. Lean cuts of beef with fat trimmed off. Low-sodium, lean precooked or cured meat, such as sausages or meat loaves. Dairy Low-fat (1%) or fat-free (skim) milk. Reduced-fat, low-fat, or fat-free cheeses. Nonfat, low-sodium ricotta or cottage cheese. Low-fat or nonfat yogurt. Low-fat, low-sodium cheese. Fats and oils Soft margarine without trans fats. Vegetable oil. Reduced-fat, low-fat, or light mayonnaise and salad dressings (reduced-sodium). Canola, safflower, olive, avocado, soybean, and sunflower oils. Avocado. Seasonings and condiments Herbs. Spices. Seasoning mixes without salt. Other foods Unsalted popcorn and pretzels. Fat-free sweets. The items listed above may not be all the foods and drinks  you can have. Talk to a dietitian to learn more. What foods should I avoid? Fruits Canned fruit in a light or heavy syrup. Fried fruit. Fruit in cream or butter sauce. Vegetables Creamed or fried vegetables. Vegetables in a cheese sauce. Regular canned vegetables that are not marked as low-sodium or reduced-sodium. Regular canned tomato sauce and paste that are not marked as low-sodium or reduced-sodium. Regular tomato and vegetable juices that are not marked as low-sodium or reduced-sodium. Rosita Fire. Olives. Grains Baked goods made with fat, such as croissants, muffins, or some breads. Dry pasta or rice meal packs. Meats and other proteins Fatty cuts of meat. Ribs. Fried meat. Tomasa Blase. Bologna, salami, and other precooked or cured meats, such as sausages or meat loaves, that are not lean and low in sodium. Fat from the back of a pig (fatback). Bratwurst. Salted nuts and seeds. Canned beans with added salt. Canned or smoked fish. Whole eggs or egg yolks. Chicken or [place] with skin. Dairy Whole or 2% milk, cream, and half-and-half. Whole or full-fat cream cheese. Whole-fat or sweetened yogurt. Full-fat cheese. Nondairy creamers. Whipped toppings. Processed cheese and cheese spreads. Fats and oils  Butter. Stick margarine. Lard. Shortening. Ghee. Bacon fat. Tropical oils, such as coconut, palm kernel, or palm oil. Seasonings and condiments Onion salt, garlic salt, seasoned salt, table salt, and sea salt. Worcestershire sauce. Tartar sauce. Barbecue sauce. Teriyaki sauce. Soy sauce, including reduced-sodium soy sauce. Steak sauce. Canned and packaged gravies. Fish sauce. Oyster sauce. Cocktail sauce. Store-bought horseradish. Ketchup. Mustard. Meat flavorings and tenderizers. Bouillon cubes. Hot sauces. Pre-made or packaged marinades. Pre-made or packaged taco seasonings. Relishes. Regular salad dressings. Other foods Salted popcorn and pretzels. The items listed above may not be all the foods and drinks  you should avoid. Talk to a dietitian to learn more. Where to find more information National Heart, Lung, and Blood Institute (NHLBI): BuffaloDryCleaner.gl American Heart Association (AHA): heart.org Academy of Nutrition and Dietetics: eatright.org National Kidney Foundation (NKF): kidney.org This information is not intended to replace advice given to you by your health care provider. Make sure you discuss any questions you have with your health care provider. Document Revised: 08/20/2022 Document Reviewed: 08/20/2022 Elsevier Patient Education  2024 [employer].

## 2023-04-16 ENCOUNTER — Telehealth: Payer: Self-pay

## 2023-04-16 DIAGNOSIS — Z0000 Encounter for general adult medical examination without abnormal findings: Secondary | ICD-10-CM

## 2023-04-16 NOTE — Telephone Encounter (Signed)
Called patient to conduct [employer] welcome call, offer health coaching to improve eating habits, and follow up on connecting with social worker for community resources to work on alcohol cessation. Patient did not answer. Left message for patient to return call.   Renaee Munda, MS, ERHD, [facility]  Care Guide, Health & Wellness Coach [address] [place] [address] Telephone: [phone] Email: [email or web address]

## 2023-04-20 ENCOUNTER — Telehealth: Payer: Self-pay | Admitting: Licensed Clinical Social Worker

## 2023-04-20 NOTE — Telephone Encounter (Signed)
H&V Care Navigation CSW Progress Note  Clinical Social Worker contacted patient by phone to f/u on community resources for ETOH cessation. No answer this morning at [phone], I left 2nd voicemail requesting return call as able. Will re-attempt another time if no call back.    Patient is participating in a Managed Medicaid Plan:  No, [employer] commercial only    SDOH Screenings   Food Insecurity: No Food Insecurity (04/15/2023)  Housing: Low Risk  (04/15/2023)  Transportation Needs: No Transportation Needs (04/15/2023)  Utilities: Not At Risk (04/15/2023)  Alcohol Screen: Low Risk  (04/15/2023)  Physical Activity: Inactive (04/15/2023)  Social Connections: Unknown (12/30/2021)   Received from [facility]  Tobacco Use: Medium Risk (04/15/2023)    Octavio Graves, MSW, LCSW Clinical Social Worker II [facility] Health Heart/Vascular Care Navigation  [phone]- work cell phone (preferred) [phone]- desk phone

## 2023-04-21 ENCOUNTER — Encounter (HOSPITAL_BASED_OUTPATIENT_CLINIC_OR_DEPARTMENT_OTHER): Payer: Self-pay

## 2023-04-21 LAB — ALDOSTERONE + RENIN ACTIVITY W/ RATIO
Aldos/Renin Ratio: 15.8 (ref 0.0–30.0)
Aldosterone: 10.5 ng/dL (ref 0.0–30.0)
Renin Activity, Plasma: 0.664 ng/mL/h (ref 0.167–5.380)

## 2023-04-21 LAB — CORTISOL: Cortisol: 8.4 ug/dL (ref 6.2–19.4)

## 2023-04-22 ENCOUNTER — Telehealth: Payer: Self-pay

## 2023-04-22 ENCOUNTER — Telehealth (HOSPITAL_BASED_OUTPATIENT_CLINIC_OR_DEPARTMENT_OTHER): Payer: Self-pay | Admitting: Licensed Clinical Social Worker

## 2023-04-22 DIAGNOSIS — Z0000 Encounter for general adult medical examination without abnormal findings: Secondary | ICD-10-CM

## 2023-04-22 NOTE — Telephone Encounter (Signed)
Called patient in attempt to conduct [employer] welcome call and offer health coaching per her survey response regarding healthy eating and physical activity. Patient did not answer. Left message for patient to return call.    Renaee Munda, MS, ERHD, [facility]  Care Guide, Health & Wellness Coach [address] [place] [address] Telephone: [phone] Email: [email or web address]

## 2023-04-22 NOTE — Telephone Encounter (Signed)
H&V Care Navigation CSW Progress Note  Clinical Social Worker contacted patient by phone to f/u on community resources for ETOH cessation. No answer this morning at [phone], I left 3rd voicemail requesting return call as able. Remain available should pt wish to engage with CSW for resources.   Patient is participating in a Managed Medicaid Plan:  No, [employer] commercial onl  SDOH Screenings   Food Insecurity: No Food Insecurity (04/15/2023)  Housing: Low Risk  (04/15/2023)  Transportation Needs: No Transportation Needs (04/15/2023)  Utilities: Not At Risk (04/15/2023)  Alcohol Screen: Low Risk  (04/15/2023)  Physical Activity: Inactive (04/15/2023)  Social Connections: Unknown (12/30/2021)   Received from [facility]  Tobacco Use: Medium Risk (04/15/2023)   Octavio Graves, MSW, LCSW Clinical Social Worker II [facility] Health Heart/Vascular Care Navigation  [phone]- work cell phone (preferred) [phone]- desk phone

## 2023-04-28 ENCOUNTER — Ambulatory Visit: Payer: Managed Care, Other (non HMO) | Admitting: Internal Medicine

## 2023-04-28 ENCOUNTER — Other Ambulatory Visit (HOSPITAL_COMMUNITY): Payer: Self-pay

## 2023-05-05 ENCOUNTER — Institutional Professional Consult (permissible substitution) (HOSPITAL_BASED_OUTPATIENT_CLINIC_OR_DEPARTMENT_OTHER): Payer: Managed Care, Other (non HMO) | Admitting: Cardiovascular Disease

## 2023-05-06 ENCOUNTER — Telehealth: Payer: Self-pay

## 2023-05-06 DIAGNOSIS — Z0000 Encounter for general adult medical examination without abnormal findings: Secondary | ICD-10-CM

## 2023-05-06 NOTE — Telephone Encounter (Signed)
Called patient to follow up after previous voicemail regarding participation in [employer] and offering health coaching per survey responses. Patient did not answer. Left message for patient to return call.    Renaee Munda, MS, ERHD, [facility]  Care Guide, Health & Wellness Coach [address] [place] [address] Telephone: [phone] Email: [email or web address]

## 2023-05-10 ENCOUNTER — Ambulatory Visit (HOSPITAL_BASED_OUTPATIENT_CLINIC_OR_DEPARTMENT_OTHER): Payer: Managed Care, Other (non HMO)

## 2023-05-18 ENCOUNTER — Encounter (HOSPITAL_BASED_OUTPATIENT_CLINIC_OR_DEPARTMENT_OTHER): Payer: Self-pay

## 2023-05-19 ENCOUNTER — Encounter (HOSPITAL_BASED_OUTPATIENT_CLINIC_OR_DEPARTMENT_OTHER): Payer: Self-pay

## 2023-05-27 ENCOUNTER — Encounter (HOSPITAL_BASED_OUTPATIENT_CLINIC_OR_DEPARTMENT_OTHER): Payer: Managed Care, Other (non HMO) | Admitting: Family

## 2023-05-27 NOTE — Progress Notes (Deleted)
[facility]pertension [facility] Initial Assessment:    Date:  05/27/2023   ID:  Shari Gibson, DOB [birth year], MRN [id number]  PCP:  Shon Hale, MD  Cardiologist:  None  Nephrologist:  Referring MD: Shon Hale, *   CC: Hypertension  History of Present Illness:    Shari Gibson is a 57 y.o. 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   Prior testing: Echocardiogram 01/2022 normal LVEF 65 to 70%, no R WMA, trivial MR.   CT angio head neck 01/2022 aortic arch unremarkable, right carotid <50% steenosis, L carotid partially retropharyngeal course with no stenosis  CT abdomen pelvis with contrast 07/2022 with normal adrenal glands.  Admitted 01/2022 with TIA vs food allergy. Discharged on ASA/Plavix for 3 weeks and Rosuvastatin.   ED visit 10/23/2022 with palpitations, hypertension with BP 178/88.  She was hypokalemic which was repleted.  PCP has increased valsartan and initiated spironolactone. PCP previously adjusted antihypertensive regimen as followed : Initiated valsartan ? dose increased ? Spironolactone added ? Amlodipine added.   Established with [facility] 04/15/23. Diagnosed with hypertension within the last 12 months. Quit smoking in 2016. Etoh use with 2 beers 4 times per week - had cut back significantly. She was exercising lifting weights, walking on treadmill. Symptomatic with high BP with palpitations and dizziness. She was out of her Amlodipine and it was resumed. Spironolactone 25mg  and Valsartan 320mg  daily were continued. Renin aldosterone, cortisol were unremarkable.   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 ***  Labs via [facility]: 01/2020 4 TSH 0.58 02/11/2023 total cholesterol 199, HDL 94, LDL 75, triglycerides 171 11/23/8117  creatinine 0.61, GFR 105, K 4.5, Na 140  Previous antihypertensives: Valsartan-hydrochlorothiazide - hypokalemia   Past Medical History:  Diagnosis Date   Anemia 01/14/2017   hx of blood transfusion on 02/16/22   Asthma    see 05/11/22 PFT in Epic, follows with Dr. Durel Salts at [facility], LOV 05/11/22 in Epic as of 06/24/22.   Fibroids    GERD (gastroesophageal reflux disease)    Follows with [facility], LOV 04/28/22 w/ Eliott Nine, PA as of 06/24/22.   Granulosa cell tumor of left ovary    Herpes    never had symptoms   History of hiatal hernia 05/19/2022   1 cm hiatal hernia found during EGD   Hyperlipidemia    Hypertension    Follows w/ Dr. Chanetta Marshall at [facility].   TIA (transient ischemic attack) 01/27/2022   Possible TIA vs food allergy. Pt experienced ataxia, slurred speech, perioral numbness and tingling while eating breakfast. CT and MRI of head were negative for a stroke. See 01/27/22 ED visit in Epic. Patient follows w/ [facility], LOV 05/21/22 w/ Ihor Austin, NP (as of 06/24/22.)   Wears glasses     Past Surgical History:  Procedure Laterality Date   BREAST BIOPSY Bilateral    COLONOSCOPY  02/28/2020   [facility]. Dr. Charna Elizabeth. The entire portion of the colon appeared normal.   ROBOTIC ASSISTED LAPAROSCOPIC HYSTERECTOMY AND SALPINGECTOMY Bilateral 07/07/2022   Procedure: XI ROBOTIC ASSISTED LAPAROSCOPIC HYSTERECTOMY AND BILATERAL SALPINGO-OOPHORECTOMY;  Surgeon: Gerald Leitz, MD;  Location: [facility];  Service: Gynecology;  Laterality: Bilateral;   TUBAL LIGATION  1991   UPPER GASTROINTESTINAL ENDOSCOPY  2023    Current Medications: No outpatient medications have been marked as taking for the 05/27/23 encounter (Appointment) with Alver Sorrow, NP.     Allergies:   Patient has no known allergies.   Social History   Socioeconomic History   Marital status: Single    Spouse name:  Not on file   Number of children: 3   Years of education: Not on file   Highest education level: Not on file  Occupational History   Not on file  Tobacco Use   Smoking status: Former    Current packs/day: 0.00    Average packs/day: 1 pack/day for 20.0 years (20.0 ttl pk-yrs)    Types: Cigarettes    Start date: 03/18/1995    Quit date: 03/18/2015    Years since quitting: 8.1    Passive exposure: Never   Smokeless tobacco: Never  Vaping Use   Vaping status: Never Used  Substance and Sexual Activity   Alcohol use: Yes    Comment: about 4 drinks per week   Drug use: No   Sexual activity: Not Currently    Birth control/protection: Surgical    Comment: tubal ligation  Other Topics Concern   Not on file  Social History Narrative   ** Merged History Encounter **       Social Determinants of Health   Financial Resource Strain: Not on file  Food Insecurity: No Food Insecurity (04/15/2023)   Hunger Vital Sign    Worried About Running Out of Food in the Last Year: Never true    Ran Out of Food in the Last Year: Never true  Transportation Needs: No Transportation Needs (04/15/2023)   PRAPARE - Administrator, Civil Service (Medical): No    Lack of Transportation (Non-Medical): No  Physical Activity: Inactive (04/15/2023)   Exercise Vital Sign    Days of Exercise per Week: 0 days    Minutes of Exercise per Session: 0 min  Stress: Not on file  Social Connections: Unknown (12/30/2021)   Received from [facility], [facility]   Social Network    Social Network: Not on file     Family History: The patient's family history includes Alzheimer's disease in her maternal grandmother; Asthma in her daughter; Breast cancer in her mother; Diabetes in her maternal aunt and mother; Hypertension in her maternal aunt and mother. There is no history of Lung disease. She was adopted.  ROS:   Please see the history of present illness.     All other systems reviewed and are  negative.  EKGs/Labs/Other Studies Reviewed:         Recent Labs: 10/23/2022: BUN 20; Creatinine, Ser 0.89; Hemoglobin 11.9; Platelets 326; Potassium 3.4; Sodium 138   Recent Lipid Panel    Component Value Date/Time   CHOL 173 01/27/2022 2146   TRIG 144 01/27/2022 2146   HDL 69 01/27/2022 2146   CHOLHDL 2.5 01/27/2022 2146   VLDL 29 01/27/2022 2146   LDLCALC 75 01/27/2022 2146    Physical Exam:   VS:  There were no vitals taken for this visit. , BMI There is no height or weight on file to calculate BMI. GENERAL:  Well appearing, overweight HEENT: Pupils equal round and reactive, fundi not visualized, oral mucosa unremarkable NECK:  No jugular venous distention, waveform within normal limits, carotid upstroke brisk and symmetric, no bruits, no thyromegaly LYMPHATICS:  No cervical adenopathy LUNGS:  Clear to auscultation bilaterally HEART:  RRR.  PMI not displaced or  sustained,S1 and S2 within normal limits, no S3, no S4, no clicks, no rubs, no murmurs ABD:  Flat, positive bowel sounds normal in frequency in pitch, no bruits, no rebound, no guarding, no midline pulsatile mass, no hepatomegaly, no splenomegaly EXT:  2 plus pulses throughout, no edema, no cyanosis no clubbing SKIN:  No rashes no nodules NEURO:  Cranial nerves II through XII grossly intact, motor grossly intact throughout PSYCH:  Cognitively intact, oriented to person place and time   ASSESSMENT/PLAN:    HTN -BP not at goal less than 130/80.  Has been out of her amlodipine for a couple of weeks.  Refill amlodipine 5 mg daily.  Continue spironolactone 25 mg daily, valsartan 320 mg daily.  Check in via MyChart in 1 week and if BP not at goal plan to increase dose of amlodipine. Renal duplex to rule out stenosis Labs today cortisol, renin-aldosterone. [employer] RPM research study today and BP cuff provided.  Snores-notes her grandson tells her she snores occasionally but wakes feeling well rested and no daytime  somnolence.  As such, will defer sleep study for now.  If BP persistently difficult to control  Etoh use -previously drinking liquor 5 times a week and has cut back to drinking 2 beers 4 times per week.  Plans to continue decreasing.  Plans to continue gradually reducing.  Has previously been given AA resources by PCP.  Screening for Secondary Hypertension:     04/15/2023    1:00 PM  Causes  Renovascular HTN Screened  Thyroid Disease Screened  Pheochromocytoma Screened  Cushing's Syndrome Screened  Coarctation of the Aorta Screened     - Comments BP symmetrical  Compliance Screened    Relevant Labs/Studies:    Latest Ref Rng & Units 10/23/2022   12:05 PM 07/06/2022    8:24 AM 02/15/2022    1:45 AM  Basic Labs  Sodium 135 - 145 mmol/L 138  137  140   Potassium 3.5 - 5.1 mmol/L 3.4  3.6  3.8   Creatinine 0.44 - 1.00 mg/dL 9.14  7.82  9.56        Latest Ref Rng & Units 06/09/2018   10:20 AM 01/14/2017    8:45 AM  Thyroid   TSH 0.450 - 4.500 uIU/mL 0.914  0.87        Latest Ref Rng & Units 04/15/2023    9:21 AM  Renin/Aldosterone   Aldosterone 0.0 - 30.0 ng/dL 21.3   Aldos/Renin Ratio 0.0 - 30.0 15.8              04/15/2023    8:46 AM  Renovascular   Renal Artery [place] Completed Yes      she consents to be monitored in our remote patient monitoring program through [employer].***  she will track his blood pressure twice daily and understands that these trends will help [place] to adjust her medications as needed prior to his next appointment.   Disposition:    FU with MD/PharmD in *** months    Medication Adjustments/Labs and Tests Ordered: Current medicines are reviewed at length with the patient today.  Concerns regarding medicines are outlined above.  No orders of the defined types were placed in this encounter.  No orders of the defined types were placed in this encounter.    Signed, Alver Sorrow, NP  05/27/2023 1:44 PM    [facility]

## 2023-05-28 ENCOUNTER — Encounter (HOSPITAL_BASED_OUTPATIENT_CLINIC_OR_DEPARTMENT_OTHER): Payer: Managed Care, Other (non HMO)

## 2023-06-03 ENCOUNTER — Ambulatory Visit: Payer: Managed Care, Other (non HMO) | Admitting: Internal Medicine

## 2023-06-08 ENCOUNTER — Telehealth (HOSPITAL_BASED_OUTPATIENT_CLINIC_OR_DEPARTMENT_OTHER): Payer: Self-pay | Admitting: Family

## 2023-06-08 DIAGNOSIS — I10 Essential (primary) hypertension: Secondary | ICD-10-CM

## 2023-06-08 MED ORDER — SPIRONOLACTONE 25 MG PO TABS: 25.0000 mg | ORAL_TABLET | Freq: Every day | ORAL | 1 refills | Status: AC

## 2023-06-08 MED ORDER — VALSARTAN 320 MG PO TABS: 320.0000 mg | ORAL_TABLET | Freq: Every day | ORAL | 1 refills | Status: AC

## 2023-06-08 NOTE — Telephone Encounter (Signed)
Received note from health coach that Miss Javorsky has run out of Spironolactone and Valsartan as were Rx'd by previous provider.   Rx for both sent to [facility] for her. MyChart message sent to make her aware.   Alver Sorrow, NP

## 2023-06-23 ENCOUNTER — Ambulatory Visit: Payer: Managed Care, Other (non HMO) | Admitting: Internal Medicine

## 2023-07-22 ENCOUNTER — Encounter (HOSPITAL_BASED_OUTPATIENT_CLINIC_OR_DEPARTMENT_OTHER): Payer: Managed Care, Other (non HMO) | Admitting: Family

## 2023-08-26 ENCOUNTER — Encounter (HOSPITAL_BASED_OUTPATIENT_CLINIC_OR_DEPARTMENT_OTHER): Payer: Self-pay | Admitting: Family

## 2023-10-10 ENCOUNTER — Encounter (HOSPITAL_BASED_OUTPATIENT_CLINIC_OR_DEPARTMENT_OTHER): Payer: Self-pay | Admitting: Emergency Medicine

## 2023-10-10 ENCOUNTER — Emergency Department (HOSPITAL_BASED_OUTPATIENT_CLINIC_OR_DEPARTMENT_OTHER)
Admission: EM | Admit: 2023-10-10 | Discharge: 2023-10-10 | Disposition: A | Discharge status: Home / Self Care | Payer: Managed Care, Other (non HMO) | Attending: Emergency Medicine | Admitting: Emergency Medicine

## 2023-10-10 DIAGNOSIS — Z79899 Other long term (current) drug therapy: Secondary | ICD-10-CM | POA: Insufficient documentation

## 2023-10-10 DIAGNOSIS — R030 Elevated blood-pressure reading, without diagnosis of hypertension: Secondary | ICD-10-CM

## 2023-10-10 DIAGNOSIS — I10 Essential (primary) hypertension: Secondary | ICD-10-CM | POA: Insufficient documentation

## 2023-10-10 DIAGNOSIS — R002 Palpitations: Secondary | ICD-10-CM | POA: Insufficient documentation

## 2023-10-10 LAB — CBC
HCT: 38.4 % (ref 36.0–46.0)
Hemoglobin: 13.2 g/dL (ref 12.0–15.0)
MCH: 31.7 pg (ref 26.0–34.0)
MCHC: 34.4 g/dL (ref 30.0–36.0)
MCV: 92.1 fL (ref 80.0–100.0)
Platelets: 294 10*3/uL (ref 150–400)
RBC: 4.17 MIL/uL (ref 3.87–5.11)
RDW: 11.9 % (ref 11.5–15.5)
WBC: 9.5 10*3/uL (ref 4.0–10.5)
nRBC: 0 % (ref 0.0–0.2)

## 2023-10-10 LAB — BASIC METABOLIC PANEL
Anion gap: 10 (ref 5–15)
BUN: 16 mg/dL (ref 6–20)
CO2: 23 mmol/L (ref 22–32)
Calcium: 9.3 mg/dL (ref 8.9–10.3)
Chloride: 105 mmol/L (ref 98–111)
Creatinine, Ser: 0.7 mg/dL (ref 0.44–1.00)
GFR, Estimated: >60 mL/min (ref >60)
Glucose, Bld: 112 mg/dL — ABNORMAL HIGH (ref 70–99)
Potassium: 3.8 mmol/L (ref 3.5–5.1)
Sodium: 138 mmol/L (ref 135–145)

## 2023-10-10 LAB — TSH: TSH: 0.337 u[IU]/mL — ABNORMAL LOW (ref 0.350–4.500)

## 2023-10-10 LAB — T4, FREE: Free T4: 0.77 ng/dL (ref 0.61–1.12)

## 2023-10-10 NOTE — ED Triage Notes (Signed)
 Pt reports palpitations x 4d, worse today; BP was elevated this am 140s/100s despite compliance with meds; feels SHOB today; NAD noted in triage, denies pain

## 2023-10-10 NOTE — ED Provider Notes (Signed)
 [place] [facility] Provider Note   CSN: [id number] Arrival date & time: 10/10/23  1132     History  Chief Complaint  Patient presents with   Palpitations    Shari Gibson is a 58 y.o. female.  Pt with c/o palpitations in past few days. Symptoms occur randomly, at rest. Lasts minutes to hours. No specific exacerbating or alleviating factors. No chest pain or sob. No syncope. No hx dysrhythmia. No heat intol, sweats, wt loss, or hx thyroid disease. No recent change in meds, compliant w meds. No fever or chills.   The history is provided by the patient and medical records.  Palpitations Associated symptoms: no back pain, no chest pain, no cough, no numbness, no shortness of breath, no vomiting and no weakness        Home Medications Prior to Admission medications   Medication Sig Start Date End Date Taking? Authorizing Provider  albuterol (PROVENTIL HFA;VENTOLIN HFA) 108 (90 Base) MCG/ACT inhaler Inhale 2 puffs into the lungs every 6 (six) hours as needed for wheezing or shortness of breath. 11/18/18   Henson, Vickie L, NP-C  amLODipine (NORVASC) 5 MG tablet Take 1 tablet (5 mg total) by mouth daily. 04/15/23   Alver Sorrow, NP  esomeprazole (NEXIUM) 20 MG capsule Take 20 mg by mouth daily.    [provider]  spironolactone (ALDACTONE) 25 MG tablet Take 1 tablet (25 mg total) by mouth daily. 06/08/23   Alver Sorrow, NP  traZODone (DESYREL) 50 MG tablet Take 50 mg by mouth at bedtime as needed.    [provider]  valsartan (DIOVAN) 320 MG tablet Take 1 tablet (320 mg total) by mouth daily. 06/08/23   Alver Sorrow, NP      Allergies    Patient has no known allergies.    Review of Systems   Review of Systems  Constitutional:  Negative for chills and fever.  HENT:  Negative for sore throat.   Respiratory:  Negative for cough and shortness of breath.   Cardiovascular:  Positive for palpitations. Negative  for chest pain and leg swelling.  Gastrointestinal:  Negative for abdominal pain, diarrhea and vomiting.  Genitourinary:  Negative for flank pain.  Musculoskeletal:  Negative for back pain and neck pain.  Skin:  Negative for rash.  Neurological:  Negative for syncope, weakness and numbness.    Physical Exam Updated Vital Signs BP (!) 145/97 (BP Location: Right Arm)   Pulse 81   Temp 98 F (36.7 C) (Oral)   Resp 18   Ht 1.702 m (5\' 7" )   Wt 96.6 kg   LMP 06/24/2022 (Exact Date)   SpO2 100%   BMI 33.36 kg/m  Physical Exam Vitals and nursing note reviewed.  Constitutional:      Appearance: Normal appearance. She is well-developed.  HENT:     Head: Atraumatic.     Nose: Nose normal.     Mouth/Throat:     Mouth: Mucous membranes are moist.  Eyes:     General: No scleral icterus.    Conjunctiva/sclera: Conjunctivae normal.  Neck:     Trachea: No tracheal deviation.     Comments: Trachea midline. Thyroid not grossly enlarged or tender.  Cardiovascular:     Rate and Rhythm: Normal rate and regular rhythm.     Pulses: Normal pulses.     Heart sounds: Normal heart sounds. No murmur heard.    No friction rub. No gallop.  Pulmonary:  Effort: Pulmonary effort is normal. No respiratory distress.     Breath sounds: Normal breath sounds.  Abdominal:     General: There is no distension.     Palpations: Abdomen is soft.     Tenderness: There is no abdominal tenderness.  Musculoskeletal:        General: No swelling or tenderness.     Cervical back: Normal range of motion and neck supple. No rigidity. No muscular tenderness.     Right lower leg: No edema.     Left lower leg: No edema.  Skin:    General: Skin is warm and dry.     Findings: No rash.  Neurological:     Mental Status: She is alert.     Comments: Alert, speech normal. Motor/sens grossly intact. Steady gait.   Psychiatric:        Mood and Affect: Mood normal.     ED Results / Procedures / Treatments    Labs (all labs ordered are listed, but only abnormal results are displayed) Results for orders placed or performed during the hospital encounter of 10/10/23  CBC   Collection Time: 10/10/23 12:20 PM  Result Value Ref Range   WBC 9.5 4.0 - 10.5 K/uL   RBC 4.17 3.87 - 5.11 MIL/uL   Hemoglobin 13.2 12.0 - 15.0 g/dL   HCT 82.9 56.2 - 13.0 %   MCV 92.1 80.0 - 100.0 fL   MCH 31.7 26.0 - 34.0 pg   MCHC 34.4 30.0 - 36.0 g/dL   RDW 86.5 78.4 - 69.6 %   Platelets 294 150 - 400 K/uL   nRBC 0.0 0.0 - 0.2 %  Basic metabolic panel   Collection Time: 10/10/23 12:21 PM  Result Value Ref Range   Sodium 138 135 - 145 mmol/L   Potassium 3.8 3.5 - 5.1 mmol/L   Chloride 105 98 - 111 mmol/L   CO2 23 22 - 32 mmol/L   Glucose, Bld 112 (H) 70 - 99 mg/dL   BUN 16 6 - 20 mg/dL   Creatinine, Ser 2.95 0.44 - 1.00 mg/dL   Calcium 9.3 8.9 - 28.4 mg/dL   GFR, Estimated >13 >24 mL/min   Anion gap 10 5 - 15     EKG EKG Interpretation Date/Time:  Sunday October 10 2023 11:40:36 EST Ventricular Rate:  103 PR Interval:  160 QRS Duration:  85 QT Interval:  327 QTC Calculation: 428 R Axis:   12  Text Interpretation: Sinus tachycardia No significant change since last tracing Confirmed by Cathren Laine ([id number]) on 10/10/2023 11:43:46 AM  Radiology No results found.  Procedures Procedures    Medications Ordered in ED Medications - No data to display  ED Course/ Medical Decision Making/ A&P                                 Medical Decision Making Problems Addressed: Elevated blood pressure reading: acute illness or injury Essential hypertension: chronic illness or injury with exacerbation, progression, or side effects of treatment that poses a threat to life or bodily functions Palpitations: acute illness or injury with systemic symptoms that poses a threat to life or bodily functions  Amount and/or Complexity of Data Reviewed External Data Reviewed: notes. Labs: ordered. Decision-making  details documented in ED Course. ECG/medicine tests: ordered and independent interpretation performed. Decision-making details documented in ED Course.  Risk Decision regarding hospitalization.   Iv ns. Continuous pulse ox and  cardiac monitoring. Labs ordered/sent.   Differential diagnosis includes dysrhythmia, svt, afib, pvc, palpitations, etc . Dispo decision including potential need for admission considered - will get labs and reassess.   Reviewed nursing notes and prior charts for additional history. External reports reviewed.   Cardiac monitor: sinus rhythm, rate 90.  Labs reviewed/interpreted by me - chem normal. Wbc and hgb normal.   Recheck, hr in 70s. No cp or sob. Remains in nsr, no dysrhythmia.   Pt currently appears stable for d/c.   Rec close pcp/cardiology f/u.  Return precautions provided.          Final Clinical Impression(s) / ED Diagnoses Final diagnoses:  Palpitations  Elevated blood pressure reading  Essential hypertension    Rx / DC Orders ED Discharge Orders          Ordered    Ambulatory referral to Cardiology       Comments: If you have not heard from the Cardiology office within the next 72 hours please call [phone].   10/10/23 1516              Cathren Laine, MD 10/10/23 [phone]

## 2023-10-10 NOTE — ED Notes (Signed)
 Palpitations started 5 days ago, getting worse every day. Been getting worse after eating or drinking. No GI upset, occasionally dizzy but not always associated with palps. Hx of TIA June 2024 with dysarrthria and L side facial tingling, resolved after 2 hours. MRI and CT during same.

## 2023-10-10 NOTE — Discharge Instructions (Addendum)
 It was our pleasure to provide your ER care today - we hope that you feel better.  Drink plenty of fluids/stay well hydrated. Minimize caffeine use.   Continue your blood pressure medication.  Follow up closely with your primary care doctor in the coming week.  For palpitations, also follow up closely with cardiology.   Return to ER if worse, new symptoms, persistent fast heart beating, fainting, chest pain, trouble breathing, or other concern.

## 2023-10-12 ENCOUNTER — Telehealth (HOSPITAL_BASED_OUTPATIENT_CLINIC_OR_DEPARTMENT_OTHER): Payer: Self-pay | Admitting: Family

## 2023-10-12 NOTE — Telephone Encounter (Signed)
  Per MyChart scheduling message:  Initial complaint: Been having irregular heartbeats for last 4 days   Patient c/o Palpitations:  STAT if patient reporting lightheadedness, shortness of breath, or chest pain  How long have you had palpitations/irregular HR/ Afib? Are you having the symptoms now?   Are you currently experiencing lightheadedness, SOB or CP?   Do you have a history of afib (atrial fibrillation) or irregular heart rhythm?   Have you checked your BP or HR? (document readings if available):   Are you experiencing any other symptoms?     Been having for close to a week now.  Blood pressure has a little high from time to time. Experiencing no other issues.  No history of afib

## 2023-10-19 ENCOUNTER — Encounter: Payer: Self-pay | Admitting: Internal Medicine

## 2023-10-19 ENCOUNTER — Ambulatory Visit: Payer: Managed Care, Other (non HMO) | Admitting: Internal Medicine

## 2023-10-19 VITALS — BP 151/81 | HR 72 | Ht 67.0 in | Wt 215.0 lb

## 2023-10-19 DIAGNOSIS — F17200 Nicotine dependence, unspecified, uncomplicated: Secondary | ICD-10-CM | POA: Insufficient documentation

## 2023-10-19 DIAGNOSIS — Z87891 Personal history of nicotine dependence: Secondary | ICD-10-CM

## 2023-10-19 DIAGNOSIS — J4530 Mild persistent asthma, uncomplicated: Secondary | ICD-10-CM | POA: Diagnosis not present

## 2023-10-19 DIAGNOSIS — K219 Gastro-esophageal reflux disease without esophagitis: Secondary | ICD-10-CM

## 2023-10-19 NOTE — Patient Instructions (Addendum)
 It was a pleasure to see you today!  Please schedule follow up scheduled with myself in 6 months.  If my schedule is not open yet, we will contact you with a reminder closer to that time. Please call [phone] if you haven't heard from [place] a month before, and always call us sooner if issues or concerns arise. You can also send [place] a message through MyChart, but but aware that this is not to be used for urgent issues and it may take up to 5-7 days to receive a reply. Please be aware that you will likely be able to view your results before I have a chance to respond to them. Please give [place] 5 business days to respond to any non-urgent results.   Continue the acid reflux medication. Reflux can sometimes make asthma symptoms worse.   Since your rescue inhaler use as increased we will start a maintenance inhaler for your asthma. You wanted to check with your insurance company to see what is covered and you will let me know. Ask them what steroid inhaler is covered for asthma. Here are some options:  Fluticasone Mometasone Budesonide   Take the albuterol rescue inhaler every 4 to 6 hours as needed for wheezing or shortness of breath. You can also take it 15 minutes before exercise or exertional activity. Side effects include heart racing or pounding, jitters or anxiety. If you have a history of an irregular heart rhythm, it can make this worse. Can also give some patients a hard time sleeping.  What is GERD? Gastroesophageal reflux disease (GERD) is gastroesophageal reflux diseasewhich occurs when the lower esophageal sphincter (LES) opens spontaneously, for varying periods of time, or does not close properly and stomach contents rise up into the esophagus. GER is also called acid reflux or acid regurgitation, because digestive juices--called acids--rise up with the food. The esophagus is the tube that carries food from the mouth to the stomach. The LES is a ring of muscle at the bottom of the  esophagus that acts like a valve between the esophagus and stomach.  When acid reflux occurs, food or fluid can be tasted in the back of the mouth. When refluxed stomach acid touches the lining of the esophagus it may cause a burning sensation in the chest or throat called heartburn or acid indigestion. Occasional reflux is common. Persistent reflux that occurs more than twice a week is considered GERD, and it can eventually lead to more serious health problems. People of all ages can have GERD. Studies have shown that GERD may worsen or contribute to asthma, chronic cough, and pulmonary fibrosis.   What are the symptoms of GERD? The main symptom of GERD in adults is frequent heartburn, also called acid indigestion--burning-type pain in the lower part of the mid-chest, behind the breast bone, and in the mid-abdomen.  Not all reflux is acidic in nature, and many patients don't have heart burn at all. Sometimes it feels like a cough (either dry or with mucus), choking sensation, asthma, shortness of breath, waking up at night, frequent throat clearing, or trouble swallowing.    What causes GERD? The reason some people develop GERD is still unclear. However, research shows that in people with GERD, the LES relaxes while the rest of the esophagus is working. Anatomical abnormalities such as a hiatal hernia may also contribute to GERD. A hiatal hernia occurs when the upper part of the stomach and the LES move above the diaphragm, the muscle wall that separates  the stomach from the chest. Normally, the diaphragm helps the LES keep acid from rising up into the esophagus. When a hiatal hernia is present, acid reflux can occur more easily. A hiatal hernia can occur in people of any age and is most often a normal finding in otherwise healthy people over age 39. Most of the time, a hiatal hernia produces no symptoms.   Other factors that may contribute to GERD include - Obesity or recent weight gain - Pregnancy   - Smoking  - Diet - Certain medications  Common foods that can worsen reflux symptoms include: - carbonated beverages - artificial sweeteners - citrus fruits  - chocolate  - drinks with caffeine or alcohol  - fatty and fried foods  - garlic and onions  - mint flavorings  - spicy foods  - tomato-based foods, like spaghetti sauce, salsa, chili, and pizza   Lifestyle Changes If you smoke, stop.  Avoid foods and beverages that worsen symptoms (see above.) Lose weight if needed.  Eat small, frequent meals.  Wear loose-fitting clothes.  Avoid lying down for 3 hours after a meal.  Raise the head of your bed 6 to 8 inches by securing wood blocks under the bedposts. Just using extra pillows will not help, but using a wedge-shaped pillow may be helpful.  Medications  H2 blockers, such as cimetidine (Tagamet HB), famotidine (Pepcid AC), nizatidine (Axid AR), and ranitidine (Zantac 75), decrease acid production. They are available in prescription strength and over-the-counter strength. These drugs provide short-term relief and are effective for about half of those who have GERD symptoms.  Proton pump inhibitors include omeprazole (Prilosec, Zegerid), lansoprazole (Prevacid), pantoprazole (Protonix), rabeprazole (Aciphex), and esomeprazole (Nexium), which are available by prescription. Prilosec is also available in over-the-counter strength. Proton pump inhibitors are more effective than H2 blockers and can relieve symptoms and heal the esophageal lining in almost everyone who has GERD.  Because drugs work in different ways, combinations of medications may help control symptoms. People who get heartburn after eating may take both antacids and H2 blockers. The antacids work first to neutralize the acid in the stomach, and then the H2 blockers act on acid production. By the time the antacid stops working, the H2 blocker will have stopped acid production. Your health care provider is the best source  of information about how to use medications for GERD.   Points to Remember 1. You can have GERD without having heartburn. Your symptoms could include a dry cough, asthma symptoms, or trouble swallowing.  2. Taking medications daily as prescribed is important in controlling you symptoms.  Sometimes it can take up to 8 weeks to fully achieve the effects of the medications prescribed.  3. Coughing related to GERD can be difficult to treat and is very frustrating!  However, it is important to stick with these medications and lifestyle modifications before pursuing more aggressive or invasive test and treatments.

## 2023-10-19 NOTE — Progress Notes (Signed)
 CHARNIECE VENTURINO    [id number]    1966/08/11  Primary Care Physician:Timberlake, Meridee Score, MD Date of Appointment: 10/19/2023 Established Patient Visit  Chief complaint:   Chief Complaint  Patient presents with   Asthma     HPI: Shari Gibson  is a 58 y.o. woman with mild intermittent asthma.  Interval Updates: Here for asthma follow up.  Usually needs with exertion and sometimes if her reflux is flaring. Thinks sometimes indigestion leads to wheezing.   Previously triggered with chlorine when she was a child. And she is thinking about taking swimming up again.   Currently on albuterol inhaler three times/week, some weeks more. Use has increased since previous visit.   Denies allergy symptoms or seasonal variation.   I have reviewed the patient's family social and past medical history and updated as appropriate.   Past Medical History:  Diagnosis Date   Anemia 01/14/2017   hx of blood transfusion on 02/16/22   Asthma    see 05/11/22 PFT in Epic, follows with Dr. Durel Salts at [facility], LOV 05/11/22 in Epic as of 06/24/22.   Fibroids    GERD (gastroesophageal reflux disease)    Follows with [facility], LOV 04/28/22 w/ Eliott Nine, PA as of 06/24/22.   Granulosa cell tumor of left ovary    Herpes    never had symptoms   History of hiatal hernia 05/19/2022   1 cm hiatal hernia found during EGD   Hyperlipidemia    Hypertension    Follows w/ Dr. Chanetta Marshall at [facility].   TIA (transient ischemic attack) 01/27/2022   Possible TIA vs food allergy. Pt experienced ataxia, slurred speech, perioral numbness and tingling while eating breakfast. CT and MRI of head were negative for a stroke. See 01/27/22 ED visit in Epic. Patient follows w/ [facility], LOV 05/21/22 w/ Ihor Austin, NP (as of 06/24/22.)   Wears glasses     Past Surgical History:  Procedure Laterality Date   BREAST BIOPSY Bilateral    COLONOSCOPY   02/28/2020   [facility]. Dr. Charna Elizabeth. The entire portion of the colon appeared normal.   ROBOTIC ASSISTED LAPAROSCOPIC HYSTERECTOMY AND SALPINGECTOMY Bilateral 07/07/2022   Procedure: XI ROBOTIC ASSISTED LAPAROSCOPIC HYSTERECTOMY AND BILATERAL SALPINGO-OOPHORECTOMY;  Surgeon: Gerald Leitz, MD;  Location: [facility];  Service: Gynecology;  Laterality: Bilateral;   TUBAL LIGATION  1991   UPPER GASTROINTESTINAL ENDOSCOPY  2023    Family History  Adopted: Yes  Problem Relation Age of Onset   Breast cancer Mother        in 74's   Diabetes Mother    Hypertension Mother    Asthma Daughter    Diabetes Maternal Aunt    Hypertension Maternal Aunt    Alzheimer's disease Maternal Grandmother    Lung disease Neg Hx     Social History   Occupational History   Not on file  Tobacco Use   Smoking status: Former    Current packs/day: 0.00    Average packs/day: 1 pack/day for 20.0 years (20.0 ttl pk-yrs)    Types: Cigarettes    Start date: 03/18/1995    Quit date: 03/18/2015    Years since quitting: 8.5    Passive exposure: Never   Smokeless tobacco: Never  Vaping Use   Vaping status: Never Used  Substance and Sexual Activity   Alcohol use: Yes    Comment: about 4 drinks per week  Drug use: No   Sexual activity: Not Currently    Birth control/protection: Surgical    Comment: tubal ligation     Physical Exam: Blood pressure (!) 151/81, pulse 72, height 5\' 7"  (1.702 m), weight 215 lb (97.5 kg), last menstrual period 06/24/2022, SpO2 100%.  Gen:      No distress Lungs:  ctab no wheeze CV:         RRR no mrg   Data Reviewed: Imaging: I have personally reviewed the   PFTs:     Latest Ref Rng & Units 05/11/2022    9:47 AM  PFT Results  FVC-Pre L 2.84   FVC-Predicted Pre % 74   FVC-Post L 2.74   FVC-Predicted Post % 71   Pre FEV1/FVC % % 74   Post FEV1/FCV % % 78   FEV1-Pre L 2.10   FEV1-Predicted Pre % 70   FEV1-Post L 2.15   DLCO  uncorrected ml/min/mmHg 18.35   DLCO UNC% % 80   DLCO corrected ml/min/mmHg 19.83   DLCO COR %Predicted % 87   DLVA Predicted % 104   TLC L 5.73   TLC % Predicted % 104   RV % Predicted % 146    I have personally reviewed the patient's PFTs 04/2022 and they show normal pulmonary function  Labs:  Immunization status: Immunization History  Administered Date(s) Administered   Tdap 01/14/2017    External Records Personally Reviewed: GI records  Assessment:  Mild persistent asthma, worsening control from previous GERD  Plan/Recommendations: Continue the acid reflux medication. Reflux can sometimes make asthma symptoms worse.   Since your rescue inhaler use as increased we will start a maintenance inhaler for your asthma. You wanted to check with your insurance company to see what is covered and you will let me know. Ask them what steroid inhaler is covered for asthma. Here are some options:  Fluticasone Mometasone Budesonide   Take the albuterol rescue inhaler every 4 to 6 hours as needed for wheezing or shortness of breath.   Return to Care: Return in about 6 months (around 04/20/2024).   Durel Salts, MD Pulmonary and Critical Care Medicine [facility] Office:[phone]

## 2023-11-09 ENCOUNTER — Encounter (HOSPITAL_BASED_OUTPATIENT_CLINIC_OR_DEPARTMENT_OTHER): Payer: Self-pay

## 2024-01-20 ENCOUNTER — Ambulatory Visit (HOSPITAL_BASED_OUTPATIENT_CLINIC_OR_DEPARTMENT_OTHER): Admitting: Family

## 2024-01-20 ENCOUNTER — Other Ambulatory Visit (HOSPITAL_BASED_OUTPATIENT_CLINIC_OR_DEPARTMENT_OTHER)

## 2024-01-20 ENCOUNTER — Encounter (HOSPITAL_BASED_OUTPATIENT_CLINIC_OR_DEPARTMENT_OTHER): Payer: Self-pay | Admitting: Family

## 2024-01-20 VITALS — BP 160/80 | HR 74 | Ht 67.0 in | Wt 221.7 lb

## 2024-01-20 DIAGNOSIS — R002 Palpitations: Secondary | ICD-10-CM

## 2024-01-20 DIAGNOSIS — I10 Essential (primary) hypertension: Secondary | ICD-10-CM | POA: Diagnosis not present

## 2024-01-20 MED ORDER — NEBIVOLOL HCL 10 MG PO TABS
10.0000 mg | ORAL_TABLET | Freq: Every day | ORAL | 1 refills | Status: AC
Start: 2024-01-20 — End: ?

## 2024-01-20 NOTE — Patient Instructions (Addendum)
 Medication Instructions:  Your physician has recommended you make the following change in your medication:   Start: Bystolic 10mg  daily   Testing/Procedures: Your physician has requested that you have a renal artery duplex. During this test, an ultrasound is used to evaluate blood flow to the kidneys. Allow one hour for this exam. Do not eat after midnight the day before and avoid carbonated beverages. Take your medications as you usually do.  Your physician has requested that you have a carotid duplex. This test is an ultrasound of the carotid arteries in your neck. It looks at blood flow through these arteries that supply the brain with blood. Allow one hour for this exam. There are no restrictions or special instructions.  Your physician has recommended that you wear a Zio monitor.   This monitor is a medical device that records the heart's electrical activity. Doctors most often use these monitors to diagnose arrhythmias. Arrhythmias are problems with the speed or rhythm of the heartbeat. The monitor is a small device applied to your chest. You can wear one while you do your normal daily activities. While wearing this monitor if you have any symptoms to push the button and record what you felt. Once you have worn this monitor for the period of time provider prescribed (Usually 14 days), you will return the monitor device in the postage paid box. Once it is returned they will download the data collected and provide us  with a report which the provider will then review and we will call you with those results. Important tips:  Avoid showering during the first 24 hours of wearing the monitor. Avoid excessive sweating to help maximize wear time. Do not submerge the device, no hot tubs, and no swimming pools. Keep any lotions or oils away from the patch. After 24 hours you may shower with the patch on. Take brief showers with your back facing the shower head.  Do not remove patch once it has been  placed because that will interrupt data and decrease adhesive wear time. Push the button when you have any symptoms and write down what you were feeling. Once you have completed wearing your monitor, remove and place into box which has postage paid and place in your outgoing mailbox.  If for some reason you have misplaced your box then call our office and we can provide another box and/or mail it off for you.   Follow-Up: Please follow up in 2-3 months in [facility] with Dr. Theodis Fiscal, Neomi Banks, NP or Donivan Furry PharmD    Special Instructions:  To prevent palpitations: Make sure you are adequately hydrated.  Avoid and/or limit caffeine containing beverages like soda or tea. Exercise regularly.  Manage stress well. Some over the counter medications can cause palpitations such as Benadryl, AdvilPM, TylenolPM. Regular Advil  or Tylenol  do not cause palpitations.

## 2024-01-20 NOTE — Progress Notes (Signed)
 Advanced Hypertension Clinic Assessment:    Date:  01/20/2024   ID:  Shari Gibson, DOB [birth year], MRN [id number]  PCP:  Ransom Byers, MD  Cardiologist:  None  Nephrologist:  Referring MD: Ransom Byers, *   CC: Hypertension  History of Present Illness:    LEATHER ESTIS is a 58 y.o. 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   Prior testing: Echocardiogram 01/2022 normal LVEF 65 to 70%, no R WMA, trivial MR.   CT angio head neck 01/2022 aortic arch unremarkable, right carotid <50% steenosis, L carotid partially retropharyngeal course with no stenosis  CT abdomen pelvis with contrast 07/2022 with normal adrenal glands.  Admitted 01/2022 with TIA vs food allergy. Discharged on ASA/Plavix  for 3 weeks and Rosuvastatin .   Established with [facility] 04/15/23 having been diagnosed with HTN within past 1 year and difficult to control. Quit tobacco 2016. Alcohol use with 2 beers 4 times per week.  Previously drinking liquor 5 times per week and had cut back significantly.  For exercise she had a gym membership. She reported following low sodium diet. No symptoms of sleep apnea.   PCP previously adjusted antihypertensive regimen as followed : Initiated valsartan  ? dose increased ? Spironolactone  added ? Amlodipine  added.   At initial visit she was out of amlodipine , it was refilled. Continue spironolactone  25mg  daily, valsartan  320mg  daily. Renal duplex ordered but she cancelled multiple times.   Contacted 09/2023 noting episodes of palpitations.   Presents today for overdue follow up. When describing previous palpitations, reports she felt her heart fluttering "real quick" occurring daily. The day of her ED visit 10/10/23 her palpitations increased in severity. Workup was with subclinical hypothyroidism (T4 0.77,  TSH 0.337) and CBC/BMP unremarkable. Since that time her palpitations are still present but not as bothersome. She feels she has maybe gotten used to them and that is why she notices them less. Most recently, notes they may increasing. Recently cut back on alcohol which did help. Does try to hydrate well. No recent stressors. She limits caffeine (was drinking more soda at time of ED visit). No chest pain, exertional dyspnea, edema. Not checking BP routinely.   Previous antihypertensives: Valsartan -hydrochlorothiazide - hypokalemia   Past Medical History:  Diagnosis Date   Anemia 01/14/2017   hx of blood transfusion on 02/16/22   Asthma    see 05/11/22 PFT in Epic, follows with Dr. Louie Rover at [facility], LOV 05/11/22 in Epic as of 06/24/22.   Fibroids    GERD (gastroesophageal reflux disease)    Follows with [facility], LOV 04/28/22 w/ Joann Mu, PA as of 06/24/22.   Granulosa cell tumor of left ovary    Herpes    never had symptoms   History of hiatal hernia 05/19/2022   1 cm hiatal hernia found during EGD   Hyperlipidemia    Hypertension    Follows w/ Dr. Donia Furlough at [facility].   TIA (transient ischemic attack) 01/27/2022   Possible TIA vs food allergy. Pt experienced ataxia, slurred speech, perioral numbness and tingling while eating breakfast. CT and MRI of head were negative for a stroke. See 01/27/22 ED visit in Epic. Patient follows w/ [facility], LOV 05/21/22 w/ Johny Nap, NP (as of 06/24/22.)   Wears glasses     Past Surgical History:  Procedure Laterality Date  BREAST BIOPSY Bilateral    COLONOSCOPY  02/28/2020   [facility]. Dr. Tami Falcon. The entire portion of the colon appeared normal.   ROBOTIC ASSISTED LAPAROSCOPIC HYSTERECTOMY AND SALPINGECTOMY Bilateral 07/07/2022   Procedure: XI ROBOTIC ASSISTED LAPAROSCOPIC HYSTERECTOMY AND BILATERAL SALPINGO-OOPHORECTOMY;  Surgeon: Arlee Lace, MD;  Location: [facility];  Service: Gynecology;  Laterality: Bilateral;   TUBAL LIGATION  1991   UPPER GASTROINTESTINAL ENDOSCOPY  2023    Current Medications: No outpatient medications have been marked as taking for the 01/20/24 encounter (Office Visit) with Clearnce Curia, NP.     Allergies:   Patient has no known allergies.   Social History   Socioeconomic History   Marital status: Single    Spouse name: Not on file   Number of children: 3   Years of education: Not on file   Highest education level: Not on file  Occupational History   Not on file  Tobacco Use   Smoking status: Former    Current packs/day: 0.00    Average packs/day: 1 pack/day for 20.0 years (20.0 ttl pk-yrs)    Types: Cigarettes    Start date: 03/18/1995    Quit date: 03/18/2015    Years since quitting: 8.8    Passive exposure: Never   Smokeless tobacco: Never  Vaping Use   Vaping status: Never Used  Substance and Sexual Activity   Alcohol use: Yes    Comment: about 4 drinks per week   Drug use: No   Sexual activity: Not Currently    Birth control/protection: Surgical    Comment: tubal ligation  Other Topics Concern   Not on file  Social History Narrative   ** Merged History Encounter **       Social Drivers of Health   Financial Resource Strain: Not on file  Food Insecurity: No Food Insecurity (04/15/2023)   Hunger Vital Sign    Worried About Running Out of Food in the Last Year: Never true    Ran Out of Food in the Last Year: Never true  Transportation Needs: No Transportation Needs (04/15/2023)   PRAPARE - Administrator, Civil Service (Medical): No    Lack of Transportation (Non-Medical): No  Physical Activity: Inactive (04/15/2023)   Exercise Vital Sign    Days of Exercise per Week: 0 days    Minutes of Exercise per Session: 0 min  Stress: Not on file  Social Connections: Unknown (12/30/2021)   Received from [facility]   Social Network    Social Network: Not on  file     Family History: The patient's family history includes Alzheimer's disease in her maternal grandmother; Asthma in her daughter; Breast cancer in her mother; Diabetes in her maternal aunt and mother; Hypertension in her maternal aunt and mother. There is no history of Lung disease. She was adopted.  ROS:   Please see the history of present illness.     All other systems reviewed and are negative.  EKGs/Labs/Other Studies Reviewed:    EKG Interpretation Date/Time:  Thursday January 20 2024 10:48:10 EDT Ventricular Rate:  74 PR Interval:  178 QRS Duration:  68 QT Interval:  378 QTC Calculation: 419 R Axis:   -25  Text Interpretation: Normal sinus rhythm Poor R wave progression Confirmed by Neomi Banks ([id number]) on 01/20/2024 10:54:21 AM    Recent Labs: 10/10/2023: BUN 16; Creatinine, Ser 0.70; Hemoglobin 13.2; Platelets 294; Potassium 3.8; Sodium 138; TSH 0.337   Recent  Lipid Panel    Component Value Date/Time   CHOL 173 01/27/2022 2146   TRIG 144 01/27/2022 2146   HDL 69 01/27/2022 2146   CHOLHDL 2.5 01/27/2022 2146   VLDL 29 01/27/2022 2146   LDLCALC 75 01/27/2022 2146    Physical Exam:   VS:  BP (!) 160/80 (BP Location: Right Arm, Patient Position: Sitting)   Pulse 74   Ht 5\' 7"  (1.702 m)   Wt 221 lb 11.2 oz (100.6 kg)   LMP 06/24/2022 (Exact Date)   SpO2 97%   BMI 34.72 kg/m  , BMI Body mass index is 34.72 kg/m.  Vitals:   01/20/24 1043 01/20/24 1051  BP: (!) 140/82 (!) 160/80  Pulse: 74   Height: 5\' 7"  (1.702 m)   Weight: 221 lb 11.2 oz (100.6 kg)   SpO2: 97%   BMI (Calculated): 34.71     GENERAL:  Well appearing, overweight HEENT: Pupils equal round and reactive, fundi not visualized, oral mucosa unremarkable NECK:  No jugular venous distention, waveform within normal limits, carotid upstroke brisk and symmetric, no bruits, no thyromegaly LYMPHATICS:  No cervical adenopathy LUNGS:  Clear to auscultation bilaterally HEART:  RRR.  PMI not displaced  or sustained,S1 and S2 within normal limits, no S3, no S4, no clicks, no rubs, no murmurs ABD:  Flat, positive bowel sounds normal in frequency in pitch, no bruits, no rebound, no guarding, no midline pulsatile mass, no hepatomegaly, no splenomegaly EXT:  2 plus pulses throughout, no edema, no cyanosis no clubbing SKIN:  No rashes no nodules NEURO:  Cranial nerves II through XII grossly intact, motor grossly intact throughout PSYCH:  Cognitively intact, oriented to person place and time   ASSESSMENT/PLAN:    HTN -BP not at goal less than 130/80.  Continue spironolactone  25 mg daily, valsartan  320 mg daily, amlodipine  5 mg daily.Aaron Aas   Rx Bystolic 10mg  daily. Defer Metoprolol as will not improve BP control substantially and Carvedilol as BID dosing difficult.  Renal duplex to rule out stenosis Carotid duplex due to asymptomatic BP.  Labs 03/2023 cortisol, renin-aldosterone unremarkable Discussed to monitor BP at home at least 2 hours after medications and sitting for 5-10 minutes.   Palpitations - Prior ED workup 09/2023 with subclinical hypothyroidism. Still with intermittent palpitations described as "flutter". Rx Bystolic 10mg  daily. 14 day ZIO placed in clinic.   Snores-notes her grandson tells her she snores occasionally but wakes feeling well rested and no daytime somnolence.  As such, will defer sleep study for now.  If BP persistently difficult to control  Etoh use - Not drinking etoh at this time, congratulated. Previously 2 beers 4 times per week and prior to that liquor 5 times per week.   Screening for Secondary Hypertension:     04/15/2023    1:00 PM  Causes  Renovascular HTN Screened  Thyroid  Disease Screened  Pheochromocytoma Screened  Cushing's Syndrome Screened  Coarctation of the Aorta Screened     - Comments BP symmetrical  Compliance Screened    Relevant Labs/Studies:    Latest Ref Rng & Units 10/10/2023   12:21 PM 10/23/2022   12:05 PM 07/06/2022    8:24 AM  Basic  Labs  Sodium 135 - 145 mmol/L 138  138  137   Potassium 3.5 - 5.1 mmol/L 3.8  3.4  3.6   Creatinine 0.44 - 1.00 mg/dL 1.61  0.96  0.45        Latest Ref Rng & Units 10/10/2023   12:21  PM 06/09/2018   10:20 AM  Thyroid    TSH 0.350 - 4.500 uIU/mL 0.337  0.914        Latest Ref Rng & Units 04/15/2023    9:21 AM  Renin/Aldosterone   Aldosterone 0.0 - 30.0 ng/dL 40.9   Aldos/Renin Ratio 0.0 - 30.0 15.8              04/15/2023    8:46 AM  Renovascular   Renal Artery US  Completed Yes      Disposition:    FU with MD/PharmD in 2-3 months    Medication Adjustments/Labs and Tests Ordered: Current medicines are reviewed at length with the patient today.  Concerns regarding medicines are outlined above.  Orders Placed This Encounter  Procedures   EKG 12-Lead   No orders of the defined types were placed in this encounter.    Signed, Clearnce Curia, NP  01/20/2024 10:54 AM    [facility]

## 2024-02-16 ENCOUNTER — Ambulatory Visit (HOSPITAL_BASED_OUTPATIENT_CLINIC_OR_DEPARTMENT_OTHER): Payer: Self-pay | Admitting: Family

## 2024-02-16 DIAGNOSIS — R002 Palpitations: Secondary | ICD-10-CM

## 2024-02-23 ENCOUNTER — Encounter (HOSPITAL_BASED_OUTPATIENT_CLINIC_OR_DEPARTMENT_OTHER)

## 2024-03-19 ENCOUNTER — Other Ambulatory Visit (HOSPITAL_BASED_OUTPATIENT_CLINIC_OR_DEPARTMENT_OTHER): Payer: Self-pay | Admitting: Family

## 2024-03-19 DIAGNOSIS — I10 Essential (primary) hypertension: Secondary | ICD-10-CM

## 2024-03-30 ENCOUNTER — Other Ambulatory Visit: Payer: Self-pay | Admitting: Gynecologic Oncology

## 2024-03-30 DIAGNOSIS — D3912 Neoplasm of uncertain behavior of left ovary: Secondary | ICD-10-CM

## 2024-03-31 ENCOUNTER — Inpatient Hospital Stay: Attending: Gynecologic Oncology | Admitting: Gynecologic Oncology

## 2024-03-31 ENCOUNTER — Inpatient Hospital Stay: Admitting: Gynecologic Oncology

## 2024-03-31 ENCOUNTER — Encounter: Payer: Self-pay | Admitting: Gynecologic Oncology

## 2024-03-31 VITALS — BP 137/68 | HR 61 | Temp 97.6°F | Resp 20 | Wt 217.0 lb

## 2024-03-31 DIAGNOSIS — Z8603 Personal history of neoplasm of uncertain behavior: Secondary | ICD-10-CM | POA: Insufficient documentation

## 2024-03-31 DIAGNOSIS — Z90722 Acquired absence of ovaries, bilateral: Secondary | ICD-10-CM | POA: Diagnosis not present

## 2024-03-31 DIAGNOSIS — D3912 Neoplasm of uncertain behavior of left ovary: Secondary | ICD-10-CM

## 2024-03-31 DIAGNOSIS — Z9079 Acquired absence of other genital organ(s): Secondary | ICD-10-CM | POA: Insufficient documentation

## 2024-03-31 DIAGNOSIS — Z90710 Acquired absence of both cervix and uterus: Secondary | ICD-10-CM | POA: Insufficient documentation

## 2024-03-31 NOTE — Patient Instructions (Signed)
 It was good to see you today.  I do not see or feel any evidence of cancer recurrence on your exam.  If you are unable to schedule a 26-month follow-up with Dr. Rosalva, please reach out to our clinic and we will get you into see Melissa.  We will otherwise see you for follow-up in 12 months.  Please keep a diary of when you feel the discomfort in your right abdomen and whether this seems to be related to eating cheese or any other food intake/constipation.  As always, if you develop any new and concerning symptoms before your next visit, please call to see me sooner.

## 2024-03-31 NOTE — Progress Notes (Signed)
 Gynecologic Oncology Return Clinic Visit  03/31/24  Reason for Visit: surveillance   Treatment History: The patient reports a change in bleeding in 2022-2023. She would have months with no menses and then have some menses that would last for 8-21 days. When she bled, menses were often quite heavy with passage of clots. She required multiple transfusions in the year prior to surgery. She denies any change in weight during this period. She describes having had brief, intermittent episodes of a sharp contraction-like pain in her vagina.   Pelvic ultrasound exam at [facility] on 03/25/2022 revealed a uterus measuring 16.7 x 11.1 x 10.1 cm.  Multiple fibroids noted measuring up to 4.6 cm.  Ovary normal in appearance, left ovary not visualized but a left hypoechoic solid mass measuring 7 x 6 x 5.9 cm noted with some blood flow around the mass.  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  Endometrial biopsy in mid August revealed proliferative endometrium, no hyperplasia or malignancy.   Preoperative labs included a normal CA125 (8.3).   On 06/25/22, the patient underwent robotic total hysterectomy and BSO.  This was performed in the setting of uterine fibroids, abnormal uterine bleeding, and an adnexal mass.  Findings at the time of surgery included an 18-week size uterus with, normal right ovary, left ovary with 7 cm mass.  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  Specimen was received disrupted but no surface involvement noted.  Pelvic washings were negative.   07/24/22: Inhibin B < 7, AMH 1.07   CT A/P on 08/11/22: No acute process or evidence of metastatic disease in the abdomen or pelvis.  Hepatomegaly.  Interval History: Doing well.  Reports occasional right lower abdominal pain, not bothersome enough that she takes occasion for this.  Notes some episodes of constipation if she  eats cheese.  Is unsure whether the discomfort that she feels is related to times when she has eaten cheese or other foods that cause constipation.  Denies any vaginal bleeding.  Reports baseline bladder function.  Past Medical/Surgical History: Past Medical History:  Diagnosis Date   Anemia 01/14/2017   hx of blood transfusion on 02/16/22   Asthma    see 05/11/22 PFT in Epic, follows with Dr. Verdon Gore at [facility], LOV 05/11/22 in Epic as of 06/24/22.   Fibroids    GERD (gastroesophageal reflux disease)    Follows with  Gastroenterology, LOV 04/28/22 w/ Greig Spearman, PA as of 06/24/22.   Granulosa cell tumor of left ovary    Herpes    never had symptoms   History of hiatal hernia 05/19/2022   1 cm hiatal hernia found during EGD   Hyperlipidemia    Hypertension    Follows w/ [facility] Chrystal [facility].   TIA (transient ischemic attack) 01/27/2022   Possible TIA vs food allergy. Pt experienced ataxia, slurred speech, perioral numbness and tingling while eating breakfast. CT and MRI of head were negative for a stroke. See 01/27/22 ED visit in Epic. Patient follows w/ [facility], LOV 05/21/22 w/ Harlene Bogaert, NP (as of 06/24/22.)   Wears glasses     Past Surgical History:  Procedure Laterality Date   BREAST BIOPSY Bilateral    COLONOSCOPY  02/28/2020   [facility]. Dr. Renaye Sous. The entire portion of the colon appeared normal.   ROBOTIC ASSISTED LAPAROSCOPIC HYSTERECTOMY AND  SALPINGECTOMY Bilateral 07/07/2022   Procedure: XI ROBOTIC ASSISTED LAPAROSCOPIC HYSTERECTOMY AND BILATERAL SALPINGO-OOPHORECTOMY;  Surgeon: Rosalva Sawyer, MD;  Location: [facility];  Service: Gynecology;  Laterality: Bilateral;   TUBAL LIGATION  1991   UPPER GASTROINTESTINAL ENDOSCOPY  2023    Family History  Adopted: Yes  Problem Relation Age of Onset   Breast cancer Mother        in 69's   Diabetes Mother    Hypertension Mother    Asthma  Daughter    Diabetes Maternal Aunt    Hypertension Maternal Aunt    Alzheimer's disease Maternal Grandmother    Lung disease Neg Hx     Social History   Socioeconomic History   Marital status: Single    Spouse name: Not on file   Number of children: 3   Years of education: Not on file   Highest education level: Not on file  Occupational History   Not on file  Tobacco Use   Smoking status: Former    Current packs/day: 0.00    Average packs/day: 1 pack/day for 20.0 years (20.0 ttl pk-yrs)    Types: Cigarettes    Start date: 03/18/1995    Quit date: 03/18/2015    Years since quitting: 9.0    Passive exposure: Never   Smokeless tobacco: Never  Vaping Use   Vaping status: Never Used  Substance and Sexual Activity   Alcohol use: Yes    Comment: about 4 drinks per week   Drug use: No   Sexual activity: Not Currently    Birth control/protection: Surgical    Comment: tubal ligation  Other Topics Concern   Not on file  Social History Narrative   ** Merged History Encounter **       Social Drivers of Health   Financial Resource Strain: Not on file  Food Insecurity: No Food Insecurity (04/15/2023)   Hunger Vital Sign    Worried About Running Out of Food in the Last Year: Never true    Ran Out of Food in the Last Year: Never true  Transportation Needs: No Transportation Needs (04/15/2023)   PRAPARE - Administrator, Civil Service (Medical): No    Lack of Transportation (Non-Medical): No  Physical Activity: Inactive (04/15/2023)   Exercise Vital Sign    Days of Exercise per Week: 0 days    Minutes of Exercise per Session: 0 min  Stress: Not on file  Social Connections: Unknown (12/30/2021)   Received from [facility]   Social Network    Social Network: Not on file    Current Medications:  Current Outpatient Medications:    albuterol  (PROVENTIL  HFA;VENTOLIN  HFA) 108 (90 Base) MCG/ACT inhaler, Inhale 2 puffs into the lungs every 6 (six) hours as needed for  wheezing or shortness of breath. (Patient not taking: Reported on 01/20/2024), Disp: 1 Inhaler, Rfl: 1   amLODipine  (NORVASC ) 5 MG tablet, TAKE 1 TABLET BY MOUTH ONCE DAILY, Disp: 90 tablet, Rfl: 1   esomeprazole (NEXIUM) 20 MG capsule, Take 20 mg by mouth daily., Disp: , Rfl:    nebivolol  (BYSTOLIC ) 10 MG tablet, Take 1 tablet (10 mg total) by mouth daily., Disp: 90 tablet, Rfl: 1   spironolactone  (ALDACTONE ) 25 MG tablet, Take 1 tablet (25 mg total) by mouth daily., Disp: 90 tablet, Rfl: 1   traZODone (DESYREL) 50 MG tablet, Take 50 mg by mouth at bedtime as needed., Disp: , Rfl:    valsartan  (DIOVAN ) 320 MG tablet, Take  1 tablet (320 mg total) by mouth daily., Disp: 90 tablet, Rfl: 1  Review of Systems: + vaginal discharge Denies appetite changes, fevers, chills, fatigue, unexplained weight changes. Denies hearing loss, neck lumps or masses, mouth sores, ringing in ears or voice changes. Denies cough or wheezing.  Denies shortness of breath. Denies chest pain or palpitations. Denies leg swelling. Denies abdominal distention, pain, blood in stools, constipation, diarrhea, nausea, vomiting, or early satiety. Denies pain with intercourse, dysuria, frequency, hematuria or incontinence. Denies hot flashes, pelvic pain, vaginal bleeding.   Denies joint pain, back pain or muscle pain/cramps. Denies itching, rash, or wounds. Denies dizziness, headaches, numbness or seizures. Denies swollen lymph nodes or glands, denies easy bruising or bleeding. Denies anxiety, depression, confusion, or decreased concentration.  Physical Exam: BP 137/68 (BP Location: Right Arm, Patient Position: Sitting)   Pulse 61   Temp 97.6 F (36.4 C) (Oral)   Resp 20   Wt 217 lb (98.4 kg)   LMP 06/24/2022 (Exact Date)   SpO2 100%   BMI 33.99 kg/m  General: Alert, oriented, no acute distress.  HEENT: Normocephalic, atraumatic. Sclera anicteric.  Chest: Clear to auscultation bilaterally. No wheezes, rhonchi, or  rales. Cardiovascular: Regular rate and rhythm, no murmurs, rubs, or gallops.  Abdomen: Obese. Normoactive bowel sounds. Soft, nondistended, nontender to palpation. No masses or hepatosplenomegaly appreciated. No palpable fluid wave.  Well healed incisions. Extremities: Grossly normal range of motion. Warm, well perfused. No edema bilaterally.  Skin: No rashes or lesions.  Lymphatics: No cervical, supraclavicular, or inguinal adenopathy.  GU: Normal-appearing external female genitalia.  Speculum exam reveals well rugated vaginal mucosa, no lesions. On bimanual exam, no masses or nodularity appreciated.  Laboratory & Radiologic Studies: None new  Assessment & Plan: Shari Gibson is a 58 y.o. woman with an incidental diagnosis of adult-type granulosa cell tumor of the ovary diagnosed after hysterectomy. Presumed stage IA. CT A/P post-op in 07/2022 negative for metastatic disease.   Patient is overall doing well, NED on exam today.     Plan for Inhibin B today.   I have asked her to keep a diary so that when she has episodes of abdominal discomfort, she can note whether she has eaten cheese or other foods that can contribute to constipation.  In terms of surveillance, given low-risk disease, we discussed NCCN surveillance recommendations for visits every 6 months alternating between my office and the patient's OBGYN. Her next visit will be with her OBGYN. I will see her back in 1 year. We will plan for an exam and tumor markers at each visit.   20 minutes of total time was spent for this patient encounter, including preparation, face-to-face counseling with the patient and coordination of care, and documentation of the encounter.  Comer Dollar, MD  Division of Gynecologic Oncology  Department of Obstetrics and Gynecology  [facility]

## 2024-04-03 ENCOUNTER — Encounter (HOSPITAL_BASED_OUTPATIENT_CLINIC_OR_DEPARTMENT_OTHER)

## 2024-04-04 LAB — INHIBIN B: Inhibin B: 20.5 pg/mL

## 2024-04-05 ENCOUNTER — Ambulatory Visit: Payer: Self-pay | Admitting: Gynecologic Oncology

## 2024-04-05 DIAGNOSIS — D3912 Neoplasm of uncertain behavior of left ovary: Secondary | ICD-10-CM

## 2024-04-06 ENCOUNTER — Telehealth: Payer: Self-pay | Admitting: *Deleted

## 2024-04-06 ENCOUNTER — Telehealth: Payer: Self-pay | Admitting: Gynecologic Oncology

## 2024-04-06 NOTE — Telephone Encounter (Signed)
 Called patient to discuss inhibin B and plan for CT (scheduled today by my office). No answer. Left VM.  Comer Dollar MD Gynecologic Oncology

## 2024-04-06 NOTE — Telephone Encounter (Signed)
 Per Dr Viktoria, patient scheduled for a CT scan on 8/29. Patient aware of date and time of the appt, along with instructions

## 2024-04-10 ENCOUNTER — Telehealth: Payer: Self-pay | Admitting: *Deleted

## 2024-04-10 ENCOUNTER — Encounter (HOSPITAL_BASED_OUTPATIENT_CLINIC_OR_DEPARTMENT_OTHER): Payer: Self-pay | Admitting: Family

## 2024-04-10 NOTE — Telephone Encounter (Signed)
 Spoke with [employer] stating patient is switching her CT scan to [employer] [phone]. Order has been printed for providers to sign and then order for CT abd/pelvis will be faxed to [facility].

## 2024-04-11 ENCOUNTER — Telehealth: Payer: Self-pay | Admitting: *Deleted

## 2024-04-11 NOTE — Telephone Encounter (Signed)
 Patient returned call from the office. She is aware that orders for CT scan have been faxed to [facility]. Advised patient once she has a scheduled appointment for her CT scan to call the office back and let us  know. Pt verbalized understanding and thanked the office for calling.

## 2024-04-11 NOTE — Telephone Encounter (Signed)
 Attempted to reach patient in regards to her request and [employer] that her CT scan that has been ordered by Dr. Viktoria be scheduled with [facility] in [facility]. Order has been sent for authorization, signed and faxed to [facility] at [phone], left voicemail requesting call back to [phone].

## 2024-04-13 ENCOUNTER — Encounter (HOSPITAL_BASED_OUTPATIENT_CLINIC_OR_DEPARTMENT_OTHER): Admitting: Family

## 2024-04-14 ENCOUNTER — Ambulatory Visit (HOSPITAL_COMMUNITY)

## 2024-04-16 IMAGING — CT CT HEAD CODE STROKE
3 series · 14 of 47 positions shown, 16 images · non-contrast
Comparison: None Available.

CLINICAL DATA: Code stroke.  Neuro deficit, acute, stroke suspected



[Series 2: head 5.0 st · axial · 0.45mm/px · z∈[-91,+44]mm · 8 of 33 slices shown, 10 images]
[im 3/33  brain]
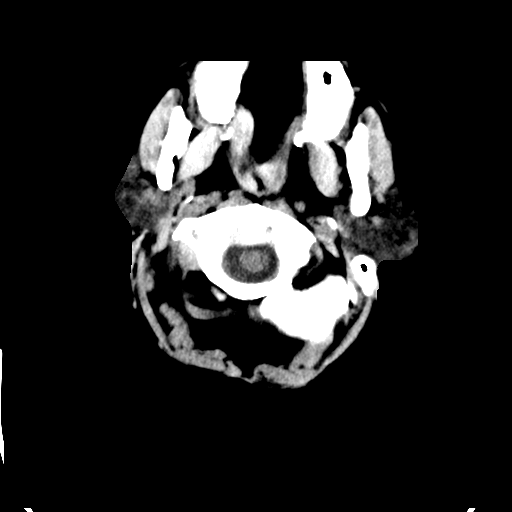
[im 3/33  bone]
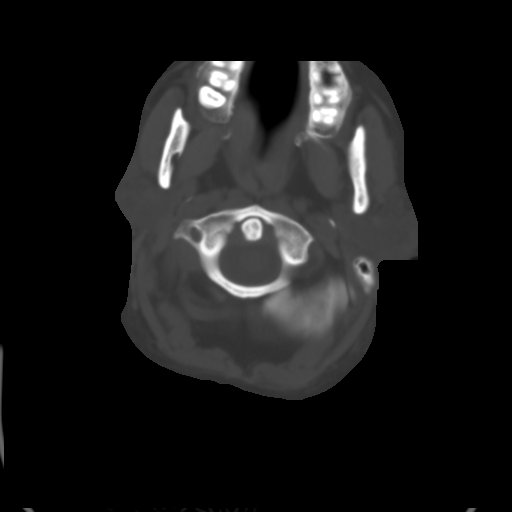
[im 7/33  brain]
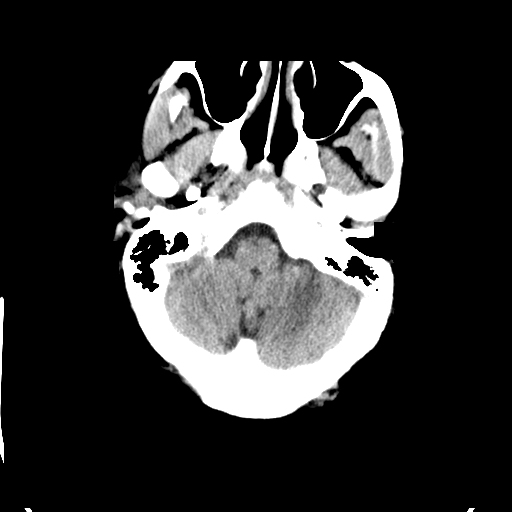
[im 10/33  brain]
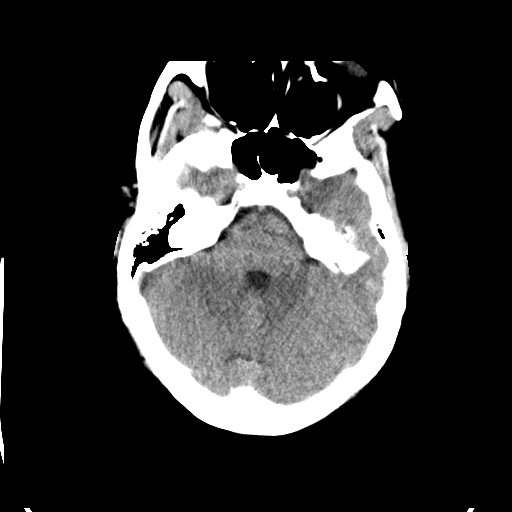
[im 15/33  brain]
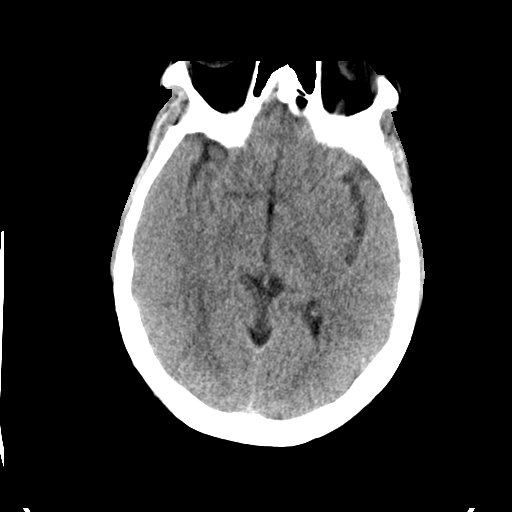
[im 18/33  brain]
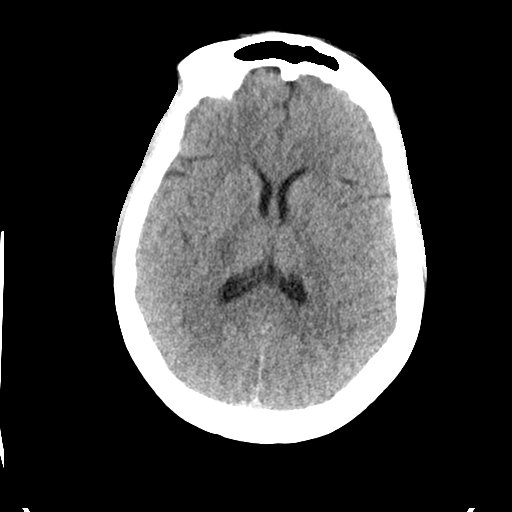
[im 18/33  bone]
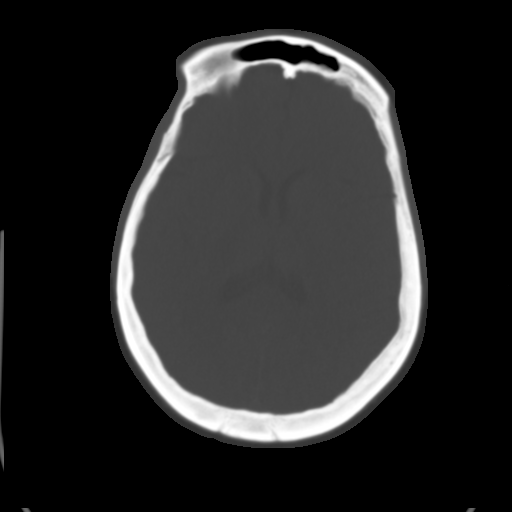
[im 23/33  brain]
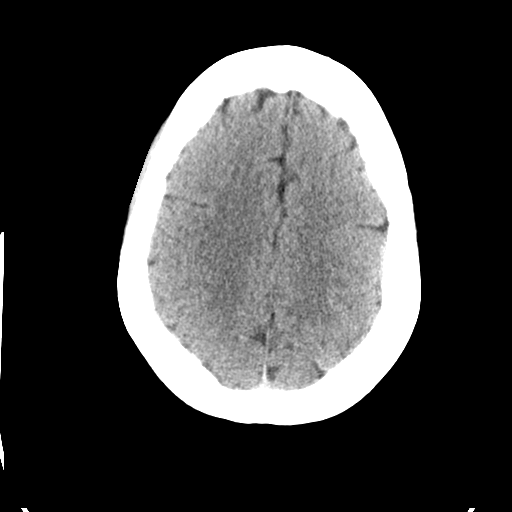
[im 26/33  brain]
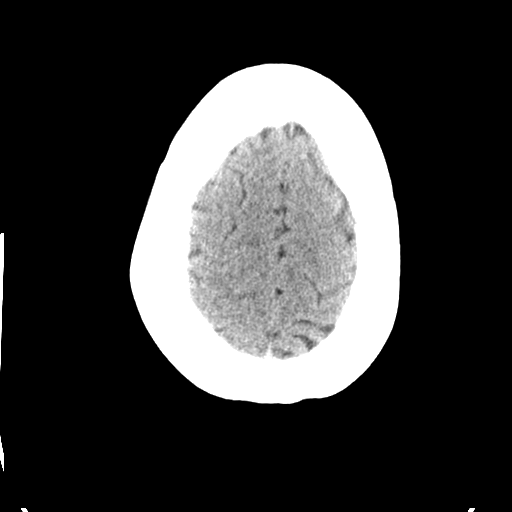
[im 30/33  brain]
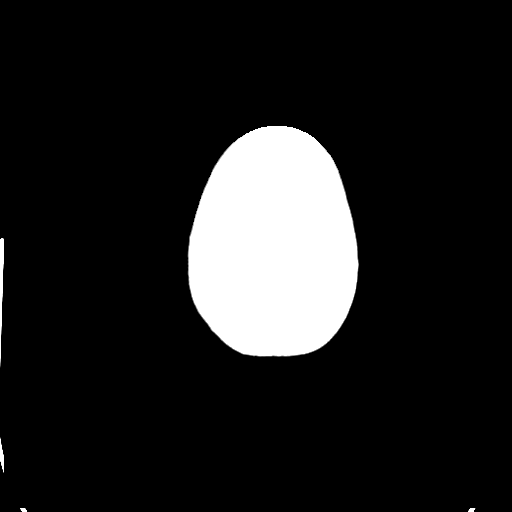

[Series 5: head 3.0 cor st · coronal · 0.31mm/px · 3 of 71 slices shown]
[im 24/71  brain]
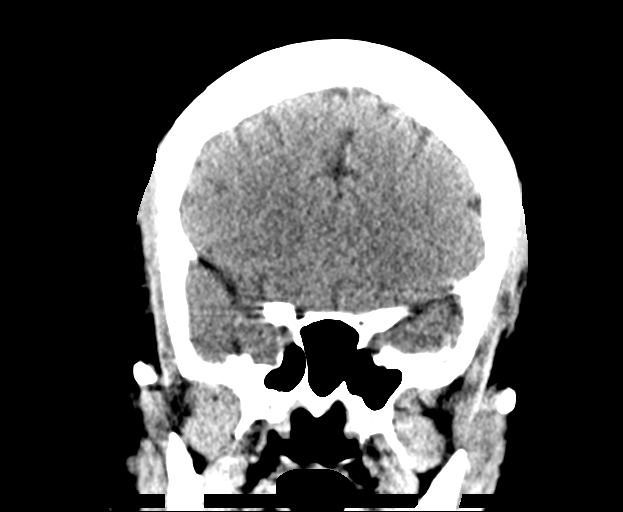
[im 32/71  brain]
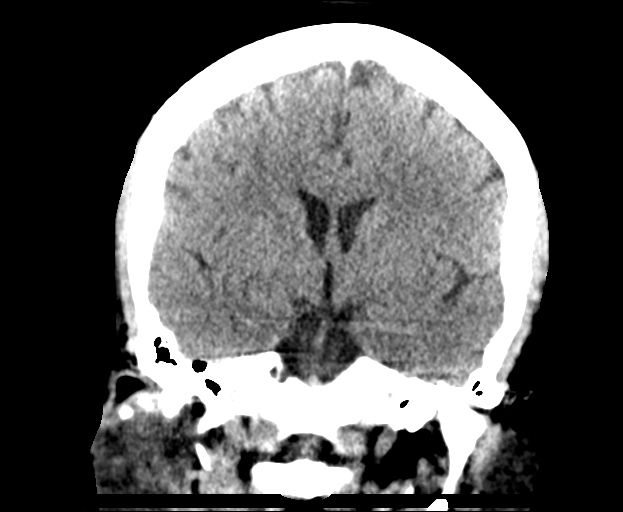
[im 39/71  brain]
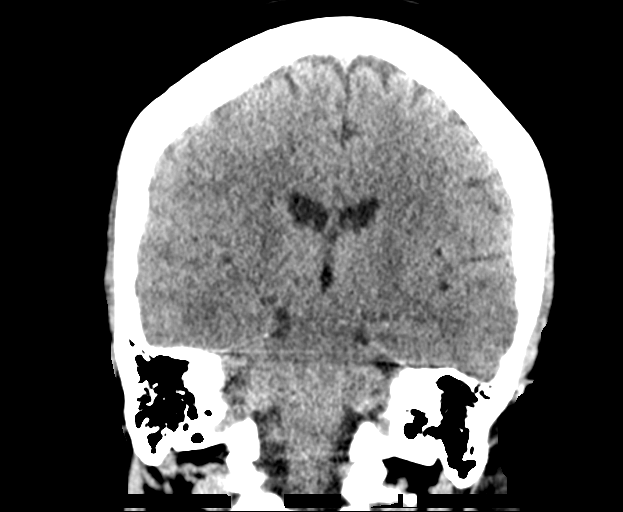

[Series 6: head 3.0 sag st · sagittal · 0.31mm/px · 3 of 65 slices shown]
[im 22/65  brain]
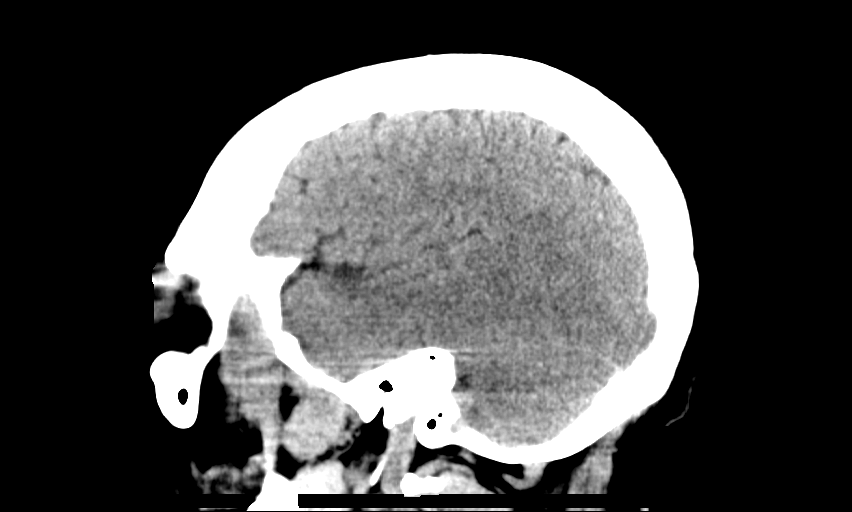
[im 33/65  brain]
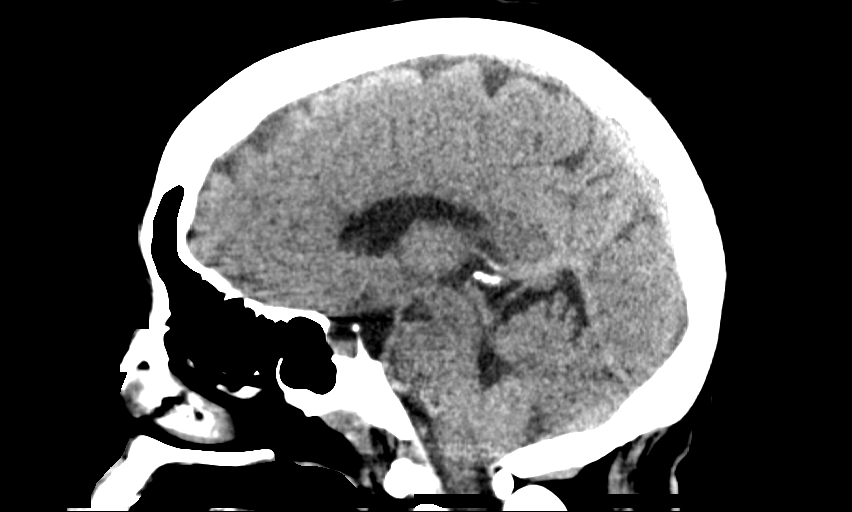
[im 43/65  brain]
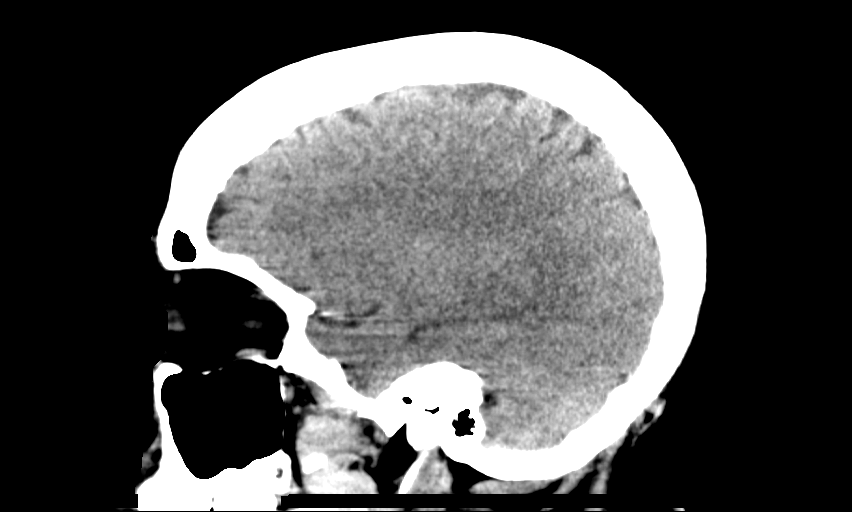

[14 of 47 positions shown; findings below may reference images not displayed]

FINDINGS: Brain: No acute intracranial hemorrhage, mass effect, or edema.
Ventricles and sulci are normal in size and configuration.
Gray-white differentiation is preserved.

Vascular: No hyperdense vessel.

Skull: Unremarkable.

Sinuses/Orbits: No acute abnormality.

Other: Mastoid air cells are clear.

ASPECTS (Alberta Stroke Program Early CT Score)

- Ganglionic level infarction (caudate, lentiform nuclei, internal
capsule, insula, M1-M3 cortex): 7

- Supraganglionic infarction (M4-M6 cortex): 3

Total score (0-10 with 10 being normal): 10
IMPRESSION: There is no acute intracranial hemorrhage or evidence of acute
infarction. ASPECT score is 10.

These results were communicated to Dr. Dleke at [DATE] on
01/27/2022 by text page via the [employer] messaging system.

## 2024-04-18 NOTE — Telephone Encounter (Signed)
 Spoke with patient this morning. She states, I have an appointment this Friday 9/5 at 3pm with [facility] for my ct scan.

## 2024-04-26 ENCOUNTER — Other Ambulatory Visit: Payer: Self-pay

## 2024-04-26 ENCOUNTER — Inpatient Hospital Stay
Admission: RE | Admit: 2024-04-26 | Discharge: 2024-04-26 | Disposition: A | Discharge status: Home / Self Care | Payer: Self-pay | Source: Ambulatory Visit | Attending: Gynecologic Oncology | Admitting: Gynecologic Oncology

## 2024-04-26 DIAGNOSIS — C569 Malignant neoplasm of unspecified ovary: Secondary | ICD-10-CM

## 2024-04-28 ENCOUNTER — Encounter: Payer: Self-pay | Admitting: Gynecologic Oncology

## 2024-06-19 ENCOUNTER — Other Ambulatory Visit: Payer: Self-pay | Admitting: Family Medicine

## 2024-06-19 DIAGNOSIS — Z1231 Encounter for screening mammogram for malignant neoplasm of breast: Secondary | ICD-10-CM

## 2024-07-07 ENCOUNTER — Ambulatory Visit

## 2024-07-07 DIAGNOSIS — Z1231 Encounter for screening mammogram for malignant neoplasm of breast: Secondary | ICD-10-CM

## 2024-07-19 ENCOUNTER — Encounter (HOSPITAL_BASED_OUTPATIENT_CLINIC_OR_DEPARTMENT_OTHER): Admitting: Family

## 2024-08-01 ENCOUNTER — Institutional Professional Consult (permissible substitution) (INDEPENDENT_AMBULATORY_CARE_PROVIDER_SITE_OTHER): Admitting: Physician Assistant

## 2024-08-03 ENCOUNTER — Inpatient Hospital Stay: Admission: RE | Admit: 2024-08-03 | Discharge: 2024-08-03 | Attending: Family Medicine

## 2024-08-03 DIAGNOSIS — Z1231 Encounter for screening mammogram for malignant neoplasm of breast: Secondary | ICD-10-CM

## 2024-08-16 ENCOUNTER — Institutional Professional Consult (permissible substitution) (INDEPENDENT_AMBULATORY_CARE_PROVIDER_SITE_OTHER): Admitting: Physician Assistant

## 2024-08-29 ENCOUNTER — Ambulatory Visit (INDEPENDENT_AMBULATORY_CARE_PROVIDER_SITE_OTHER): Admitting: Physician Assistant

## 2024-08-29 ENCOUNTER — Encounter (INDEPENDENT_AMBULATORY_CARE_PROVIDER_SITE_OTHER): Payer: Self-pay | Admitting: Physician Assistant

## 2024-08-29 VITALS — BP 178/117 | HR 75 | Ht 67.0 in | Wt 216.0 lb

## 2024-08-29 DIAGNOSIS — H9313 Tinnitus, bilateral: Secondary | ICD-10-CM | POA: Diagnosis not present

## 2024-08-29 DIAGNOSIS — J310 Chronic rhinitis: Secondary | ICD-10-CM

## 2024-08-29 DIAGNOSIS — H9193 Unspecified hearing loss, bilateral: Secondary | ICD-10-CM

## 2024-08-29 DIAGNOSIS — R09A2 Foreign body sensation, throat: Secondary | ICD-10-CM

## 2024-08-29 MED ORDER — LORATADINE 10 MG PO TABS: 10.0000 mg | ORAL_TABLET | Freq: Every day | ORAL | 11 refills | Status: AC

## 2024-08-29 MED ORDER — FLUTICASONE PROPIONATE 50 MCG/ACT NA SUSP: 2.0000 | Freq: Every day | NASAL | 6 refills | Status: AC

## 2024-08-30 NOTE — Progress Notes (Signed)
 Dear Dr. Chrystal, Here is my assessment for our mutual patient, Shari Gibson. Thank you for allowing me the opportunity to care for your patient. Please do not hesitate to contact me should you have any other questions. Sincerely, Chyrl Cohen PA-C  Otolaryngology Clinic Note Referring provider: Dr. Chrystal HPI:  Shari Gibson is a 59 y.o. female kindly referred by Dr. Chrystal   Discussed the use of AI scribe software for clinical note transcription with the patient, who gave verbal consent to proceed.  History of Present Illness   Shari Gibson is a 59 year old female with chronic rhinitis and bilateral hearing loss who presents with a one-week history of persistent foreign body sensation in the throat.  For the past week, she has experienced a constant sensation of a foreign body in her throat, described as similar to the feeling of a pill. She denies odynophagia, dysphagia, choking, or acute voice changes. She is able to swallow without difficulty. Mild morning hoarseness is noted, which resolves spontaneously. She expresses anxiety regarding potential airway compromise but has not experienced dyspnea.  She reports recent nasal congestion and a sensation of nasal obstruction, without a history of seasonal allergies.  She describes intermittent left-sided tinnitus, characterized as a brief tone similar to a hearing test, which has decreased in frequency over the past two months. She occasionally experiences left auricular pain on palpation, but denies persistent otalgia, otorrhea, history of otologic trauma, ear surgery, or tympanic membrane rupture. She perceives subjective hearing loss, corroborated by annual workplace audiometry, but denies acute changes in hearing or current ear pain.           Independent Review of Additional Tests or Records:  none   PMH/Meds/All/SocHx/FamHx/ROS:   Past Medical History:  Diagnosis Date   Anemia 01/14/2017   hx of blood  transfusion on 02/16/22   Asthma    see 05/11/22 PFT in Epic, follows with Dr. Verdon Gore at [facility], LOV 05/11/22 in Epic as of 06/24/22.   Fibroids    GERD (gastroesophageal reflux disease)    Follows with [facility], LOV 04/28/22 w/ Greig Spearman, PA as of 06/24/22.   Granulosa cell tumor of left ovary    Herpes    never had symptoms   History of hiatal hernia 05/19/2022   1 cm hiatal hernia found during EGD   Hyperlipidemia    Hypertension    Follows w/ Dr. Chrystal at [facility].   TIA (transient ischemic attack) 01/27/2022   Possible TIA vs food allergy. Pt experienced ataxia, slurred speech, perioral numbness and tingling while eating breakfast. CT and MRI of head were negative for a stroke. See 01/27/22 ED visit in Epic. Patient follows w/ [facility], LOV 05/21/22 w/ Harlene Bogaert, NP (as of 06/24/22.)   Wears glasses      Past Surgical History:  Procedure Laterality Date   BREAST BIOPSY Bilateral    COLONOSCOPY  02/28/2020   [facility]. Dr. Renaye Sous. The entire portion of the colon appeared normal.   ROBOTIC ASSISTED LAPAROSCOPIC HYSTERECTOMY AND SALPINGECTOMY Bilateral 07/07/2022   Procedure: XI ROBOTIC ASSISTED LAPAROSCOPIC HYSTERECTOMY AND BILATERAL SALPINGO-OOPHORECTOMY;  Surgeon: Rosalva Sawyer, MD;  Location: [facility];  Service: Gynecology;  Laterality: Bilateral;   TUBAL LIGATION  1991   UPPER GASTROINTESTINAL ENDOSCOPY  2023    Family History  Adopted: Yes  Problem Relation Age of Onset   Breast cancer Mother        in 47's  Diabetes Mother    Hypertension Mother    Asthma Daughter    Diabetes Maternal Aunt    Hypertension Maternal Aunt    Alzheimer's disease Maternal Grandmother    Lung disease Neg Hx      Social Connections: Unknown (12/30/2021)   Received from [employer]   Social Network    Social Network: Not on file     Current Medications[1]   Physical Exam:   BP (!)  178/117 (BP Location: Right Arm, Patient Position: Sitting)   Pulse 75   Ht 5' 7 (1.702 m)   Wt 216 lb (98 kg)   LMP 04/28/2022   SpO2 98%   BMI 33.83 kg/m   Pertinent Findings  CN II-XII grossly intact Bilateral EAC clear and TM intact with well pneumatized middle ear spaces Anterior rhinoscopy: Septum midline; bilateral inferior turbinates with no moderate hypertrophy  No lesions of oral cavity/oropharynx; dentition WNL No obviously palpable neck masses/lymphadenopathy/thyromegaly No respiratory distress or stridor      Seprately Identifiable Procedures:  None  Impression & Plans:  Shari Gibson is a 59 y.o. female with the following   Assessment and Plan    Chronic rhinitis with postnasal drainage Subacute mild symptoms with nasal airway edema and postnasal drainage, this could be causing foreign body sensation. No acute infection, low suspicion for concerning etiology.  - Prescribed Fluticasone  nasal spray. - Initiated daily Loratadine . - Recommended saline nasal spray. - Advised follow-up in one month if symptoms persist. - Advised earlier follow-up for pain, dysphagia, voice changes, or respiratory symptoms.  Bilateral hearing loss Subjective hearing loss. Chronic with no acute changes or associated symptoms. - Ordered audiometric evaluation. - Anticipatory guidance: audiology will communicate results directly if normal, and notify if further discussion is required.  Bilateral tinnitus Intermittent left-sided tinnitus, mild and improving. No associated otalgia, trauma, or concerning symptoms. Low likelihood of serious pathology. - Ordered audiometric evaluation to assess tinnitus. - Provided reassurance regarding low likelihood of serious pathology. - Advised to report worsening or new neurological symptoms; conditional plan for MRI if symptoms worsen or are associated with additional findings.        - f/u one month, sooner as needed    Thank you for  allowing me the opportunity to care for your patient. Please do not hesitate to contact me should you have any other questions.  Sincerely, Chyrl Cohen PA-C [facility] Phone: [phone] Fax: [phone]  08/30/2024, 10:01 AM        [1]  Current Outpatient Medications:    albuterol  (PROVENTIL  HFA;VENTOLIN  HFA) 108 (90 Base) MCG/ACT inhaler, Inhale 2 puffs into the lungs every 6 (six) hours as needed for wheezing or shortness of breath., Disp: 1 Inhaler, Rfl: 1   amLODipine  (NORVASC ) 5 MG tablet, TAKE 1 TABLET BY MOUTH ONCE DAILY, Disp: 90 tablet, Rfl: 1   esomeprazole (NEXIUM) 20 MG capsule, Take 20 mg by mouth daily., Disp: , Rfl:    fluticasone  (FLONASE ) 50 MCG/ACT nasal spray, Place 2 sprays into both nostrils daily., Disp: 16 g, Rfl: 6   loratadine  (CLARITIN ) 10 MG tablet, Take 1 tablet (10 mg total) by mouth daily., Disp: 30 tablet, Rfl: 11   nebivolol  (BYSTOLIC ) 10 MG tablet, Take 1 tablet (10 mg total) by mouth daily., Disp: 90 tablet, Rfl: 1   spironolactone  (ALDACTONE ) 25 MG tablet, Take 1 tablet (25 mg total) by mouth daily., Disp: 90 tablet, Rfl: 1   traZODone (DESYREL) 50 MG tablet, Take 50 mg by  mouth at bedtime as needed., Disp: , Rfl:    valsartan  (DIOVAN ) 320 MG tablet, Take 1 tablet (320 mg total) by mouth daily., Disp: 90 tablet, Rfl: 1

## 2024-08-31 ENCOUNTER — Encounter (INDEPENDENT_AMBULATORY_CARE_PROVIDER_SITE_OTHER): Payer: Self-pay

## 2024-09-06 ENCOUNTER — Other Ambulatory Visit (HOSPITAL_BASED_OUTPATIENT_CLINIC_OR_DEPARTMENT_OTHER): Payer: Self-pay | Admitting: Family

## 2024-09-06 DIAGNOSIS — I10 Essential (primary) hypertension: Secondary | ICD-10-CM

## 2024-10-05 ENCOUNTER — Ambulatory Visit (INDEPENDENT_AMBULATORY_CARE_PROVIDER_SITE_OTHER): Admitting: Audiology

## 2024-10-05 ENCOUNTER — Ambulatory Visit (INDEPENDENT_AMBULATORY_CARE_PROVIDER_SITE_OTHER): Admitting: Physician Assistant
